# Patient Record
Sex: Female | Born: 1985
Health system: Southern US, Community
[De-identification: ages and names within clinical notes are randomized; demographics above are authoritative.]

## PROBLEM LIST (undated history)

## (undated) DIAGNOSIS — E039 Hypothyroidism, unspecified: Secondary | ICD-10-CM

## (undated) DIAGNOSIS — I10 Essential (primary) hypertension: Secondary | ICD-10-CM

## (undated) DIAGNOSIS — T7840XA Allergy, unspecified, initial encounter: Secondary | ICD-10-CM

## (undated) DIAGNOSIS — E785 Hyperlipidemia, unspecified: Secondary | ICD-10-CM

## (undated) DIAGNOSIS — E119 Type 2 diabetes mellitus without complications: Secondary | ICD-10-CM

## (undated) DIAGNOSIS — Z8619 Personal history of other infectious and parasitic diseases: Secondary | ICD-10-CM

## (undated) DIAGNOSIS — D134 Benign neoplasm of liver: Secondary | ICD-10-CM

## (undated) HISTORY — DX: Hyperlipidemia, unspecified: E78.5

## (undated) HISTORY — DX: Allergy, unspecified, initial encounter: T78.40XA

## (undated) HISTORY — DX: Personal history of other infectious and parasitic diseases: Z86.19

## (undated) HISTORY — DX: Benign neoplasm of liver: D13.4

## (undated) HISTORY — DX: Hypothyroidism, unspecified: E03.9

---
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
## 2012-08-03 ENCOUNTER — Ambulatory Visit (INDEPENDENT_AMBULATORY_CARE_PROVIDER_SITE_OTHER): Payer: 59 | Admitting: Family

## 2012-08-03 ENCOUNTER — Other Ambulatory Visit: Payer: Self-pay | Admitting: Family

## 2012-08-03 ENCOUNTER — Encounter: Payer: Self-pay | Admitting: Family

## 2012-08-03 VITALS — BP 110/90 | HR 106 | Temp 98.9°F | Resp 18 | Ht 67.5 in | Wt 273.1 lb

## 2012-08-03 DIAGNOSIS — R030 Elevated blood-pressure reading, without diagnosis of hypertension: Secondary | ICD-10-CM

## 2012-08-03 DIAGNOSIS — E282 Polycystic ovarian syndrome: Secondary | ICD-10-CM | POA: Insufficient documentation

## 2012-08-03 DIAGNOSIS — G43909 Migraine, unspecified, not intractable, without status migrainosus: Secondary | ICD-10-CM | POA: Insufficient documentation

## 2012-08-03 DIAGNOSIS — I10 Essential (primary) hypertension: Secondary | ICD-10-CM | POA: Insufficient documentation

## 2012-08-03 DIAGNOSIS — IMO0001 Reserved for inherently not codable concepts without codable children: Secondary | ICD-10-CM

## 2012-08-03 DIAGNOSIS — E785 Hyperlipidemia, unspecified: Secondary | ICD-10-CM

## 2012-08-03 DIAGNOSIS — Z0000 Encounter for general adult medical examination without abnormal findings: Secondary | ICD-10-CM

## 2012-08-03 DIAGNOSIS — R635 Abnormal weight gain: Secondary | ICD-10-CM

## 2012-08-03 LAB — BASIC METABOLIC PANEL
BUN: 11 mg/dL (ref 6–23)
Calcium: 10 mg/dL (ref 8.4–10.5)
Glucose, Bld: 107 mg/dL — ABNORMAL HIGH (ref 70–99)
Sodium: 139 mEq/L (ref 135–145)

## 2012-08-03 LAB — HEPATIC FUNCTION PANEL
AST: 23 U/L (ref 0–37)
Alkaline Phosphatase: 87 U/L (ref 39–117)
Bilirubin, Direct: 0.1 mg/dL (ref 0.0–0.3)
Total Bilirubin: 0.5 mg/dL (ref 0.3–1.2)

## 2012-08-03 LAB — TSH: TSH: 5.466 u[IU]/mL — ABNORMAL HIGH (ref 0.350–4.500)

## 2012-08-03 MED ORDER — FLUTICASONE PROPIONATE 50 MCG/ACT NA SUSP: 2.0000 | Freq: Every day | NASAL | Status: DC

## 2012-08-03 NOTE — Progress Notes (Signed)
Subjective:    Patient ID: Kristy Chung, female    DOB: [birth year], 27 y.o.   MRN: [id number]  HPI  Kristy Chung is a 27 yr old female who presents today to establish care.  Migraine- occasional migraines. Generally stress induced and relieved by excedrin and sleep helps.   Hyperlipidemia- She is fasting.  Hx of hyperlipidemia.  Obesity- BMI is 42.  She has gained 22 pounds in the last year.  She reports that in HS she has pcos. She see [facility],  She weighed 170 when she graduated HS.  She had thryoid checked last year and it was normal.  OB also did A1C last year.   Elevated BP- 120-130/upper 80's.    PCOS- told that she had PCOS.     Review of Systems  Constitutional: Positive for unexpected weight change.  HENT: Negative for hearing loss.   Eyes: Negative for visual disturbance.  Respiratory:       Reports rhinorrhea.  No improvement with otc zyrtec  Cardiovascular: Negative for leg swelling.  Gastrointestinal: Negative for vomiting, diarrhea and constipation.  Genitourinary: Negative for dysuria and frequency.  Musculoskeletal:       Some right wrist pain occasionally  Skin: Negative for rash.  Neurological: Positive for headaches.  Hematological: Negative for adenopathy.  Psychiatric/Behavioral:       Denies depression/anxiety       Past Medical History  Diagnosis Date  . History of chicken pox   . Allergy   . Hyperlipidemia   . History of migraine   . Polycystic ovarian syndrome     History   Social History  . Marital Status: Single    Spouse Name: N/A    Number of Children: N/A  . Years of Education: N/A   Occupational History  . Not on file.   Social History Main Topics  . Smoking status: Never Smoker   . Smokeless tobacco: Never Used  . Alcohol Use: Yes     Comment: occasional  . Drug Use: Not on file  . Sexually Active: Not on file   Other Topics Concern  . 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 Wisdom tooth extraction  2005    Family History  Problem Relation Age of Onset  . Hyperlipidemia Mother   . Depression Mother   . Hyperlipidemia Father   . Hypertension Father   . Hypothyroidism Maternal Grandmother   . Heart disease Maternal Grandfather     MI, stent, CAD  . Hypertension Maternal Grandfather   . Hyperlipidemia Maternal Grandfather   . COPD Paternal Grandmother   . Hypertension Paternal Grandmother   . Hyperlipidemia Paternal Grandmother   . AAA (abdominal aortic aneurysm) Paternal Grandfather   . Dementia Paternal Grandfather   . Heart disease Paternal Grandfather     Allergies  Allergen Reactions  . Other     CINNAMON GUM--blisters.    No current outpatient prescriptions on file prior to visit.   No current facility-administered medications on file prior to visit.    BP 110/90  Pulse 106  Temp(Src) 98.9 F (37.2 C) (Oral)  Resp 18  Ht 5' 7.5" (1.715 m)  Wt 273 lb 1.9 oz (  123.886 kg)  BMI 42.12 kg/m2  SpO2 99%  LMP 07/29/2012    Objective:   Physical Exam  Constitutional: She is oriented to person, place, and time. She appears well-developed and well-nourished.  Morbid obese white female, pleasant and in NAD  HENT:  Head: Normocephalic and atraumatic.  Cardiovascular: Normal rate and regular rhythm.   No murmur heard. Pulmonary/Chest: Effort normal and breath sounds normal. No respiratory distress. She has no wheezes. She has no rales. She exhibits no tenderness.  Musculoskeletal: She exhibits no edema.  Lymphadenopathy:    She has no cervical adenopathy.  Neurological: She is alert and oriented to person, place, and time.  Skin: Skin is warm and dry.  Small dark lesion noted on left breast Raised tag like skin lesion in gluteal fold with  dark center.   Psychiatric: She has a normal mood and affect. Her behavior is normal. Judgment and thought content normal.          Assessment & Plan:

## 2012-08-03 NOTE — Patient Instructions (Addendum)
Please complete your lab work prior to leaving.  Schedule mole removal at front desk. Welcome to [facility]!

## 2012-08-03 NOTE — Assessment & Plan Note (Signed)
BP is upper limit normal per report. SBP 90 today.  I recommended that the pt work hard on low sodium diet, weight loss. Will continue to monitor.

## 2012-08-03 NOTE — Assessment & Plan Note (Signed)
Obtain flp  

## 2012-08-03 NOTE — Assessment & Plan Note (Signed)
Will obtain A1C.   

## 2012-08-03 NOTE — Assessment & Plan Note (Signed)
Hx of migraines, now well controlled.  Monitor.

## 2012-08-03 NOTE — Assessment & Plan Note (Addendum)
We discussed healthy diet, exercise, weight loss.   Goal calories <1500 a day with regular exercise. She has had significant weight gain in the last 1 year.  Will also obtain TSH.

## 2012-08-09 ENCOUNTER — Telehealth: Payer: Self-pay | Admitting: Family

## 2012-08-09 DIAGNOSIS — E039 Hypothyroidism, unspecified: Secondary | ICD-10-CM

## 2012-08-09 MED ORDER — LEVOTHYROXINE SODIUM 25 MCG PO TABS: 25.0000 ug | ORAL_TABLET | Freq: Every day | ORAL | Status: DC

## 2012-08-09 MED ORDER — METFORMIN HCL 500 MG PO TABS: 500.0000 mg | ORAL_TABLET | Freq: Two times a day (BID) | ORAL | Status: DC

## 2012-08-09 NOTE — Telephone Encounter (Signed)
Pls call pt and let her know that her A1C is showing borderline diabetes at 6.3.  I would like her to resume metformin bid.  Also, TSH elevated.  Shows subclinical hypothyroid. Given recent weight gain, will rx synthroid.  She should repeat TSH in 6 weeks. (dx hypothyroid).  Cholesterol and trigs elevated. She should avoid concentrated sweets and work on exercise/weight loss.

## 2012-08-10 MED ORDER — TRUERESULT BLOOD GLUCOSE W/DEVICE KIT
PACK | Status: DC
Start: 2012-08-10 — End: 2017-09-21

## 2012-08-10 MED ORDER — LANCETS MISC: Status: DC

## 2012-08-10 MED ORDER — GLUCOSE BLOOD VI STRP: ORAL_STRIP | Status: DC

## 2012-08-10 NOTE — Telephone Encounter (Signed)
Pt left message wanting to know if and how often she should check her blood sugar?  Please advise.

## 2012-08-10 NOTE — Telephone Encounter (Signed)
Notified pt and she voices understanding. Requests rx for glucometer, test strips and lancets. Rxs sent.

## 2012-08-10 NOTE — Telephone Encounter (Signed)
She should check sugars once daily at different times of the day.  Goal AM fasting sugar is 80-110.  Goal after meals is <140.

## 2012-08-18 ENCOUNTER — Encounter: Payer: Self-pay | Admitting: Family

## 2012-08-18 ENCOUNTER — Ambulatory Visit (INDEPENDENT_AMBULATORY_CARE_PROVIDER_SITE_OTHER): Payer: 59 | Admitting: Family

## 2012-08-18 VITALS — BP 126/78 | HR 98 | Temp 99.1°F | Resp 16 | Wt 273.1 lb

## 2012-08-18 DIAGNOSIS — L988 Other specified disorders of the skin and subcutaneous tissue: Secondary | ICD-10-CM

## 2012-08-18 DIAGNOSIS — D239 Other benign neoplasm of skin, unspecified: Secondary | ICD-10-CM

## 2012-08-18 DIAGNOSIS — N649 Disorder of breast, unspecified: Secondary | ICD-10-CM

## 2012-08-18 DIAGNOSIS — L989 Disorder of the skin and subcutaneous tissue, unspecified: Secondary | ICD-10-CM

## 2012-08-18 DIAGNOSIS — D229 Melanocytic nevi, unspecified: Secondary | ICD-10-CM

## 2012-08-18 NOTE — Progress Notes (Signed)
Subjective:    Patient ID: Kristy Chung, female    DOB: 1985-07-12, 27 y.o.   MRN: [id number]  HPI   Kristy Chung is a 27 yr old female who presents today for removal of a mole on her left breast and a mole in the gluteal fold.   Review of Systems See HPI  Past Medical History  Diagnosis Date  . History of chicken pox   . Allergy   . Hyperlipidemia   . History of migraine   . Polycystic ovarian syndrome     History   Social History  . Marital Status: Single    Spouse Name: N/A    Number of Children: N/A  . Years of Education: N/A   Occupational History  . Not on file.   Social History Main Topics  . Smoking status: Never Smoker   . Smokeless tobacco: Never Used  . Alcohol Use: Yes     Comment: occasional  . Drug Use: Not on file  . Sexually Active: Not on file   Other Topics Concern  . 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 Wisdom tooth extraction  2005    Family History  Problem Relation Age of Onset  . Hyperlipidemia Mother   . Depression Mother   . Hyperlipidemia Father   . Hypertension Father   . Hypothyroidism Maternal Grandmother   . Heart disease Maternal Grandfather     MI, stent, CAD  . Hypertension Maternal Grandfather   . Hyperlipidemia Maternal Grandfather   . COPD Paternal Grandmother   . Hypertension Paternal Grandmother   . Hyperlipidemia Paternal Grandmother   . AAA (abdominal aortic aneurysm) Paternal Grandfather   . Dementia Paternal Grandfather   . Heart disease Paternal Grandfather     Allergies  Allergen Reactions  . Other     CINNAMON GUM--blisters.    Current Outpatient Prescriptions on File Prior to Visit  Medication Sig Dispense Refill  . Blood Glucose Monitoring Suppl (TRUERESULT BLOOD GLUCOSE)  W/DEVICE KIT Use as directed to check blood sugar  DX 790.29  1 each  0  . fluticasone (FLONASE) 50 MCG/ACT nasal spray Place 2 sprays into the nose daily.  16 g  6  . glucose blood test strip TrueResult test strips  Use as instructed once a day to check blood sugar DX 790.29  50 each  2  . Lancets MISC Use to check blood sugar once a day with TrueResult meter.  DX 790.29  100 each  2  . levonorgestrel-ethinyl estradiol (NORDETTE) 0.15-30 MG-MCG tablet Take 1 tablet by mouth daily.      Marland Kitchen levothyroxine (SYNTHROID, LEVOTHROID) 25 MCG tablet Take 1 tablet (25 mcg total) by mouth daily.  30 tablet  1  . Melatonin 5 MG TABS Take 1 tablet by mouth at bedtime as needed.      . metFORMIN (GLUCOPHAGE) 500 MG tablet Take 1 tablet (500 mg total) by mouth 2 (two) times daily with a meal.  60 tablet  3  . Multiple Vitamin (MULTIVITAMIN) tablet Take 1 tablet by mouth daily.      . Omega-3 Fatty Acids (FISH OIL) 300 MG CAPS Take 300 mg by mouth daily.      Marland Kitchen  vitamin B-12 (CYANOCOBALAMIN) 1000 MCG tablet Take 1,000 mcg by mouth daily.       No current facility-administered medications on file prior to visit.    BP 126/78  Pulse 98  Temp(Src) 99.1 F (37.3 C) (Oral)  Resp 16  Wt 273 lb 1.9 oz (123.886 kg)  BMI 42.12 kg/m2  SpO2 99%  LMP 07/29/2012       Objective:   Physical Exam  Constitutional: She appears well-developed. No distress.  Skin:  Flat 2 mm wide dark mole noted left breast above areola at 11 oclock.    Raise skin colored mole in gluteal fold with dark hyperpigmented center.   Psychiatric: She has a normal mood and affect. Her behavior is normal. Judgment and thought content normal.          Assessment & Plan:

## 2012-08-18 NOTE — Assessment & Plan Note (Signed)
Procedure including risks/benefits explained to patient.  Questions were answered. After informed consent was obtained, left breast lesion was cleansed with betadine and then alcohol. 1% Lidocaine with epinephrine was injected under lesion and then shave biopsy was performed. Area was cauterized to obtain hemostasis.  Same repeated for successful removal of nevus in gluteal fold. Pt tolerated procedure well.  Specimen sent for pathology review.  Pt instructed to keep the area dry for 24 hours and to contact us if he develops redness, drainage or swelling at the site.

## 2012-08-18 NOTE — Patient Instructions (Addendum)
Please keep area dry today. Call if redness or drainage occurs. We will contact you with your results.

## 2012-08-22 ENCOUNTER — Telehealth: Payer: Self-pay | Admitting: Family

## 2012-08-22 DIAGNOSIS — D239 Other benign neoplasm of skin, unspecified: Secondary | ICD-10-CM

## 2012-08-22 NOTE — Telephone Encounter (Signed)
Results reviewed with patient.  Will refer to dermatology for further evaluation.

## 2012-08-22 NOTE — Telephone Encounter (Signed)
Reviewed Path report from Mole removal.  Left message for pt to return my call.

## 2012-09-01 ENCOUNTER — Encounter: Payer: Self-pay | Admitting: Family

## 2012-09-04 ENCOUNTER — Ambulatory Visit (INDEPENDENT_AMBULATORY_CARE_PROVIDER_SITE_OTHER): Payer: 59 | Admitting: Family

## 2012-09-04 ENCOUNTER — Encounter: Payer: Self-pay | Admitting: Family

## 2012-09-04 VITALS — BP 124/84 | HR 95 | Temp 98.1°F | Resp 16 | Wt 275.1 lb

## 2012-09-04 DIAGNOSIS — N61 Mastitis without abscess: Secondary | ICD-10-CM

## 2012-09-04 MED ORDER — DOXYCYCLINE HYCLATE 100 MG PO TABS: 100.0000 mg | ORAL_TABLET | Freq: Two times a day (BID) | ORAL | Status: DC

## 2012-09-04 NOTE — Assessment & Plan Note (Signed)
Will rx with doxy for empiric MRSA coverage.  Pt instructed to monitor area and contact if increased pain, swelling redness.  Follow up in week.

## 2012-09-04 NOTE — Progress Notes (Signed)
Subjective:    Patient ID: Kristy Chung, female    DOB: [birth year], 27 y.o.   MRN: [id number]  HPI  Kristy Chung is a 27 yr old female who presents today to discuss ?cellulitis of the left breast. She initially underwent biopsy on 4/4.  Path showed dysplastic nevus with atypia. She was subsequently referred to dermatology for a wider excision.  She saw Dr. Vernell Barrier who performed wide excision last Monday.  This past Saturday she developed purulent drainage and has now developed redness/swelling and fullness of the left breast. Denies associated fever.    Review of Systems See HPI  Past Medical History  Diagnosis Date  . History of chicken pox   . Allergy   . Hyperlipidemia   . History of migraine   . Polycystic ovarian syndrome     History   Social History  . Marital Status: Single    Spouse Name: N/A    Number of Children: N/A  . Years of Education: N/A   Occupational History  . Not on file.   Social History Main Topics  . Smoking status: Never Smoker   . Smokeless tobacco: Never Used  . Alcohol Use: Yes     Comment: occasional  . Drug Use: Not on file  . Sexually Active: Not on file   Other Topics Concern  . 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 Wisdom tooth extraction  2005    Family History  Problem Relation Age of Onset  . Hyperlipidemia Mother   . Depression Mother   . Hyperlipidemia Father   . Hypertension Father   . Hypothyroidism Maternal Grandmother   . Heart disease Maternal Grandfather     MI, stent, CAD  . Hypertension Maternal Grandfather   . Hyperlipidemia Maternal Grandfather   . COPD Paternal Grandmother   . Hypertension Paternal Grandmother   . Hyperlipidemia Paternal Grandmother   . AAA (abdominal aortic aneurysm)  Paternal Grandfather   . Dementia Paternal Grandfather   . Heart disease Paternal Grandfather     Allergies  Allergen Reactions  . Other     CINNAMON GUM--blisters.    Current Outpatient Prescriptions on File Prior to Visit  Medication Sig Dispense Refill  . Blood Glucose Monitoring Suppl (TRUERESULT BLOOD GLUCOSE) W/DEVICE KIT Use as directed to check blood sugar  DX 790.29  1 each  0  . fluticasone (FLONASE) 50 MCG/ACT nasal spray Place 2 sprays into the nose daily.  16 g  6  . glucose blood test strip TrueResult test strips  Use as instructed once a day to check blood sugar DX 790.29  50 each  2  . Lancets MISC Use to check blood sugar once a day with TrueResult meter.  DX 790.29  100 each  2  . levonorgestrel-ethinyl estradiol (NORDETTE) 0.15-30 MG-MCG tablet Take 1 tablet by mouth daily.      Marland Kitchen levothyroxine (SYNTHROID, LEVOTHROID) 25 MCG tablet Take 1 tablet (25 mcg total) by mouth daily.  30 tablet  1  . Melatonin 5 MG TABS Take 1 tablet by mouth at bedtime as needed.      . metFORMIN (GLUCOPHAGE) 500 MG tablet Take 1 tablet (500 mg total) by mouth 2 (two) times  daily with a meal.  60 tablet  3  . Multiple Vitamin (MULTIVITAMIN) tablet Take 1 tablet by mouth daily.      . Omega-3 Fatty Acids (FISH OIL) 300 MG CAPS Take 300 mg by mouth daily.      . vitamin B-12 (CYANOCOBALAMIN) 1000 MCG tablet Take 1,000 mcg by mouth daily.       No current facility-administered medications on file prior to visit.    BP 124/84  Pulse 95  Temp(Src) 98.1 F (36.7 C) (Oral)  Resp 16  Wt 275 lb 1.3 oz (124.775 kg)  BMI 42.42 kg/m2  SpO2 99%       Objective:   Physical Exam  Constitutional: She is oriented to person, place, and time. She appears well-developed and well-nourished. No distress.  HENT:  Head: Normocephalic and atraumatic.  Pulmonary/Chest: Effort normal.  Neurological: She is alert and oriented to person, place, and time.  Skin:  Left breast ulcerated lesion with yellow  base, no odor.  + associated erythema and tenderness surrounding lesion- approximately 2 inches in diameter.    Psychiatric: She has a normal mood and affect. Her behavior is normal. Judgment and thought content normal.          Assessment & Plan:

## 2012-09-04 NOTE — Patient Instructions (Addendum)
Please call if symptoms worsen, if you develop fever or if not improved in 2-3 days.

## 2012-09-04 NOTE — Telephone Encounter (Signed)
Pt was seen in the office today and stated swelling and discharged has cleared up. Requested that we discard previous message.

## 2012-09-11 ENCOUNTER — Encounter: Payer: Self-pay | Admitting: Family

## 2012-09-11 ENCOUNTER — Ambulatory Visit (INDEPENDENT_AMBULATORY_CARE_PROVIDER_SITE_OTHER): Payer: 59 | Admitting: Family

## 2012-09-11 VITALS — BP 134/80 | HR 83 | Temp 98.3°F | Resp 16 | Ht 67.5 in | Wt 274.0 lb

## 2012-09-11 DIAGNOSIS — N61 Mastitis without abscess: Secondary | ICD-10-CM

## 2012-09-11 NOTE — Patient Instructions (Addendum)
Please call if wound does not continue to improve or if you develop recurrent pain or redness.

## 2012-09-11 NOTE — Assessment & Plan Note (Signed)
Resolved.  Plan to complete doxy.  Pt to call if wound does not continue to heal or if recurrent erythema.

## 2012-09-11 NOTE — Progress Notes (Signed)
Subjective:    Patient ID: Kristy Chung, female    DOB: 1985-06-13, 27 y.o.   MRN: [id number]  HPI  Kristy Chung is a 27 yr old female who presents today for 1 week follow up of her left breast cellulitis.  Last visit she presented with pain/redness of the left breast following a wide excision of a dysplastic nevus by dermatology.  She was started on doxycycline. She tells me that dermatology called her to notify her that her biopsy was "clean."  Review of Systems See HPI  Past Medical History  Diagnosis Date  . History of chicken pox   . Allergy   . Hyperlipidemia   . History of migraine   . Polycystic ovarian syndrome     History   Social History  . Marital Status: Single    Spouse Name: N/A    Number of Children: N/A  . Years of Education: N/A   Occupational History  . Not on file.   Social History Main Topics  . Smoking status: Never Smoker   . Smokeless tobacco: Never Used  . Alcohol Use: Yes     Comment: occasional  . Drug Use: Not on file  . Sexually Active: Not on file   Other Topics Concern  . 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 Wisdom tooth extraction  2005    Family History  Problem Relation Age of Onset  . Hyperlipidemia Mother   . Depression Mother   . Hyperlipidemia Father   . Hypertension Father   . Hypothyroidism Maternal Grandmother   . Heart disease Maternal Grandfather     MI, stent, CAD  . Hypertension Maternal Grandfather   . Hyperlipidemia Maternal Grandfather   . COPD Paternal Grandmother   . Hypertension Paternal Grandmother   . Hyperlipidemia Paternal Grandmother   . AAA (abdominal aortic aneurysm) Paternal Grandfather   . Dementia Paternal Grandfather   . Heart disease Paternal Grandfather     Allergies   Allergen Reactions  . Other     CINNAMON GUM--blisters.    Current Outpatient Prescriptions on File Prior to Visit  Medication Sig Dispense Refill  . Blood Glucose Monitoring Suppl (TRUERESULT BLOOD GLUCOSE) W/DEVICE KIT Use as directed to check blood sugar  DX 790.29  1 each  0  . doxycycline (VIBRA-TABS) 100 MG tablet Take 1 tablet (100 mg total) by mouth 2 (two) times daily.  20 tablet  0  . fluticasone (FLONASE) 50 MCG/ACT nasal spray Place 2 sprays into the nose daily.  16 g  6  . glucose blood test strip TrueResult test strips  Use as instructed once a day to check blood sugar DX 790.29  50 each  2  . Lancets MISC Use to check blood sugar once a day with TrueResult meter.  DX 790.29  100 each  2  . levonorgestrel-ethinyl estradiol (NORDETTE) 0.15-30 MG-MCG tablet Take 1 tablet by mouth daily.      Marland Kitchen levothyroxine (SYNTHROID, LEVOTHROID) 25 MCG tablet Take 1 tablet (25 mcg total) by mouth daily.  30 tablet  1  . Melatonin 5 MG TABS Take 1 tablet by mouth at bedtime as needed.      . metFORMIN (GLUCOPHAGE) 500 MG tablet  Take 1 tablet (500 mg total) by mouth 2 (two) times daily with a meal.  60 tablet  3  . Multiple Vitamin (MULTIVITAMIN) tablet Take 1 tablet by mouth daily.      . Omega-3 Fatty Acids (FISH OIL) 300 MG CAPS Take 300 mg by mouth daily.      . vitamin B-12 (CYANOCOBALAMIN) 1000 MCG tablet Take 1,000 mcg by mouth daily.       No current facility-administered medications on file prior to visit.    Ht 5' 7.5" (1.715 m)  Wt 274 lb (124.286 kg)  BMI 42.26 kg/m2       Objective:   Physical Exam  Constitutional: She appears well-developed and well-nourished. No distress.  Pulmonary/Chest: Effort normal.  Skin: Skin is warm and dry.  Shallow ulcerated lesion left breast at biopsy site.  No surrounding erythema is noted.  No drainage, no odor.           Assessment & Plan:

## 2012-09-23 ENCOUNTER — Encounter: Payer: Self-pay | Admitting: Family

## 2012-10-13 LAB — TSH: TSH: 4.803 u[IU]/mL — ABNORMAL HIGH (ref 0.350–4.500)

## 2012-10-14 ENCOUNTER — Telehealth: Payer: Self-pay | Admitting: Family

## 2012-10-14 DIAGNOSIS — E039 Hypothyroidism, unspecified: Secondary | ICD-10-CM

## 2012-10-14 MED ORDER — LEVOTHYROXINE SODIUM 50 MCG PO TABS
50.0000 ug | ORAL_TABLET | Freq: Every day | ORAL | Status: DC
Start: 2012-10-14 — End: 2012-11-30

## 2012-10-14 NOTE — Telephone Encounter (Signed)
Please arrange TSH in 6 weeks, dx hypothyroid.

## 2012-10-17 NOTE — Telephone Encounter (Signed)
Called pt and left message to call to schedule lab appt. JLT

## 2012-10-19 NOTE — Telephone Encounter (Signed)
Spoke with pt. She is aware to return for labs in 6 weeks.

## 2012-11-30 ENCOUNTER — Other Ambulatory Visit: Payer: Self-pay | Admitting: Family

## 2012-11-30 ENCOUNTER — Encounter: Payer: Self-pay | Admitting: Family

## 2012-11-30 LAB — TSH: TSH: 1.966 u[IU]/mL (ref 0.350–4.500)

## 2012-11-30 MED ORDER — LEVOTHYROXINE SODIUM 50 MCG PO TABS: 50.0000 ug | ORAL_TABLET | Freq: Every day | ORAL | Status: DC

## 2013-02-12 ENCOUNTER — Telehealth: Payer: Self-pay | Admitting: *Deleted

## 2013-02-12 MED ORDER — METFORMIN HCL 500 MG PO TABS: 500.0000 mg | ORAL_TABLET | Freq: Two times a day (BID) | ORAL | Status: DC

## 2013-02-12 NOTE — Telephone Encounter (Signed)
Received message from pt requesting refills on levothyroxine and metformin. Refill sent for metformin. Should still have refills on file for levothyroxine through January. Confirmed with pharmacy. Metformin refills sent. Notified pt.

## 2013-03-22 ENCOUNTER — Other Ambulatory Visit: Payer: Self-pay

## 2013-08-27 ENCOUNTER — Telehealth: Payer: Self-pay | Admitting: *Deleted

## 2013-08-27 MED ORDER — LEVOTHYROXINE SODIUM 50 MCG PO TABS: 50.0000 ug | ORAL_TABLET | Freq: Every day | ORAL | Status: DC

## 2013-08-27 NOTE — Telephone Encounter (Signed)
Received message from pt requesting refill of levothyroxine. Pt also requests BP and lipid check.  Left detailed message on cell# that 30 day supply of levothyroxine has been sent to her pharmacy and she is due for a follow up in the office and can be fasting to discuss order for lipid panel.  Please call pt to arrange appt if she has not called back by tomorrow.

## 2013-08-30 NOTE — Telephone Encounter (Signed)
Appointment scheduled for 09/03/13

## 2013-09-03 ENCOUNTER — Ambulatory Visit (INDEPENDENT_AMBULATORY_CARE_PROVIDER_SITE_OTHER): Payer: 59 | Admitting: Family

## 2013-09-03 ENCOUNTER — Encounter: Payer: Self-pay | Admitting: Family

## 2013-09-03 ENCOUNTER — Other Ambulatory Visit: Payer: Self-pay | Admitting: Family

## 2013-09-03 VITALS — BP 140/94 | HR 78 | Temp 98.5°F | Resp 16 | Ht 67.5 in | Wt 279.1 lb

## 2013-09-03 DIAGNOSIS — E785 Hyperlipidemia, unspecified: Secondary | ICD-10-CM

## 2013-09-03 DIAGNOSIS — R5383 Other fatigue: Principal | ICD-10-CM

## 2013-09-03 DIAGNOSIS — IMO0001 Reserved for inherently not codable concepts without codable children: Secondary | ICD-10-CM

## 2013-09-03 DIAGNOSIS — R7309 Other abnormal glucose: Secondary | ICD-10-CM

## 2013-09-03 DIAGNOSIS — G43909 Migraine, unspecified, not intractable, without status migrainosus: Secondary | ICD-10-CM

## 2013-09-03 DIAGNOSIS — R5381 Other malaise: Secondary | ICD-10-CM

## 2013-09-03 DIAGNOSIS — R030 Elevated blood-pressure reading, without diagnosis of hypertension: Secondary | ICD-10-CM

## 2013-09-03 DIAGNOSIS — E282 Polycystic ovarian syndrome: Secondary | ICD-10-CM

## 2013-09-03 DIAGNOSIS — R739 Hyperglycemia, unspecified: Secondary | ICD-10-CM

## 2013-09-03 LAB — CBC WITH DIFFERENTIAL/PLATELET
Basophils Absolute: 0 10*3/uL (ref 0.0–0.1)
Basophils Relative: 0 % (ref 0–1)
EOS PCT: 2 % (ref 0–5)
Eosinophils Absolute: 0.2 10*3/uL (ref 0.0–0.7)
HCT: 39.9 % (ref 36.0–46.0)
HEMOGLOBIN: 13.7 g/dL (ref 12.0–15.0)
LYMPHS PCT: 36 % (ref 12–46)
Lymphs Abs: 3 10*3/uL (ref 0.7–4.0)
MCH: 28.1 pg (ref 26.0–34.0)
MCHC: 34.3 g/dL (ref 30.0–36.0)
MCV: 81.9 fL (ref 78.0–100.0)
MONO ABS: 0.3 10*3/uL (ref 0.1–1.0)
MONOS PCT: 4 % (ref 3–12)
NEUTROS ABS: 4.8 10*3/uL (ref 1.7–7.7)
Neutrophils Relative %: 58 % (ref 43–77)
Platelets: 280 10*3/uL (ref 150–400)
RBC: 4.87 MIL/uL (ref 3.87–5.11)
RDW: 14.1 % (ref 11.5–15.5)
WBC: 8.2 10*3/uL (ref 4.0–10.5)

## 2013-09-03 LAB — HEPATIC FUNCTION PANEL
ALBUMIN: 4.2 g/dL (ref 3.5–5.2)
ALT: 20 U/L (ref 0–35)
AST: 19 U/L (ref 0–37)
Alkaline Phosphatase: 68 U/L (ref 39–117)
Bilirubin, Direct: 0.1 mg/dL (ref 0.0–0.3)
Indirect Bilirubin: 0.2 mg/dL (ref 0.2–1.2)
TOTAL PROTEIN: 6.9 g/dL (ref 6.0–8.3)
Total Bilirubin: 0.3 mg/dL (ref 0.2–1.2)

## 2013-09-03 LAB — LIPID PANEL
Cholesterol: 215 mg/dL — ABNORMAL HIGH (ref 0–200)
HDL: 50 mg/dL (ref >39)
LDL Cholesterol: 116 mg/dL — ABNORMAL HIGH (ref 0–99)
TRIGLYCERIDES: 244 mg/dL — AB (ref <150)
Total CHOL/HDL Ratio: 4.3 Ratio
VLDL: 49 mg/dL — AB (ref 0–40)

## 2013-09-03 LAB — BASIC METABOLIC PANEL
BUN: 10 mg/dL (ref 6–23)
CO2: 22 mEq/L (ref 19–32)
CREATININE: 0.47 mg/dL — AB (ref 0.50–1.10)
Calcium: 9.5 mg/dL (ref 8.4–10.5)
Chloride: 104 mEq/L (ref 96–112)
GLUCOSE: 110 mg/dL — AB (ref 70–99)
POTASSIUM: 4.1 meq/L (ref 3.5–5.3)
Sodium: 136 mEq/L (ref 135–145)

## 2013-09-03 LAB — TSH: TSH: 2.781 u[IU]/mL (ref 0.350–4.500)

## 2013-09-03 LAB — HEMOGLOBIN A1C
HEMOGLOBIN A1C: 6.8 % — AB (ref <5.7)
Mean Plasma Glucose: 148 mg/dL — ABNORMAL HIGH (ref <117)

## 2013-09-03 MED ORDER — HYDROCHLOROTHIAZIDE 25 MG PO TABS: 25.0000 mg | ORAL_TABLET | Freq: Every day | ORAL | Status: DC

## 2013-09-03 MED ORDER — FLUTICASONE PROPIONATE 50 MCG/ACT NA SUSP: 2.0000 | Freq: Every day | NASAL | Status: DC

## 2013-09-03 NOTE — Assessment & Plan Note (Signed)
She is maintained on metformin and OCP. Will check A1C.

## 2013-09-03 NOTE — Progress Notes (Signed)
Subjective:    Patient ID: Kristy Chung, female    DOB: 07-22-1985, 28 y.o.   MRN: [id number]  HPI  Kristy Chung is a 28 yr old female who presents today for follow up. Reports that she has been more tired lately.    Hyperlipidemia-  Lab Results  Component Value Date   CHOL 231* 08/03/2012   HDL 45 08/03/2012   LDLCALC 112* 08/03/2012   TRIG 368* 08/03/2012   CHOLHDL 5.1 08/03/2012   Migraines- reports 1-2 migraines in last year- good relief with excedrin.    PCOS- she continues metformin and OCP.  Reports that she sometimes gets "shakey." Has checked her sugar during these episodes and sugar is normal.  Elevated BP-  Reports that she has been checking her blood pressure at work- high 130's or high 80's. Denies CP/SOB or LE Edema.  BP Readings from Last 3 Encounters:  09/03/13 140/94  09/11/12 134/80  09/04/12 124/84   Review of Systems See HPI  Past Medical History  Diagnosis Date  . History of chicken pox   . Allergy   . Hyperlipidemia   . History of migraine   . Polycystic ovarian syndrome     History   Social History  . Marital Status: Single    Spouse Name: N/A    Number of Children: N/A  . Years of Education: N/A   Occupational History  . Not on file.   Social History Main Topics  . Smoking status: Never Smoker   . Smokeless tobacco: Never Used  . Alcohol Use: Yes     Comment: occasional  . Drug Use: Not on file  . Sexual Activity: Not on file   Other Topics Concern  . 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 Wisdom tooth extraction  2005    Family History  Problem Relation Age of Onset  . Hyperlipidemia Mother   . Depression Mother   . Hyperlipidemia Father   . Hypertension Father   . Hypothyroidism Maternal Grandmother   .  Heart disease Maternal Grandfather     MI, stent, CAD  . Hypertension Maternal Grandfather   . Hyperlipidemia Maternal Grandfather   . COPD Paternal Grandmother   . Hypertension Paternal Grandmother   . Hyperlipidemia Paternal Grandmother   . AAA (abdominal aortic aneurysm) Paternal Grandfather   . Dementia Paternal Grandfather   . Heart disease Paternal Grandfather     Allergies  Allergen Reactions  . Other     CINNAMON GUM--blisters.    Current Outpatient Prescriptions on File Prior to Visit  Medication Sig Dispense Refill  . Blood Glucose Monitoring Suppl (TRUERESULT BLOOD GLUCOSE) W/DEVICE KIT Use as directed to check blood sugar  DX 790.29  1 each  0  . glucose blood test strip TrueResult test strips  Use as instructed once a day to check blood sugar DX 790.29  50 each  2  . Lancets MISC Use to check blood sugar once a day with TrueResult meter.  DX 790.29  100 each  2  . levonorgestrel-ethinyl estradiol (NORDETTE) 0.15-30 MG-MCG tablet Take 1 tablet by mouth daily.      Marland Kitchen levothyroxine (SYNTHROID, LEVOTHROID) 50 MCG tablet Take 1 tablet (50 mcg total)  by mouth daily.  30 tablet  0  . Melatonin 5 MG TABS Take 1 tablet by mouth at bedtime as needed.      . metFORMIN (GLUCOPHAGE) 500 MG tablet Take 1 tablet (500 mg total) by mouth 2 (two) times daily with a meal.  60 tablet  3  . Multiple Vitamin (MULTIVITAMIN) tablet Take 1 tablet by mouth daily.      . Omega-3 Fatty Acids (FISH OIL) 300 MG CAPS Take 300 mg by mouth daily.      . vitamin B-12 (CYANOCOBALAMIN) 1000 MCG tablet Take 1,000 mcg by mouth daily.       No current facility-administered medications on file prior to visit.    BP 140/94  Pulse 78  Temp(Src) 98.5 F (36.9 C) (Oral)  Resp 16  Ht 5' 7.5" (1.715 m)  Wt 279 lb 1.9 oz (126.608 kg)  BMI 43.05 kg/m2  SpO2 99%  LMP 08/27/2013       Objective:   Physical Exam  Constitutional: She is oriented to person, place, and time. She appears well-developed and  well-nourished. No distress.  HENT:  Head: Normocephalic and atraumatic.  Cardiovascular: Normal rate and regular rhythm.   No murmur heard. Pulmonary/Chest: Effort normal and breath sounds normal. No respiratory distress. She has no wheezes. She has no rales. She exhibits no tenderness.  Neurological: She is alert and oriented to person, place, and time.  Psychiatric: She has a normal mood and affect. Her behavior is normal. Judgment and thought content normal.          Assessment & Plan:

## 2013-09-03 NOTE — Patient Instructions (Signed)
Start hctz, follow up in 2 weeks. Complete lab work prior to leaving.

## 2013-09-03 NOTE — Assessment & Plan Note (Signed)
Stable.  Monitor.  

## 2013-09-03 NOTE — Assessment & Plan Note (Signed)
Will add hctz 25mg  once daily.  Follow up in 2 weeks for BP check and bmet.

## 2013-09-03 NOTE — Assessment & Plan Note (Signed)
Check follow up lipid panel.  

## 2013-09-03 NOTE — Progress Notes (Signed)
Pre visit review using our clinic review tool, if applicable. No additional management support is needed unless otherwise documented below in the visit note. 

## 2013-09-04 ENCOUNTER — Telehealth: Payer: Self-pay | Admitting: Family

## 2013-09-04 DIAGNOSIS — E119 Type 2 diabetes mellitus without complications: Secondary | ICD-10-CM | POA: Insufficient documentation

## 2013-09-04 NOTE — Telephone Encounter (Signed)
Left detailed message on voicemail and to call if any questions. 

## 2013-09-04 NOTE — Telephone Encounter (Signed)
Please contact pt and let her know that her a1C is up to 6.8.  This places her in the "diabetes" category.  I recommend that she continue metformin and work hard on diet/exercise and weight loss.  Thyroid function and blood count look good.  Continue current dose of synthroid.

## 2013-09-04 NOTE — Telephone Encounter (Signed)
Also, cholesterol and triglycerides are elevated.

## 2013-09-20 ENCOUNTER — Other Ambulatory Visit (HOSPITAL_COMMUNITY)
Admission: RE | Admit: 2013-09-20 | Discharge: 2013-09-20 | Disposition: A | Discharge status: Home / Self Care | Payer: 59 | Source: Ambulatory Visit | Attending: Gynecology | Admitting: Gynecology

## 2013-09-20 ENCOUNTER — Encounter: Payer: Self-pay | Admitting: Women's Health

## 2013-09-20 ENCOUNTER — Ambulatory Visit (INDEPENDENT_AMBULATORY_CARE_PROVIDER_SITE_OTHER): Payer: 59 | Admitting: Women's Health

## 2013-09-20 VITALS — BP 116/76 | Ht 67.5 in | Wt 276.0 lb

## 2013-09-20 DIAGNOSIS — Z01419 Encounter for gynecological examination (general) (routine) without abnormal findings: Secondary | ICD-10-CM

## 2013-09-20 DIAGNOSIS — Z309 Encounter for contraceptive management, unspecified: Secondary | ICD-10-CM

## 2013-09-20 DIAGNOSIS — IMO0001 Reserved for inherently not codable concepts without codable children: Secondary | ICD-10-CM

## 2013-09-20 MED ORDER — LEVONORGESTREL-ETHINYL ESTRAD 0.15-30 MG-MCG PO TABS: 1.0000 | ORAL_TABLET | Freq: Every day | ORAL | Status: DC

## 2013-09-20 NOTE — Patient Instructions (Signed)

## 2013-09-20 NOTE — Progress Notes (Signed)
Kristy Chung 07-23-1985 [id number]    History:    Presents for annual exam.  Regular monthly cycle on Levora. Not sexually active for 2 years. Normal Pap history. Gardasil series completed. Hypothyroid on 50 mcg Synthroid. PCOS on metformin. Recently started on HCTZ for hypertension per primary care.  Past medical history, past surgical history, family history and social history were all reviewed and documented in the EPIC chart. 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 Parents hypercholesteremia, father hypertension. Twin siblings healthy.  ROS:  A  12 point ROS was performed and pertinent positives and negatives are included.  Exam:  Filed Vitals:   09/20/13 1544  BP: 116/76    General appearance:  Obese  Thyroid:  Symmetrical, normal in size, without palpable masses or nodularity. Respiratory  Auscultation:  Clear without wheezing or rhonchi Cardiovascular  Auscultation:  Regular rate, without rubs, murmurs or gallops  Edema/varicosities:  Not grossly evident Abdominal  Soft,nontender, without masses, guarding or rebound.  Liver/spleen:  No organomegaly noted  Hernia:  None appreciated  Skin  Inspection:  Grossly normal   Breasts: Examined lying and sitting.     Right: Without masses, retractions, discharge or axillary adenopathy.     Left: Without masses, retractions, discharge or axillary adenopathy. Gentitourinary   Inguinal/mons:  Normal without inguinal adenopathy  External genitalia:  Normal  BUS/Urethra/Skene's glands:  Normal  Vagina:  Normal  Cervix:  Normal  Uterus:   normal in size, shape and contour.  Midline and mobile  Adnexa/parametria:     Rt: Without masses or tenderness.   Lt: Without masses or tenderness.  Anus and perineum: Normal  Digital rectal exam: Normal sphincter tone without palpated masses or tenderness  Assessment/Plan:  28 y.o. SWF G0 for annual exam.     Monthly cycle on Levora PCO S./DM on metformin/Hypothyroid on  Synthroid/Hypertension on HCTZ  - primary care manages labs and meds Obesity  Plan: Levora prescription, proper use, slight risk for blood clots and strokes reviewed. Condoms if become sexually active encouraged. SBE's, increase regular exercise and decrease calories for weight loss, Weight Watchers encouraged. Pap , Pap normal 2014 lacked endocervical cells.    Huel Cote [facility], 4:40 PM 09/20/2013

## 2013-09-21 ENCOUNTER — Ambulatory Visit (INDEPENDENT_AMBULATORY_CARE_PROVIDER_SITE_OTHER): Payer: 59 | Admitting: Family

## 2013-09-21 ENCOUNTER — Encounter: Payer: Self-pay | Admitting: Family

## 2013-09-21 ENCOUNTER — Telehealth: Payer: Self-pay | Admitting: *Deleted

## 2013-09-21 ENCOUNTER — Other Ambulatory Visit: Payer: Self-pay | Admitting: Family

## 2013-09-21 VITALS — BP 120/74 | HR 74 | Temp 98.2°F | Resp 16 | Ht 67.5 in | Wt 280.0 lb

## 2013-09-21 DIAGNOSIS — Z23 Encounter for immunization: Secondary | ICD-10-CM

## 2013-09-21 DIAGNOSIS — R5383 Other fatigue: Principal | ICD-10-CM

## 2013-09-21 DIAGNOSIS — R5381 Other malaise: Secondary | ICD-10-CM

## 2013-09-21 DIAGNOSIS — IMO0001 Reserved for inherently not codable concepts without codable children: Secondary | ICD-10-CM

## 2013-09-21 DIAGNOSIS — E119 Type 2 diabetes mellitus without complications: Secondary | ICD-10-CM

## 2013-09-21 DIAGNOSIS — R030 Elevated blood-pressure reading, without diagnosis of hypertension: Secondary | ICD-10-CM

## 2013-09-21 LAB — BASIC METABOLIC PANEL
BUN: 9 mg/dL (ref 6–23)
CHLORIDE: 105 meq/L (ref 96–112)
CO2: 20 mEq/L (ref 19–32)
CREATININE: 0.53 mg/dL (ref 0.50–1.10)
Calcium: 9.3 mg/dL (ref 8.4–10.5)
Glucose, Bld: 108 mg/dL — ABNORMAL HIGH (ref 70–99)
POTASSIUM: 4.2 meq/L (ref 3.5–5.3)
SODIUM: 138 meq/L (ref 135–145)

## 2013-09-21 NOTE — Progress Notes (Signed)
Subjective:    Patient ID: Kristy Chung, female    DOB: 31-Mar-1986, 28 y.o.   MRN: [id number]  HPI  [name] is a 28 yr old female who presents today for follow up of her blood pressure.  She was last seen on 4/20 and HCTZ was added for blood pressure.  BP Readings from Last 3 Encounters:  09/21/13 120/74  09/20/13 116/76  09/03/13 140/94   DM2- last visit her A1C was noted to be in the diabetic range. She is maintained on metformin due to PCOS. 130-140 post prandial. 115-130's fasting.    Review of Systems See HPI   Past Medical History  Diagnosis Date  . History of chicken pox   . Allergy   . Hyperlipidemia   . History of migraine   . Polycystic ovarian syndrome     History   Social History  . Marital Status: Single    Spouse Name: N/A    Number of Children: N/A  . Years of Education: N/A   Occupational History  . Not on file.   Social History Main Topics  . Smoking status: Never Smoker   . Smokeless tobacco: Never Used  . Alcohol Use: Yes     Comment: occasional  . Drug Use: No  . Sexual Activity: Not Currently   Other Topics Concern  . 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 Wisdom tooth extraction  2005    Family History  Problem Relation Age of Onset  . Hyperlipidemia Mother   . Depression Mother   . Hyperlipidemia Father   . Hypertension Father   . Hypothyroidism Maternal Grandmother   . Heart disease Maternal Grandfather     MI, stent, CAD  . Hypertension Maternal Grandfather   . Hyperlipidemia Maternal Grandfather   . COPD Paternal Grandmother   . Hypertension Paternal Grandmother   . Hyperlipidemia Paternal Grandmother   . Heart disease Paternal Grandmother   . AAA (abdominal aortic aneurysm) Paternal Grandfather     . Dementia Paternal Grandfather   . Heart disease Paternal Grandfather     Allergies  Allergen Reactions  . Other     CINNAMON GUM--blisters.    Current Outpatient Prescriptions on File Prior to Visit  Medication Sig Dispense Refill  . Blood Glucose Monitoring Suppl (TRUERESULT BLOOD GLUCOSE) W/DEVICE KIT Use as directed to check blood sugar  DX 790.29  1 each  0  . fluticasone (FLONASE) 50 MCG/ACT nasal spray Place 2 sprays into both nostrils daily.  16 g  6  . glucose blood test strip TrueResult test strips  Use as instructed once a day to check blood sugar DX 790.29  50 each  2  . hydrochlorothiazide (HYDRODIURIL) 25 MG tablet Take 1 tablet (25 mg total) by mouth daily.  30 tablet  2  . Lancets MISC Use to check blood sugar once a day with TrueResult meter.  DX 790.29  100 each  2  . levonorgestrel-ethinyl estradiol (NORDETTE) 0.15-30 MG-MCG tablet Take 1 tablet by mouth daily.  3 Package  4  . levothyroxine (SYNTHROID, LEVOTHROID) 50 MCG tablet Take 1 tablet (50 mcg total) by mouth daily.  30 tablet  0  . metFORMIN (GLUCOPHAGE) 500 MG  tablet Take 1 tablet (500 mg total) by mouth 2 (two) times daily with a meal.  60 tablet  3  . Multiple Vitamin (MULTIVITAMIN) tablet Take 1 tablet by mouth daily.      . Omega-3 Fatty Acids (FISH OIL) 1000 MG CAPS Take 2,000 mg by mouth 2 (two) times daily.      . vitamin B-12 (CYANOCOBALAMIN) 1000 MCG tablet Take 1,000 mcg by mouth daily.       No current facility-administered medications on file prior to visit.    BP 120/74  Pulse 74  Temp(Src) 98.2 F (36.8 C) (Oral)  Resp 16  Ht 5' 7.5" (1.715 m)  Wt 280 lb 0.6 oz (127.025 kg)  BMI 43.19 kg/m2  SpO2 99%  LMP 08/27/2013       Objective:   Physical Exam  Constitutional: She is oriented to person, place, and time. She appears well-developed and well-nourished. No distress.  HENT:  Head: Normocephalic and atraumatic.  Cardiovascular: Normal rate and regular rhythm.   No murmur  heard. Pulmonary/Chest: Effort normal and breath sounds normal. No respiratory distress. She has no wheezes. She has no rales. She exhibits no tenderness.  Neurological: She is alert and oriented to person, place, and time.  Psychiatric: She has a normal mood and affect. Her behavior is normal. Judgment and thought content normal.          Assessment & Plan:

## 2013-09-21 NOTE — Patient Instructions (Signed)
Please complete lab work prior to leaving. Work on diabetic diet, exercise, weight loss. Follow up in 3 months, sooner if problems/concerns.

## 2013-09-21 NOTE — Progress Notes (Signed)
Pre visit review using our clinic review tool, if applicable. No additional management support is needed unless otherwise documented below in the visit note. 

## 2013-09-21 NOTE — Telephone Encounter (Signed)
Pt came in to the office for follow up of BP and Bmet but was not on the schedule. Advised pt we could see her in our 8:45am opening and she is agreeable. Lab order entered.

## 2013-09-22 ENCOUNTER — Encounter: Payer: Self-pay | Admitting: Family

## 2013-09-22 LAB — VITAMIN D 25 HYDROXY (VIT D DEFICIENCY, FRACTURES): Vit D, 25-Hydroxy: 52 ng/mL (ref 30–89)

## 2013-09-22 LAB — MICROALBUMIN / CREATININE URINE RATIO
Creatinine, Urine: 123.4 mg/dL
Microalb Creat Ratio: 5.1 mg/g (ref 0.0–30.0)
Microalb, Ur: 0.63 mg/dL (ref 0.00–1.89)

## 2013-09-22 NOTE — Assessment & Plan Note (Signed)
Clinically stable on metformin. Discussed importance of foot care, eye care, diabetic diet, exercise and weight loss.  Pneumovax today, check urine microalbumin.

## 2013-09-22 NOTE — Assessment & Plan Note (Signed)
BP improved on hctz, obtain bmet.

## 2013-09-22 NOTE — Assessment & Plan Note (Signed)
Check vit d level 

## 2013-11-09 ENCOUNTER — Other Ambulatory Visit: Payer: Self-pay | Admitting: Family

## 2013-11-19 ENCOUNTER — Other Ambulatory Visit: Payer: Self-pay | Admitting: Family

## 2013-11-28 ENCOUNTER — Telehealth: Payer: Self-pay

## 2013-11-28 DIAGNOSIS — E785 Hyperlipidemia, unspecified: Secondary | ICD-10-CM

## 2013-11-28 NOTE — Telephone Encounter (Signed)
Diabetic bundle:  mychart message sent to pt asking her to come in for her LDL to be checked on 12-04-13 or shortly after.  Labs ordered

## 2013-12-19 ENCOUNTER — Other Ambulatory Visit: Payer: Self-pay | Admitting: Family

## 2014-01-22 ENCOUNTER — Ambulatory Visit: Payer: 59 | Admitting: Family

## 2014-02-08 ENCOUNTER — Ambulatory Visit (INDEPENDENT_AMBULATORY_CARE_PROVIDER_SITE_OTHER): Payer: 59 | Admitting: Family

## 2014-02-08 ENCOUNTER — Encounter: Payer: Self-pay | Admitting: Family

## 2014-02-08 VITALS — BP 141/87 | HR 89 | Temp 98.2°F | Resp 16 | Ht 67.5 in | Wt 271.4 lb

## 2014-02-08 DIAGNOSIS — R748 Abnormal levels of other serum enzymes: Secondary | ICD-10-CM

## 2014-02-08 DIAGNOSIS — E119 Type 2 diabetes mellitus without complications: Secondary | ICD-10-CM

## 2014-02-08 DIAGNOSIS — E785 Hyperlipidemia, unspecified: Secondary | ICD-10-CM

## 2014-02-08 DIAGNOSIS — R5383 Other fatigue: Secondary | ICD-10-CM

## 2014-02-08 DIAGNOSIS — R5381 Other malaise: Secondary | ICD-10-CM

## 2014-02-08 DIAGNOSIS — E039 Hypothyroidism, unspecified: Secondary | ICD-10-CM | POA: Insufficient documentation

## 2014-02-08 DIAGNOSIS — I10 Essential (primary) hypertension: Secondary | ICD-10-CM

## 2014-02-08 LAB — LIPID PANEL
CHOL/HDL RATIO: 6
Cholesterol: 244 mg/dL — ABNORMAL HIGH (ref 0–200)
HDL: 42.6 mg/dL (ref >39.00)
NONHDL: 201.4
TRIGLYCERIDES: 277 mg/dL — AB (ref 0.0–149.0)
VLDL: 55.4 mg/dL — AB (ref 0.0–40.0)

## 2014-02-08 LAB — BASIC METABOLIC PANEL
BUN: 13 mg/dL (ref 6–23)
CALCIUM: 9.4 mg/dL (ref 8.4–10.5)
CO2: 22 meq/L (ref 19–32)
Chloride: 106 mEq/L (ref 96–112)
Creatinine, Ser: 0.7 mg/dL (ref 0.4–1.2)
GFR: 115.23 mL/min (ref >60.00)
Glucose, Bld: 90 mg/dL (ref 70–99)
Potassium: 3.8 mEq/L (ref 3.5–5.1)
SODIUM: 136 meq/L (ref 135–145)

## 2014-02-08 LAB — HEMOGLOBIN A1C: Hgb A1c MFr Bld: 6.5 % (ref 4.6–6.5)

## 2014-02-08 LAB — TSH: TSH: 0.5 u[IU]/mL (ref 0.35–4.50)

## 2014-02-08 LAB — LDL CHOLESTEROL, DIRECT: LDL DIRECT: 154.5 mg/dL

## 2014-02-08 MED ORDER — AMLODIPINE BESYLATE 5 MG PO TABS: ORAL_TABLET | ORAL | Status: DC

## 2014-02-08 MED ORDER — LEVOTHYROXINE SODIUM 50 MCG PO TABS: ORAL_TABLET | ORAL | Status: DC

## 2014-02-08 NOTE — Assessment & Plan Note (Signed)
Clinically stable, obtain TSH, continue synthroid.

## 2014-02-08 NOTE — Progress Notes (Signed)
Subjective:    Patient ID: Kristy Chung, female    DOB: [birth year], 28 y.o.   MRN: [id number]  HPI  Kristy Chung is a 28 yr old female who presents today for follow up.    1) HTN-  Reports that her BP 120's/80's at work. She continues hctz. Denies CP/SOB/swelling. BP Readings from Last 3 Encounters:  02/08/14 141/87  09/21/13 120/74  09/20/13 116/76   2) DM2- maintained on metformin.  Reports that he last eye exam was 1.5 years ago. Fasting 83-108, post prandial 140-150, no hypoglycemia.    Lab Results  Component Value Date   HGBA1C 6.8* 09/03/2013   HGBA1C 6.3* 08/03/2012   Lab Results  Component Value Date   MICROALBUR 0.63 09/21/2013   LDLCALC 116* 09/03/2013   CREATININE 0.53 09/21/2013   3) Malaise/fatigue- pt reports that her energy level is improved.  Had flu shot at work.    Review of Systems See HPI  Past Medical History  Diagnosis Date  . History of chicken pox   . Allergy   . Hyperlipidemia   . History of migraine   . Polycystic ovarian syndrome     History   Social History  . Marital Status: Single    Spouse Name: N/A    Number of Children: N/A  . Years of Education: N/A   Occupational History  . Not on file.   Social History Main Topics  . Smoking status: Never Smoker   . Smokeless tobacco: Never Used  . Alcohol Use: Yes     Comment: occasional  . Drug Use: No  . Sexual Activity: Not Currently   Other Topics Concern  . 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 Wisdom tooth extraction  2005    Family History  Problem Relation Age of Onset  . Hyperlipidemia Mother   . Depression Mother   . Hyperlipidemia Father   . Hypertension Father   . Hypothyroidism Maternal Grandmother   . Heart disease Maternal Grandfather     MI,  stent, CAD  . Hypertension Maternal Grandfather   . Hyperlipidemia Maternal Grandfather   . COPD Paternal Grandmother   . Hypertension Paternal Grandmother   . Hyperlipidemia Paternal Grandmother   . Heart disease Paternal Grandmother   . AAA (abdominal aortic aneurysm) Paternal Grandfather   . Dementia Paternal Grandfather   . Heart disease Paternal Grandfather     Allergies  Allergen Reactions  . Other     CINNAMON GUM--blisters.    Current Outpatient Prescriptions on File Prior to Visit  Medication Sig Dispense Refill  . Blood Glucose Monitoring Suppl (TRUERESULT BLOOD GLUCOSE) W/DEVICE KIT Use as directed to check blood sugar  DX 790.29  1 each  0  . fluticasone (FLONASE) 50 MCG/ACT nasal spray Place 2 sprays into both nostrils daily.  16 g  6  . hydrochlorothiazide (HYDRODIURIL) 25 MG tablet TAKE 1 TABLET (25 MG TOTAL) BY MOUTH DAILY.  30 tablet  2  . Lancets MISC Use to check blood sugar once a day with TrueResult meter.  DX 790.29  100 each  2  . levonorgestrel-ethinyl estradiol (NORDETTE) 0.15-30 MG-MCG tablet Take 1 tablet by mouth daily.  3 Package  4  .  levothyroxine (SYNTHROID, LEVOTHROID) 50 MCG tablet TAKE 1 TABLET BY MOUTH DAILY  30 tablet  0  . metFORMIN (GLUCOPHAGE) 500 MG tablet TAKE 1 TABLET (500 MG TOTAL) BY MOUTH 2 (TWO) TIMES DAILY WITH A MEAL.  60 tablet  3  . Multiple Vitamin (MULTIVITAMIN) tablet Take 1 tablet by mouth daily.      . Omega-3 Fatty Acids (FISH OIL) 1000 MG CAPS Take 2,000 mg by mouth 2 (two) times daily.      . TRUETEST TEST test strip TRUERESULT TEST STRIPS USE AS INSTRUCTED ONCE A DAY TO CHECK BLOOD SUGAR  50 each  2  . vitamin B-12 (CYANOCOBALAMIN) 1000 MCG tablet Take 1,000 mcg by mouth daily.       No current facility-administered medications on file prior to visit.    BP 141/87  Pulse 89  Temp(Src) 98.2 F (36.8 C) (Oral)  Resp 16  Ht 5' 7.5" (1.715 m)  Wt 271 lb 6 oz (123.095 kg)  BMI 41.85 kg/m2  SpO2 99%  LMP  02/07/2014       Objective:   Physical Exam  Constitutional: She is oriented to person, place, and time. She appears well-developed and well-nourished. No distress.  HENT:  Head: Normocephalic and atraumatic.  Cardiovascular: Normal rate and regular rhythm.   No murmur heard. Pulmonary/Chest: Effort normal and breath sounds normal. No respiratory distress. She has no wheezes. She has no rales. She exhibits no tenderness.  Musculoskeletal: She exhibits no edema.  Neurological: She is alert and oriented to person, place, and time.  Psychiatric: She has a normal mood and affect. Her behavior is normal. Judgment and thought content normal.          Assessment & Plan:

## 2014-02-08 NOTE — Patient Instructions (Signed)
Please complete lab work prior to leaving. Schedule a diabetic eye exam- some local opthalmolgists include:  [facility]- [phone]  [facility]- [phone]  Add amlodipine 5mg  1/2 tab by mouth once daily.  Check bp once daily and contact me with your readings in 1 week.  Follow up in 1 month.

## 2014-02-08 NOTE — Assessment & Plan Note (Signed)
Improved. She attributes improvement to recent weight loss.

## 2014-02-08 NOTE — Assessment & Plan Note (Signed)
Stable.  Obtain A1C.  Advised pt to schedule eye exam.

## 2014-02-08 NOTE — Assessment & Plan Note (Signed)
Repeat lipid panel ?

## 2014-02-08 NOTE — Assessment & Plan Note (Addendum)
BP: 141/87 mmHg  Manual repeat bp was 122/98. Add amlodipine 2.5mg  once daily.She will check her blood pressure and contact me next week with her readings.

## 2014-02-08 NOTE — Progress Notes (Signed)
Pre visit review using our clinic review tool, if applicable. No additional management support is needed unless otherwise documented below in the visit note/SLS  

## 2014-02-10 ENCOUNTER — Telehealth: Payer: Self-pay | Admitting: Family

## 2014-02-14 ENCOUNTER — Telehealth: Payer: Self-pay | Admitting: Family

## 2014-02-14 DIAGNOSIS — E785 Hyperlipidemia, unspecified: Secondary | ICD-10-CM

## 2014-02-14 MED ORDER — COLESEVELAM HCL 625 MG PO TABS: ORAL_TABLET | ORAL | Status: DC

## 2014-02-14 NOTE — Telephone Encounter (Signed)
Cholesterol is elevated.  I would like her to continue to work on low fat/low cholesterol diet, exercise, weight loss.  I would also like her to add welchol.  Repeat flp in 3 months. Sugar is stable.

## 2014-02-15 NOTE — Telephone Encounter (Signed)
Notified pt and she voices understanding. Future lab order entered. Pt also states that her BP readings have been between 129-148/ 90-92. She has been taking HCTZ 25mg  once a day and amlopidine 2.5mg  once a day.  Pt would like you to respond to her via mychart re: BP recommendations.

## 2014-02-19 ENCOUNTER — Telehealth: Payer: Self-pay | Admitting: *Deleted

## 2014-02-19 MED ORDER — EZETIMIBE 10 MG PO TABS: 10.0000 mg | ORAL_TABLET | Freq: Every day | ORAL | Status: DC

## 2014-02-19 NOTE — Telephone Encounter (Signed)
Opened in error

## 2014-02-19 NOTE — Telephone Encounter (Signed)
Pt presented to the office. States she is "not happy" with 6 tablets of welchol daily and does not want to continue that long term. Advised pt per verbal from Provider that statins are category X for pregnancy and she does not want to put pt at that risk while she is still in child bearing years. Pt voices understanding but states she wants to try another option. Notes that the number 1 side effect of welchol is upset stomach and she states she is certain she will not be able to tolerate it. Pt has follow up on 03/14/14 and states she will try welchol until then but still wants to try another option.  Please advise.

## 2014-02-19 NOTE — Telephone Encounter (Signed)
Spoke with pharmacy, they are unable to take Welchol back since pt already picked it up. States that they were able to find Zetia coupon that allows pt to get Rx $0 copay for 1 month, will be $25 on subsequent refills. Spoke with pt and explained that when she stopped in the clinic this morning welchol was the safest option that Provider recommended at this time and Provider was made aware that pt was resistant to the quantity of welchol per day. Also reminded pt that I told her I would make Provider aware that she agreed to try welchol for this month and would readdress at her follow up but still did not want to stay on med longterm so Provider found another option. Pt states she understands but would like better communication between staff and pt in the future. States she will keep follow up at the end of this month and will complete the Welchol that she picked up.

## 2014-02-19 NOTE — Telephone Encounter (Signed)
If she does not wish to take welchol, she can try zetia instead.  I have sent rx to her pharmacy.

## 2014-02-19 NOTE — Telephone Encounter (Signed)
Please call patient, she states that she had picked up welchol rx and now says that [facility] changed her medication to zetia. Patient is very upset over this because she cannot return the medication and is out of $25

## 2014-03-14 ENCOUNTER — Ambulatory Visit (INDEPENDENT_AMBULATORY_CARE_PROVIDER_SITE_OTHER): Payer: 59 | Admitting: Family

## 2014-03-14 ENCOUNTER — Encounter: Payer: Self-pay | Admitting: Family

## 2014-03-14 VITALS — BP 124/83 | HR 93 | Temp 98.2°F | Wt 269.0 lb

## 2014-03-14 DIAGNOSIS — E785 Hyperlipidemia, unspecified: Secondary | ICD-10-CM

## 2014-03-14 DIAGNOSIS — I10 Essential (primary) hypertension: Secondary | ICD-10-CM

## 2014-03-14 MED ORDER — METOPROLOL SUCCINATE ER 25 MG PO TB24: 12.5000 mg | ORAL_TABLET | Freq: Every day | ORAL | Status: DC

## 2014-03-14 NOTE — Assessment & Plan Note (Signed)
Intolerant to amlodpiine. Will d/c and add toprol xl 12.5 mg once daily. Follow up in 1 month.

## 2014-03-14 NOTE — Patient Instructions (Signed)
Stop amlodipine, start toprol xl. Follow up in 1 month.

## 2014-03-14 NOTE — Progress Notes (Signed)
Subjective:    Patient ID: Kristy Chung, female    DOB: 09-05-1985, 28 y.o.   MRN: [id number]  HPI  Kristy Chung is a 28 yr old female who presents today for follow up.  DM2- reports stable sugars- Fasting 80-120 post prandial 149-160.    HTN-  Last visit BP was noted to be elevated.  Amlodipine was added at 2.55m once daily. Pt reports + flushing with amlodipine.  Has been taking.   Hyperlipidemia-  Not taking welchol, did start zetia and is tolerating without side effects. Has been working on improving her diet.  Lab Results  Component Value Date   CHOL 244* 02/08/2014   HDL 42.60 02/08/2014   LDLCALC 116* 09/03/2013   LDLDIRECT 154.5 02/08/2014   TRIG 277.0* 02/08/2014   CHOLHDL 6 02/08/2014     Review of Systems See HPI  Past Medical History  Diagnosis Date  . History of chicken pox   . Allergy   . Hyperlipidemia   . History of migraine   . Polycystic ovarian syndrome     History   Social History  . Marital Status: Single    Spouse Name: N/A    Number of Children: N/A  . Years of Education: N/A   Occupational History  . Not on file.   Social History Main Topics  . Smoking status: Never Smoker   . Smokeless tobacco: Never Used  . Alcohol Use: Yes     Comment: occasional  . Drug Use: No  . Sexual Activity: Not Currently   Other Topics Concern  . 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 Wisdom tooth extraction  2005    Family History  Problem Relation Age of Onset  . Hyperlipidemia Mother   . Depression Mother   . Hyperlipidemia Father   . Hypertension Father   . Hypothyroidism Maternal Grandmother   . Heart disease Maternal Grandfather     MI, stent, CAD  . Hypertension Maternal Grandfather   . Hyperlipidemia Maternal Grandfather     . COPD Paternal Grandmother   . Hypertension Paternal Grandmother   . Hyperlipidemia Paternal Grandmother   . Heart disease Paternal Grandmother   . AAA (abdominal aortic aneurysm) Paternal Grandfather   . Dementia Paternal Grandfather   . Heart disease Paternal Grandfather     Allergies  Allergen Reactions  . Amlodipine     flushing  . Other     CINNAMON GUM--blisters.    Current Outpatient Prescriptions on File Prior to Visit  Medication Sig Dispense Refill  . Blood Glucose Monitoring Suppl (TRUERESULT BLOOD GLUCOSE) W/DEVICE KIT Use as directed to check blood sugar  DX 790.29  1 each  0  . ezetimibe (ZETIA) 10 MG tablet Take 1 tablet (10 mg total) by mouth daily.  30 tablet  2  . fluticasone (FLONASE) 50 MCG/ACT nasal spray Place 2 sprays into both nostrils daily.  16 g  6  . hydrochlorothiazide (HYDRODIURIL) 25 MG tablet TAKE 1 TABLET (25 MG TOTAL) BY MOUTH DAILY.  30 tablet  2  . Lancets MISC Use to check blood sugar once a day with TrueResult meter.  DX 790.29  100 each  2  . levonorgestrel-ethinyl estradiol (NORDETTE) 0.15-30  MG-MCG tablet Take 1 tablet by mouth daily.  3 Package  4  . levothyroxine (SYNTHROID, LEVOTHROID) 50 MCG tablet TAKE 1 TABLET BY MOUTH DAILY  30 tablet  3  . metFORMIN (GLUCOPHAGE) 500 MG tablet TAKE 1 TABLET (500 MG TOTAL) BY MOUTH 2 (TWO) TIMES DAILY WITH A MEAL.  60 tablet  3  . Multiple Vitamin (MULTIVITAMIN) tablet Take 1 tablet by mouth daily.      . Omega-3 Fatty Acids (FISH OIL) 1000 MG CAPS Take 2,000 mg by mouth 2 (two) times daily.      . TRUETEST TEST test strip TRUERESULT TEST STRIPS USE AS INSTRUCTED ONCE A DAY TO CHECK BLOOD SUGAR  50 each  2  . vitamin B-12 (CYANOCOBALAMIN) 1000 MCG tablet Take 1,000 mcg by mouth daily.       No current facility-administered medications on file prior to visit.    BP 124/83  Pulse 93  Temp(Src) 98.2 F (36.8 C)  Wt 269 lb (122.018 kg)  SpO2 99%       Objective:   Physical Exam   Constitutional: She is oriented to person, place, and time. She appears well-developed and well-nourished. No distress.  HENT:  Head: Normocephalic.  Cardiovascular: Normal rate and regular rhythm.   No murmur heard. Pulmonary/Chest: Effort normal and breath sounds normal. No respiratory distress. She has no wheezes. She has no rales. She exhibits no tenderness.  Musculoskeletal: She exhibits no edema.  Neurological: She is alert and oriented to person, place, and time.  Psychiatric: She has a normal mood and affect. Her behavior is normal. Judgment and thought content normal.          Assessment & Plan:

## 2014-03-14 NOTE — Progress Notes (Signed)
Pre visit review using our clinic review tool, if applicable. No additional management support is needed unless otherwise documented below in the visit note. 

## 2014-03-14 NOTE — Assessment & Plan Note (Signed)
Tolerating zetia.continue same.

## 2014-04-15 ENCOUNTER — Encounter: Payer: Self-pay | Admitting: Family

## 2014-04-15 ENCOUNTER — Ambulatory Visit (INDEPENDENT_AMBULATORY_CARE_PROVIDER_SITE_OTHER): Payer: 59 | Admitting: Family

## 2014-04-15 VITALS — BP 120/82 | HR 80 | Temp 98.7°F | Resp 16 | Ht 67.5 in | Wt 267.4 lb

## 2014-04-15 DIAGNOSIS — I10 Essential (primary) hypertension: Secondary | ICD-10-CM

## 2014-04-15 MED ORDER — METOPROLOL SUCCINATE ER 25 MG PO TB24: 12.5000 mg | ORAL_TABLET | Freq: Every day | ORAL | Status: DC

## 2014-04-15 MED ORDER — HYDROCHLOROTHIAZIDE 25 MG PO TABS: 25.0000 mg | ORAL_TABLET | Freq: Every day | ORAL | Status: DC

## 2014-04-15 NOTE — Progress Notes (Signed)
Pre visit review using our clinic review tool, if applicable. No additional management support is needed unless otherwise documented below in the visit note. 

## 2014-04-15 NOTE — Patient Instructions (Signed)
Please schedule a follow up appointment in 3 months.

## 2014-04-15 NOTE — Assessment & Plan Note (Signed)
Stable on toprol xl.  Continue same. Follow up in 3 months.

## 2014-04-15 NOTE — Progress Notes (Signed)
Subjective:    Patient ID: Kristy Chung, female    DOB: 1986-04-04, 28 y.o.   MRN: [id number]  HPI   Kristy Chung is a 28 yr old female who presents today for follow up.  1) HTN- last visit she was  Noted to be intolerant to amlodipine.  Amlodipine was d/c'd and toprol xl was added to her regimen.  Reports rare dizziness with sudden movement. Has been checking bp at home and reports 130's over 80's.  Denies CP/SOB.   BP Readings from Last 3 Encounters:  04/15/14 120/82  03/14/14 124/83  02/08/14 141/87    Review of Systems See HPI  Past Medical History  Diagnosis Date  . History of chicken pox   . Allergy   . Hyperlipidemia   . History of migraine   . Polycystic ovarian syndrome     History   Social History  . Marital Status: Single    Spouse Name: N/A    Number of Children: N/A  . Years of Education: N/A   Occupational History  . Not on file.   Social History Main Topics  . Smoking status: Never Smoker   . Smokeless tobacco: Never Used  . Alcohol Use: Yes     Comment: occasional  . Drug Use: No  . Sexual Activity: Not Currently   Other Topics Concern  . 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 Wisdom tooth extraction  2005    Family History  Problem Relation Age of Onset  . Hyperlipidemia Mother   . Depression Mother   . Hyperlipidemia Father   . Hypertension Father   . Hypothyroidism Maternal Grandmother   . Heart disease Maternal Grandfather     MI, stent, CAD  . Hypertension Maternal Grandfather   . Hyperlipidemia Maternal Grandfather   . COPD Paternal Grandmother   . Hypertension Paternal Grandmother   . Hyperlipidemia Paternal Grandmother   . Heart disease Paternal Grandmother   . AAA (abdominal aortic aneurysm) Paternal  Grandfather   . Dementia Paternal Grandfather   . Heart disease Paternal Grandfather     Allergies  Allergen Reactions  . Amlodipine     flushing  . Other     CINNAMON GUM--blisters.    Current Outpatient Prescriptions on File Prior to Visit  Medication Sig Dispense Refill  . Blood Glucose Monitoring Suppl (TRUERESULT BLOOD GLUCOSE) W/DEVICE KIT Use as directed to check blood sugar  DX 790.29 1 each 0  . ezetimibe (ZETIA) 10 MG tablet Take 1 tablet (10 mg total) by mouth daily. 30 tablet 2  . fluticasone (FLONASE) 50 MCG/ACT nasal spray Place 2 sprays into both nostrils daily. 16 g 6  . hydrochlorothiazide (HYDRODIURIL) 25 MG tablet TAKE 1 TABLET (25 MG TOTAL) BY MOUTH DAILY. 30 tablet 2  . Lancets MISC Use to check blood sugar once a day with TrueResult meter.  DX 790.29 100 each 2  . levonorgestrel-ethinyl estradiol (NORDETTE) 0.15-30 MG-MCG tablet Take 1 tablet by mouth daily. 3 Package 4  . levothyroxine (SYNTHROID, LEVOTHROID) 50 MCG tablet TAKE 1 TABLET BY MOUTH DAILY 30 tablet 3  . metFORMIN (GLUCOPHAGE) 500 MG tablet TAKE 1 TABLET (500 MG TOTAL) BY MOUTH 2 (TWO) TIMES DAILY WITH  A MEAL. 60 tablet 3  . metoprolol succinate (TOPROL XL) 25 MG 24 hr tablet Take 0.5 tablets (12.5 mg total) by mouth daily. 30 tablet 0  . Multiple Vitamin (MULTIVITAMIN) tablet Take 1 tablet by mouth daily.    . Omega-3 Fatty Acids (FISH OIL) 1000 MG CAPS Take 2,000 mg by mouth 2 (two) times daily.    . TRUETEST TEST test strip TRUERESULT TEST STRIPS USE AS INSTRUCTED ONCE A DAY TO CHECK BLOOD SUGAR 50 each 2  . vitamin B-12 (CYANOCOBALAMIN) 1000 MCG tablet Take 1,000 mcg by mouth daily.     No current facility-administered medications on file prior to visit.    BP 120/82 mmHg  Pulse 80  Temp(Src) 98.7 F (37.1 C) (Oral)  Resp 16  Ht 5' 7.5" (1.715 m)  Wt 267 lb 6.4 oz (121.292 kg)  BMI 41.24 kg/m2  SpO2 98%  LMP 04/08/2014       Objective:   Physical Exam  Constitutional: She is  oriented to person, place, and time. She appears well-developed and well-nourished. No distress.  Cardiovascular: Normal rate and regular rhythm.   No murmur heard. Pulmonary/Chest: Effort normal and breath sounds normal. No respiratory distress. She has no wheezes. She has no rales. She exhibits no tenderness.  Neurological: She is alert and oriented to person, place, and time.  Psychiatric: She has a normal mood and affect. Her behavior is normal. Judgment and thought content normal.          Assessment & Plan:

## 2014-06-05 ENCOUNTER — Other Ambulatory Visit: Payer: Self-pay | Admitting: Family

## 2014-06-06 NOTE — Telephone Encounter (Signed)
Left detailed message informing patient of medication refill and to call our office to schedule

## 2014-06-06 NOTE — Telephone Encounter (Signed)
Sent refills of metformin and zetia to pharmacy.  Pt is due for follow up 07/16/14.  Please call pt to arrange appt.

## 2014-06-11 ENCOUNTER — Telehealth: Payer: Self-pay | Admitting: Family

## 2014-06-11 DIAGNOSIS — E785 Hyperlipidemia, unspecified: Secondary | ICD-10-CM

## 2014-06-11 MED ORDER — ATORVASTATIN CALCIUM 20 MG PO TABS: 20.0000 mg | ORAL_TABLET | Freq: Every day | ORAL | Status: DC

## 2014-06-11 NOTE — Telephone Encounter (Signed)
Notified pt and she wishes to start a statin. Lab appt has been scheduled for 09/10/14 at 8:30am. Future order entered.

## 2014-06-11 NOTE — Telephone Encounter (Signed)
Caller name: Sheniece Relation to pt: self Call back number: [phone] Pharmacy: [facility]  Reason for call:   Patient states that her copay for Zetia has gone up to $75 for a 30 day supplyand she cannot afford this and would like something else called in.

## 2014-06-11 NOTE — Telephone Encounter (Signed)
Only other option at this point is a statin.  If she wishes to use statin, I would recommend that she continue her OCP and use condoms as back up birth control to avoid risk of pregnancy as statins are not safe during pregnancy.  If she wishes to start statin, I would recommend that she repeat flp in 3 months.  Let me know and I will send rx.

## 2014-08-06 ENCOUNTER — Other Ambulatory Visit: Payer: Self-pay | Admitting: Family

## 2014-08-21 ENCOUNTER — Encounter: Payer: Self-pay | Admitting: Family

## 2014-08-21 ENCOUNTER — Ambulatory Visit (INDEPENDENT_AMBULATORY_CARE_PROVIDER_SITE_OTHER): Payer: 59 | Admitting: Family

## 2014-08-21 VITALS — BP 120/84 | HR 82 | Temp 98.1°F | Resp 18 | Ht 67.5 in | Wt 267.0 lb

## 2014-08-21 DIAGNOSIS — E785 Hyperlipidemia, unspecified: Secondary | ICD-10-CM

## 2014-08-21 DIAGNOSIS — E039 Hypothyroidism, unspecified: Secondary | ICD-10-CM

## 2014-08-21 DIAGNOSIS — E119 Type 2 diabetes mellitus without complications: Secondary | ICD-10-CM | POA: Diagnosis not present

## 2014-08-21 DIAGNOSIS — Z0000 Encounter for general adult medical examination without abnormal findings: Secondary | ICD-10-CM | POA: Diagnosis not present

## 2014-08-21 DIAGNOSIS — I10 Essential (primary) hypertension: Secondary | ICD-10-CM

## 2014-08-21 LAB — LIPID PANEL
CHOLESTEROL: 161 mg/dL (ref 0–200)
HDL: 41.9 mg/dL (ref >39.00)
LDL Cholesterol: 80 mg/dL (ref 0–99)
NonHDL: 119.1
TRIGLYCERIDES: 198 mg/dL — AB (ref 0.0–149.0)
Total CHOL/HDL Ratio: 4
VLDL: 39.6 mg/dL (ref 0.0–40.0)

## 2014-08-21 LAB — HEMOGLOBIN A1C: HEMOGLOBIN A1C: 6.1 % (ref 4.6–6.5)

## 2014-08-21 LAB — BASIC METABOLIC PANEL
BUN: 13 mg/dL (ref 6–23)
CALCIUM: 9.5 mg/dL (ref 8.4–10.5)
CO2: 21 mEq/L (ref 19–32)
Chloride: 104 mEq/L (ref 96–112)
Creatinine, Ser: 0.6 mg/dL (ref 0.40–1.20)
GFR: 125.9 mL/min (ref >60.00)
GLUCOSE: 94 mg/dL (ref 70–99)
Potassium: 3.9 mEq/L (ref 3.5–5.1)
Sodium: 134 mEq/L — ABNORMAL LOW (ref 135–145)

## 2014-08-21 LAB — TSH: TSH: 2.29 u[IU]/mL (ref 0.35–4.50)

## 2014-08-21 MED ORDER — METFORMIN HCL 500 MG PO TABS: ORAL_TABLET | ORAL | Status: DC

## 2014-08-21 MED ORDER — LEVOTHYROXINE SODIUM 50 MCG PO TABS: ORAL_TABLET | ORAL | Status: DC

## 2014-08-21 MED ORDER — ASPIRIN EC 81 MG PO TBEC: 81.0000 mg | DELAYED_RELEASE_TABLET | Freq: Every day | ORAL | Status: DC

## 2014-08-21 MED ORDER — TRUETEST TEST VI STRP: ORAL_STRIP | Status: DC

## 2014-08-21 MED ORDER — ATORVASTATIN CALCIUM 20 MG PO TABS: 20.0000 mg | ORAL_TABLET | Freq: Every day | ORAL | Status: DC

## 2014-08-21 MED ORDER — METOPROLOL SUCCINATE ER 25 MG PO TB24: ORAL_TABLET | ORAL | Status: DC

## 2014-08-21 MED ORDER — HYDROCHLOROTHIAZIDE 25 MG PO TABS: 25.0000 mg | ORAL_TABLET | Freq: Every day | ORAL | Status: DC

## 2014-08-21 NOTE — Progress Notes (Signed)
Pre visit review using our clinic review tool, if applicable. No additional management support is needed unless otherwise documented below in the visit note. 

## 2014-08-21 NOTE — Assessment & Plan Note (Signed)
Tolerating atorvastatin, obtain flp

## 2014-08-21 NOTE — Assessment & Plan Note (Signed)
Good blood sugars on metformin, continue same.

## 2014-08-21 NOTE — Assessment & Plan Note (Addendum)
Stable on synthroid. Continue same. Obtain tsh

## 2014-08-21 NOTE — Progress Notes (Signed)
Subjective:    Patient ID: Kristy Chung, female    DOB: [birth year], 29 y.o.   MRN: [id number]  HPI  Patient presents today for follow up of multiple medical problems.  Diabetes Type 2  Pt is currently maintained on the following medications for diabetes:metformin  Lab Results  Component Value Date   HGBA1C 6.5 02/08/2014   HGBA1C 6.8* 09/03/2013   HGBA1C 6.3* 08/03/2012    Lab Results  Component Value Date   MICROALBUR 0.63 09/21/2013   LDLCALC 116* 09/03/2013   CREATININE 0.7 02/08/2014    Reports improved diet and regular exercise. Last diabetic eye exam - scheduled for 4/16 Denies polyuria/polydipsia. Denies hypoglycemia Home glucose readings range fasting 80-105, 2 hr post prand 145-180  Hyperlipidemia  Patient is currently maintained on the following medication for hyperlipidemia: atorvastatin Last lipid panel as follows:  Lab Results  Component Value Date   CHOL 244* 02/08/2014   HDL 42.60 02/08/2014   LDLCALC 116* 09/03/2013   LDLDIRECT 154.5 02/08/2014   TRIG 277.0* 02/08/2014   CHOLHDL 6 02/08/2014  reports + compliance with OCP and abstinence.  Patient denies myalgia. Patient reports good compliance with low fat/low cholesterol diet.   Hypertension  Patient is currently maintained on the following medications for blood pressure: hctz, metoprolol Patient reports good compliance with blood pressure medications. Patient denies chest pain, shortness of breath or swelling. Last 3 blood pressure readings in our office are as follows: BP Readings from Last 3 Encounters:  08/21/14 120/84  04/15/14 120/82  03/14/14 124/83    Hypothyroid- reports stable on current dose of synthroid.     Review of Systems    see HPI  Past Medical History  Diagnosis Date  . History of chicken pox   . Allergy   . Hyperlipidemia   . History of migraine   . Polycystic ovarian syndrome     History   Social History  . Marital Status: Single    Spouse  Name: N/A  . Number of Children: N/A  . Years of Education: N/A   Occupational History  . Not on file.   Social History Main Topics  . Smoking status: Never Smoker   . Smokeless tobacco: Never Used  . Alcohol Use: Yes     Comment: occasional  . Drug Use: No  . Sexual Activity: Not Currently   Other Topics Concern  . 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 Wisdom tooth extraction  2005    Family History  Problem Relation Age of Onset  . Hyperlipidemia Mother   . Depression Mother   . Hyperlipidemia Father   . Hypertension Father   . Hypothyroidism Maternal Grandmother   . Heart disease Maternal Grandfather     MI, stent, CAD  . Hypertension Maternal Grandfather   . Hyperlipidemia Maternal Grandfather   . COPD Paternal Grandmother   . Hypertension Paternal Grandmother   . Hyperlipidemia Paternal Grandmother   . Heart disease Paternal Grandmother   . AAA (abdominal aortic aneurysm) Paternal Grandfather   . Dementia Paternal Grandfather   . Heart disease Paternal Grandfather     Allergies  Allergen Reactions  . Amlodipine     flushing  . Other  CINNAMON GUM--blisters.    Current Outpatient Prescriptions on File Prior to Visit  Medication Sig Dispense Refill  . Blood Glucose Monitoring Suppl (TRUERESULT BLOOD GLUCOSE) W/DEVICE KIT Use as directed to check blood sugar  DX 790.29 1 each 0  . fluticasone (FLONASE) 50 MCG/ACT nasal spray Place 2 sprays into both nostrils daily. 16 g 6  . Lancets MISC Use to check blood sugar once a day with TrueResult meter.  DX 790.29 100 each 2  . levonorgestrel-ethinyl estradiol (NORDETTE) 0.15-30 MG-MCG tablet Take 1 tablet by mouth daily. 3 Package 4  . Multiple Vitamin (MULTIVITAMIN) tablet Take 1 tablet by mouth  daily.    . Omega-3 Fatty Acids (FISH OIL) 1000 MG CAPS Take 2,000 mg by mouth 2 (two) times daily.    . vitamin B-12 (CYANOCOBALAMIN) 1000 MCG tablet Take 1,000 mcg by mouth daily.     No current facility-administered medications on file prior to visit.    BP 120/84 mmHg  Pulse 82  Temp(Src) 98.1 F (36.7 C) (Oral)  Resp 18  Ht 5' 7.5" (1.715 m)  Wt 267 lb (121.11 kg)  BMI 41.18 kg/m2  SpO2 99%  LMP 07/21/2014    Objective:   Physical Exam  Constitutional: She is oriented to person, place, and time. She appears well-developed and well-nourished.  HENT:  Head: Normocephalic and atraumatic.  Cardiovascular: Normal rate, regular rhythm and normal heart sounds.   No murmur heard. Pulmonary/Chest: Effort normal and breath sounds normal. No respiratory distress. She has no wheezes.  Musculoskeletal: She exhibits no edema.  Neurological: She is alert and oriented to person, place, and time.  Psychiatric: She has a normal mood and affect. Her behavior is normal. Judgment and thought content normal.          Assessment & Plan:

## 2014-08-21 NOTE — Patient Instructions (Signed)
Please complete lab work prior to leaving. Continue the good work with healthy diet and exercise. Please schedule a follow up appointment in 3 months.

## 2014-08-21 NOTE — Assessment & Plan Note (Signed)
BP stable on current meds.   

## 2014-08-22 ENCOUNTER — Encounter: Payer: Self-pay | Admitting: Family

## 2014-08-22 ENCOUNTER — Encounter (HOSPITAL_COMMUNITY): Payer: Self-pay | Admitting: Emergency Medicine

## 2014-08-22 ENCOUNTER — Emergency Department (HOSPITAL_COMMUNITY)
Admission: EM | Admit: 2014-08-22 | Discharge: 2014-08-22 | Disposition: A | Discharge status: Home / Self Care | Payer: 59 | Source: Home / Self Care | Attending: Family Medicine | Admitting: Family Medicine

## 2014-08-22 DIAGNOSIS — R0789 Other chest pain: Secondary | ICD-10-CM

## 2014-08-22 DIAGNOSIS — M940 Chondrocostal junction syndrome [Tietze]: Secondary | ICD-10-CM | POA: Diagnosis not present

## 2014-08-22 DIAGNOSIS — M949 Disorder of cartilage, unspecified: Secondary | ICD-10-CM

## 2014-08-22 HISTORY — DX: Type 2 diabetes mellitus without complications: E11.9

## 2014-08-22 HISTORY — DX: Essential (primary) hypertension: I10

## 2014-08-22 LAB — HIV ANTIBODY (ROUTINE TESTING W REFLEX): HIV 1&2 Ab, 4th Generation: NONREACTIVE

## 2014-08-22 NOTE — ED Provider Notes (Signed)
CSN: [id number]     Arrival date & time 08/22/14  0759 History   First MD Initiated Contact with Patient 08/22/14 0818     Chief Complaint  Patient presents with  . Abdominal Pain   (Consider location/radiation/quality/duration/timing/severity/associated sxs/prior Treatment) HPI Comments: 29 year old mildly obese female states that she was at work last night and experienced an acute popping feeling over the xiphoid process. This pain persisted and radiated bilaterally across the anterior costal margins. It hurts with taking a deep breath and other movements. She denies reflux symptoms, indigestion, heartburn. It is not affected by eating or drinking. She denies abdominal pain, nausea, vomiting, diarrhea or constipation. She denies anterior or lateral chest pain. Denies heaviness, fullness, pressure, squeezing or tightness across the chest. Denies palpitations.   Past Medical History  Diagnosis Date  . History of chicken pox   . Allergy   . Hyperlipidemia   . History of migraine   . Polycystic ovarian syndrome   . Diabetes mellitus without complication   . Hypertension   . Thyroid disease    Past Surgical History  Procedure Laterality Date  . Wisdom tooth extraction  2005   Family History  Problem Relation Age of Onset  . Hyperlipidemia Mother   . Depression Mother   . Hyperlipidemia Father   . Hypertension Father   . Hypothyroidism Maternal Grandmother   . Heart disease Maternal Grandfather     MI, stent, CAD  . Hypertension Maternal Grandfather   . Hyperlipidemia Maternal Grandfather   . COPD Paternal Grandmother   . Hypertension Paternal Grandmother   . Hyperlipidemia Paternal Grandmother   . Heart disease Paternal Grandmother   . AAA (abdominal aortic aneurysm) Paternal Grandfather   . Dementia Paternal Grandfather   . Heart disease Paternal Grandfather    History  Substance Use Topics  . Smoking status: Never Smoker   . Smokeless tobacco: Never Used  . Alcohol  Use: Yes     Comment: occasional   OB History    Gravida Para Term Preterm AB TAB SAB Ectopic Multiple Living   0              Review of Systems  Constitutional: Positive for activity change. Negative for fever, appetite change and fatigue.  HENT: Negative.   Respiratory: Negative for cough, chest tightness, shortness of breath and wheezing.   Cardiovascular: Positive for chest pain. Negative for palpitations and leg swelling.  Gastrointestinal: Negative for nausea, vomiting, abdominal pain, diarrhea and constipation.  Genitourinary: Negative.   Skin: Negative.   Neurological: Negative.   Psychiatric/Behavioral: Negative.     Allergies  Amlodipine and Other  Home Medications   Prior to Admission medications   Medication Sig Start Date End Date Taking? Authorizing Provider  aspirin EC 81 MG tablet Take 1 tablet (81 mg total) by mouth daily. 08/21/14   Debbrah Alar, NP  atorvastatin (LIPITOR) 20 MG tablet Take 1 tablet (20 mg total) by mouth daily. 08/21/14   Debbrah Alar, NP  Blood Glucose Monitoring Suppl (TRUERESULT BLOOD GLUCOSE) W/DEVICE KIT Use as directed to check blood sugar  DX 790.29 08/10/12   Debbrah Alar, NP  fluticasone (FLONASE) 50 MCG/ACT nasal spray Place 2 sprays into both nostrils daily. 09/03/13   Debbrah Alar, NP  hydrochlorothiazide (HYDRODIURIL) 25 MG tablet Take 1 tablet (25 mg total) by mouth daily. 08/21/14   Debbrah Alar, NP  Lancets MISC Use to check blood sugar once a day with TrueResult meter.  DX 790.29 08/10/12  Debbrah Alar, NP  levonorgestrel-ethinyl estradiol (NORDETTE) 0.15-30 MG-MCG tablet Take 1 tablet by mouth daily. 09/20/13   Huel Cote, NP  levothyroxine (SYNTHROID, LEVOTHROID) 50 MCG tablet TAKE 1 TABLET BY MOUTH DAILY 08/21/14   Debbrah Alar, NP  metFORMIN (GLUCOPHAGE) 500 MG tablet TAKE 1 TABLET BY MOUTH TWICE DAILY WITH A MEAL 08/21/14   Debbrah Alar, NP  metoprolol succinate (TOPROL-XL) 25 MG 24 hr  tablet TAKE 1/2 TABLET (12.5 MG TOTAL) BY MOUTH DAILY. 08/21/14   Debbrah Alar, NP  Multiple Vitamin (MULTIVITAMIN) tablet Take 1 tablet by mouth daily.    Historical Provider, MD  Omega-3 Fatty Acids (FISH OIL) 1000 MG CAPS Take 2,000 mg by mouth 2 (two) times daily.    Historical Provider, MD  TRUETEST TEST test strip TRUERESULT TEST STRIPS USE AS INSTRUCTED ONCE A DAY TO CHECK BLOOD SUGAR 08/21/14   Debbrah Alar, NP  vitamin B-12 (CYANOCOBALAMIN) 1000 MCG tablet Take 1,000 mcg by mouth daily.    Historical Provider, MD   BP 150/92 mmHg  Pulse 112  Temp(Src) 98.9 F (37.2 C) (Oral)  Resp 18  SpO2 100%  LMP 08/22/2014 Physical Exam  Constitutional: She is oriented to person, place, and time. She appears well-developed and well-nourished. No distress.  HENT:  Head: Normocephalic and atraumatic.  Mouth/Throat: Oropharynx is clear and moist. No oropharyngeal exudate.  Eyes: EOM are normal. Pupils are equal, round, and reactive to light.  Neck: Normal range of motion. Neck supple.  Cardiovascular: Normal rate and normal heart sounds.   Pulmonary/Chest: Effort normal and breath sounds normal. No respiratory distress. She has no wheezes. She has no rales. She exhibits tenderness.  Severe chest wall tenderness over the xiphoid and across the bilateral anterior costal margins. There is no abdominal tenderness. No upper chest wall tenderness. Having the patient perform movements such as sit ups or lying down elicits the pain. The pain is well localized over the xiphoid and lower costal margins bilaterally.  Abdominal: Soft. There is no tenderness. There is no rebound and no guarding.  Musculoskeletal: Normal range of motion. She exhibits no edema.  Neurological: She is alert and oriented to person, place, and time. No cranial nerve deficit.  Skin: Skin is warm and dry.  Psychiatric: She has a normal mood and affect.  Vitals reviewed.   ED Course  Procedures (including critical care  time) Labs Review Labs Reviewed - No data to display  Imaging Review No results found. ED ECG REPORT   Date: 08/22/2014  Rate: NSR  Rhythm: QRS Axis:   Intervals: PR 172  ST/T Wave abnormalities: mildly enlarged P waves. No St-T_ abnormalities  Conduction Disutrbances:No ectopy  Narrative Interpretation:   Old EKG Reviewed: None seen  I have personally reviewed the EKG tracing and agree with the computerized printout as noted. Query atrial enlargement diagnosis.   MDM   1. Costochondritis, acute   2. Xyphoidalgia    NSAIDS and ice Pt is stable Satisfied with dx Return if needed    Janne Napoleon, NP 08/22/14 [phone]

## 2014-08-22 NOTE — Discharge Instructions (Signed)
Costochondritis NSAIDS and ice Costochondritis, sometimes called Tietze syndrome, is a swelling and irritation (inflammation) of the tissue (cartilage) that connects your ribs with your breastbone (sternum). It causes pain in the chest and rib area. Costochondritis usually goes away on its own over time. It can take up to 6 weeks or longer to get better, especially if you are unable to limit your activities. CAUSES  Some cases of costochondritis have no known cause. Possible causes include:  Injury (trauma).  Exercise or activity such as lifting.  Severe coughing. SIGNS AND SYMPTOMS  Pain and tenderness in the chest and rib area.  Pain that gets worse when coughing or taking deep breaths.  Pain that gets worse with specific movements. DIAGNOSIS  Your health care provider will do a physical exam and ask about your symptoms. Chest X-rays or other tests may be done to rule out other problems. TREATMENT  Costochondritis usually goes away on its own over time. Your health care provider may prescribe medicine to help relieve pain. HOME CARE INSTRUCTIONS   Avoid exhausting physical activity. Try not to strain your ribs during normal activity. This would include any activities using chest, abdominal, and side muscles, especially if heavy weights are used.  Apply ice to the affected area for the first 2 days after the pain begins.  Put ice in a plastic bag.  Place a towel between your skin and the bag.  Leave the ice on for 20 minutes, 2-3 times a day.  Only take over-the-counter or prescription medicines as directed by your health care provider. SEEK MEDICAL CARE IF:  You have redness or swelling at the rib joints. These are signs of infection.  Your pain does not go away despite rest or medicine. SEEK IMMEDIATE MEDICAL CARE IF:   Your pain increases or you are very uncomfortable.  You have shortness of breath or difficulty breathing.  You cough up blood.  You have worse chest  pains, sweating, or vomiting.  You have a fever or persistent symptoms for more than 2-3 days.  You have a fever and your symptoms suddenly get worse. MAKE SURE YOU:   Understand these instructions.  Will watch your condition.  Will get help right away if you are not doing well or get worse. Document Released: 02/10/2005 Document Revised: 02/21/2013 Document Reviewed: 12/05/2012 [facility] Patient Information 2015 [place], [place]. This information is not intended to replace advice given to you by your health care provider. Make sure you discuss any questions you have with your health care provider.

## 2014-08-22 NOTE — ED Notes (Signed)
Notified david mabe, np

## 2014-08-22 NOTE — ED Notes (Signed)
Reports having a pulling sensation in center , epigastric area.  Then followed by intermittent stabbing pain.  Now stabbing pain is constant.  Denies nausea, vomiting, no diarrhea.  Last bm was normal, today.  Onset a few hours ago.

## 2014-09-06 ENCOUNTER — Telehealth: Payer: Self-pay | Admitting: *Deleted

## 2014-09-06 NOTE — Telephone Encounter (Signed)
Received call from pt requesting appt for Monday for abdominal pain.  Pt states she went to urgent care on 08/22/14 and diagnosed with costochondritis. Since that time she has intermittent sharp burning pain from the xyphoid area down to her belly button. States that she has a "weird pulling sensation in her abdomen if she lies on her stomach or her left side". Notes some tenderness in her LUQ.  Pt denies n/v, constipation or diarrhea; no fever. Scheduled pt appt for Monday at 10:30am. Advised pt to be evaluated in the ER if symptoms become severe and she voices understanding. Please advise if further instructions.

## 2014-09-06 NOTE — Telephone Encounter (Signed)
No further instructions. Thanks

## 2014-09-09 ENCOUNTER — Ambulatory Visit (HOSPITAL_BASED_OUTPATIENT_CLINIC_OR_DEPARTMENT_OTHER)
Admission: RE | Admit: 2014-09-09 | Discharge: 2014-09-09 | Disposition: A | Discharge status: Home / Self Care | Payer: 59 | Source: Ambulatory Visit | Attending: Family | Admitting: Family

## 2014-09-09 ENCOUNTER — Encounter: Payer: Self-pay | Admitting: Family

## 2014-09-09 ENCOUNTER — Ambulatory Visit (INDEPENDENT_AMBULATORY_CARE_PROVIDER_SITE_OTHER): Payer: 59 | Admitting: Family

## 2014-09-09 VITALS — BP 110/70 | HR 86 | Temp 98.6°F | Resp 16 | Ht 67.5 in | Wt 266.6 lb

## 2014-09-09 DIAGNOSIS — K769 Liver disease, unspecified: Secondary | ICD-10-CM | POA: Diagnosis not present

## 2014-09-09 DIAGNOSIS — R1013 Epigastric pain: Secondary | ICD-10-CM | POA: Insufficient documentation

## 2014-09-09 LAB — CBC WITH DIFFERENTIAL/PLATELET
Basophils Absolute: 0.1 10*3/uL (ref 0.0–0.1)
Basophils Relative: 0.5 % (ref 0.0–3.0)
EOS PCT: 1.6 % (ref 0.0–5.0)
Eosinophils Absolute: 0.2 10*3/uL (ref 0.0–0.7)
HEMATOCRIT: 41.4 % (ref 36.0–46.0)
Hemoglobin: 13.7 g/dL (ref 12.0–15.0)
LYMPHS ABS: 3.1 10*3/uL (ref 0.7–4.0)
Lymphocytes Relative: 29 % (ref 12.0–46.0)
MCHC: 33 g/dL (ref 30.0–36.0)
MCV: 85.6 fl (ref 78.0–100.0)
MONOS PCT: 5.4 % (ref 3.0–12.0)
Monocytes Absolute: 0.6 10*3/uL (ref 0.1–1.0)
NEUTROS PCT: 63.5 % (ref 43.0–77.0)
Neutro Abs: 6.9 10*3/uL (ref 1.4–7.7)
Platelets: 342 10*3/uL (ref 150.0–400.0)
RBC: 4.84 Mil/uL (ref 3.87–5.11)
RDW: 14.1 % (ref 11.5–15.5)
WBC: 10.8 10*3/uL — ABNORMAL HIGH (ref 4.0–10.5)

## 2014-09-09 LAB — HEPATIC FUNCTION PANEL
ALT: 15 U/L (ref 0–35)
AST: 15 U/L (ref 0–37)
Albumin: 4 g/dL (ref 3.5–5.2)
Alkaline Phosphatase: 100 U/L (ref 39–117)
BILIRUBIN DIRECT: 0.1 mg/dL (ref 0.0–0.3)
Total Bilirubin: 0.3 mg/dL (ref 0.2–1.2)
Total Protein: 6.9 g/dL (ref 6.0–8.3)

## 2014-09-09 MED ORDER — OMEPRAZOLE 20 MG PO CPDR: 20.0000 mg | DELAYED_RELEASE_CAPSULE | Freq: Every day | ORAL | Status: DC

## 2014-09-09 NOTE — Progress Notes (Signed)
Subjective:    Patient ID: Kristy Chung, female    DOB: Oct 02, 1985, 29 y.o.   MRN: [id number]  HPI  Kristy Chung is a 29 yr old female who presents today with chief complaint of abdominal pain- had "popping" noise originally that she noted overlying the sternum as well as intermittent epigastric pain. Was diagnosed with costochondritis.  Did nsaids, initial pain resolved, but then she has developed epigastric pain, early satiety. If she lays on the left side she has pulling sensation, lays on her right - no pain.  Lays on her stomach has internal "burning."  She denies vomiting.  Continues to have daily bm's which are "a little harder and a little darker."     Review of Systems   See HPI  Past Medical History  Diagnosis Date  . History of chicken pox   . Allergy   . Hyperlipidemia   . History of migraine   . Polycystic ovarian syndrome   . Diabetes mellitus without complication   . Hypertension   . Thyroid disease     History   Social History  . Marital Status: Single    Spouse Name: N/A  . Number of Children: N/A  . Years of Education: N/A   Occupational History  . Not on file.   Social History Main Topics  . Smoking status: Never Smoker   . Smokeless tobacco: Never Used  . Alcohol Use: Yes     Comment: occasional  . Drug Use: No  . Sexual Activity: Not Currently   Other Topics Concern  . 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 Wisdom tooth extraction  2005    Family History  Problem Relation Age of Onset  . Hyperlipidemia Mother   . Depression Mother   . Hyperlipidemia Father   . Hypertension Father   . Hypothyroidism Maternal Grandmother   . Heart disease Maternal Grandfather     MI, stent, CAD  . Hypertension Maternal Grandfather   .  Hyperlipidemia Maternal Grandfather   . COPD Paternal Grandmother   . Hypertension Paternal Grandmother   . Hyperlipidemia Paternal Grandmother   . Heart disease Paternal Grandmother   . AAA (abdominal aortic aneurysm) Paternal Grandfather   . Dementia Paternal Grandfather   . Heart disease Paternal Grandfather     Allergies  Allergen Reactions  . Amlodipine     flushing  . Other     CINNAMON GUM--blisters.    Current Outpatient Prescriptions on File Prior to Visit  Medication Sig Dispense Refill  . aspirin EC 81 MG tablet Take 1 tablet (81 mg total) by mouth daily.    Marland Kitchen atorvastatin (LIPITOR) 20 MG tablet Take 1 tablet (20 mg total) by mouth daily. 90 tablet 1  . Blood Glucose Monitoring Suppl (TRUERESULT BLOOD GLUCOSE) W/DEVICE KIT Use as directed to check blood sugar  DX 790.29 1 each 0  . fluticasone (FLONASE) 50 MCG/ACT nasal spray Place 2 sprays into both nostrils daily. 16 g 6  . hydrochlorothiazide (HYDRODIURIL) 25 MG tablet Take 1 tablet (25 mg total) by mouth daily. 90 tablet 1  . Lancets MISC Use to check blood sugar once a day with TrueResult meter.  DX 790.29 100 each 2  .  levonorgestrel-ethinyl estradiol (NORDETTE) 0.15-30 MG-MCG tablet Take 1 tablet by mouth daily. 3 Package 4  . levothyroxine (SYNTHROID, LEVOTHROID) 50 MCG tablet TAKE 1 TABLET BY MOUTH DAILY 90 tablet 1  . metFORMIN (GLUCOPHAGE) 500 MG tablet TAKE 1 TABLET BY MOUTH TWICE DAILY WITH A MEAL 180 tablet 1  . metoprolol succinate (TOPROL-XL) 25 MG 24 hr tablet TAKE 1/2 TABLET (12.5 MG TOTAL) BY MOUTH DAILY. 45 tablet 1  . Multiple Vitamin (MULTIVITAMIN) tablet Take 1 tablet by mouth daily.    . Omega-3 Fatty Acids (FISH OIL) 1000 MG CAPS Take 2,000 mg by mouth 2 (two) times daily.    . TRUETEST TEST test strip TRUERESULT TEST STRIPS USE AS INSTRUCTED ONCE A DAY TO CHECK BLOOD SUGAR 100 each 1  . vitamin B-12 (CYANOCOBALAMIN) 1000 MCG tablet Take 1,000 mcg by mouth daily.     No current  facility-administered medications on file prior to visit.    BP 110/70 mmHg  Pulse 86  Temp(Src) 98.6 F (37 C) (Oral)  Resp 16  Ht 5' 7.5" (1.715 m)  Wt 266 lb 9.6 oz (120.929 kg)  BMI 41.12 kg/m2  SpO2 99%  LMP 08/22/2014       Objective:   Physical Exam  Constitutional: She is oriented to person, place, and time. She appears well-developed and well-nourished.  HENT:  Head: Normocephalic and atraumatic.  Cardiovascular: Normal rate, regular rhythm and normal heart sounds.   No murmur heard. Pulmonary/Chest: Effort normal and breath sounds normal. No respiratory distress. She has no wheezes.  Abdominal: Soft. Bowel sounds are normal. She exhibits no distension. There is no tenderness. There is no rebound and no guarding.  Musculoskeletal: She exhibits no edema.  Neurological: She is alert and oriented to person, place, and time.  Psychiatric: She has a normal mood and affect. Her behavior is normal. Judgment and thought content normal.          Assessment & Plan:  s

## 2014-09-09 NOTE — Patient Instructions (Signed)
Please complete lab work prior to leaving. Complete ultrasound on the first floor. Start omeprazole 22m once daily for acid.  Complete stool kit and mail back at your earliest convenience. Follow up in 1 month, call sooner if symptoms worsen or do not improve.

## 2014-09-09 NOTE — Progress Notes (Signed)
Pre visit review using our clinic review tool, if applicable. No additional management support is needed unless otherwise documented below in the visit note. 

## 2014-09-09 NOTE — Assessment & Plan Note (Addendum)
New. Possible etiologies include: hiatal hernia, gerd, cholecystitis PUD.  Trial of PPI, obtain lft, H pylori testing, abdominal ultrasound. If work up unremarkable, consider GI referral.

## 2014-09-10 ENCOUNTER — Telehealth: Payer: Self-pay | Admitting: Family

## 2014-09-10 ENCOUNTER — Other Ambulatory Visit: Payer: Self-pay | Admitting: Family

## 2014-09-10 ENCOUNTER — Encounter: Payer: Self-pay | Admitting: Family

## 2014-09-10 ENCOUNTER — Other Ambulatory Visit: Payer: 59

## 2014-09-10 DIAGNOSIS — R160 Hepatomegaly, not elsewhere classified: Secondary | ICD-10-CM

## 2014-09-10 LAB — H. PYLORI BREATH TEST: H. PYLORI BREATH TEST: NOT DETECTED

## 2014-09-10 LAB — HM DIABETES EYE EXAM

## 2014-09-10 NOTE — Telephone Encounter (Signed)
Reviewed abdominal US.  Multiple solid appearing liver lesions noted- most likely benign hemangiomas, but radiologist is recommending MRI for further evaluation. Gall bladder looks normal. WBC very mildly elevated, lft's normal.  H pylori test is pending.  She should have urine hcg prior to mri.

## 2014-09-10 NOTE — Telephone Encounter (Signed)
Notified pt and she voices understanding. Pt agreeable to proceed with MRI and has been advised to contact me once MRI is scheduled to set up time to complete urine HCG.

## 2014-09-11 ENCOUNTER — Encounter: Payer: Self-pay | Admitting: Family

## 2014-09-11 ENCOUNTER — Telehealth: Payer: Self-pay | Admitting: Family

## 2014-09-11 DIAGNOSIS — R160 Hepatomegaly, not elsewhere classified: Secondary | ICD-10-CM

## 2014-09-11 MED ORDER — ALPRAZOLAM 1 MG PO TABS: ORAL_TABLET | ORAL | Status: DC

## 2014-09-11 NOTE — Telephone Encounter (Signed)
Notified pt. She will complete urine HCG on Friday (4/29). Order entered. Rx called to [facility] at [employer].

## 2014-09-11 NOTE — Telephone Encounter (Signed)
Caller name: Coreas,Mckinzey Relation to pt: self Call back number: [phone]   Reason for call:  Pt has a scheduled MRI test for Monday and requesting a RX for nervousness and requesting a order for a urine pregnancy. Please advise

## 2014-09-11 NOTE — Telephone Encounter (Signed)
Please call in xanax as below.  Advise pt to arrange transportation home from MRI. Can you please arrange urine pregnancy test? thanks

## 2014-09-12 ENCOUNTER — Other Ambulatory Visit (INDEPENDENT_AMBULATORY_CARE_PROVIDER_SITE_OTHER): Payer: 59

## 2014-09-12 DIAGNOSIS — R1013 Epigastric pain: Secondary | ICD-10-CM | POA: Diagnosis not present

## 2014-09-12 LAB — FECAL OCCULT BLOOD, IMMUNOCHEMICAL: Fecal Occult Bld: NEGATIVE

## 2014-09-13 ENCOUNTER — Other Ambulatory Visit (INDEPENDENT_AMBULATORY_CARE_PROVIDER_SITE_OTHER): Payer: 59

## 2014-09-13 DIAGNOSIS — R160 Hepatomegaly, not elsewhere classified: Secondary | ICD-10-CM

## 2014-09-13 LAB — POCT URINE PREGNANCY: Preg Test, Ur: NEGATIVE

## 2014-09-16 ENCOUNTER — Ambulatory Visit (INDEPENDENT_AMBULATORY_CARE_PROVIDER_SITE_OTHER): Payer: 59

## 2014-09-16 ENCOUNTER — Telehealth: Payer: Self-pay | Admitting: *Deleted

## 2014-09-16 ENCOUNTER — Encounter: Payer: Self-pay | Admitting: Family

## 2014-09-16 DIAGNOSIS — R160 Hepatomegaly, not elsewhere classified: Secondary | ICD-10-CM | POA: Diagnosis not present

## 2014-09-16 DIAGNOSIS — D134 Benign neoplasm of liver: Secondary | ICD-10-CM

## 2014-09-16 MED ORDER — GADOBENATE DIMEGLUMINE 529 MG/ML IV SOLN
20.0000 mL | Freq: Once | INTRAVENOUS | Status: AC | PRN
  Administered 2014-09-16: 20 mL via INTRAVENOUS

## 2014-09-16 NOTE — Telephone Encounter (Signed)
Health maintenance has been updated.

## 2014-09-16 NOTE — Telephone Encounter (Signed)
-----   Message from Debbrah Alar, NP sent at 09/16/2014  7:10 AM EDT ----- Normal DM eye exam 09/10/14

## 2014-09-18 ENCOUNTER — Telehealth: Payer: Self-pay | Admitting: *Deleted

## 2014-09-18 ENCOUNTER — Encounter: Payer: Self-pay | Admitting: Family

## 2014-09-18 NOTE — Telephone Encounter (Signed)
Pt had MRI of liver done yesterday order by PCP states it showed live hepatic adenoma. PCP noted patient may want to stop taking birth control pills. Pt has PCOS as well, her annual is scheduled on 10/08/14. Pt asked if she should stop pills now? Please advise

## 2014-09-18 NOTE — Telephone Encounter (Signed)
Left message to finish current pack of pills and will discuss at annual exam.

## 2014-09-20 ENCOUNTER — Encounter: Payer: Self-pay | Admitting: Physician Assistant

## 2014-09-20 ENCOUNTER — Encounter: Payer: Self-pay | Admitting: Family

## 2014-09-20 ENCOUNTER — Ambulatory Visit (INDEPENDENT_AMBULATORY_CARE_PROVIDER_SITE_OTHER): Payer: 59 | Admitting: Family

## 2014-09-20 VITALS — BP 116/82 | HR 62 | Temp 98.2°F | Resp 16 | Ht 67.5 in | Wt 268.2 lb

## 2014-09-20 DIAGNOSIS — R160 Hepatomegaly, not elsewhere classified: Secondary | ICD-10-CM

## 2014-09-20 DIAGNOSIS — D134 Benign neoplasm of liver: Secondary | ICD-10-CM | POA: Diagnosis not present

## 2014-09-20 NOTE — Patient Instructions (Signed)
Please complete lab work prior to leaving. You will be contacted about your referral to GI.  Follow up in 3 months.

## 2014-09-20 NOTE — Assessment & Plan Note (Addendum)
New.  Most likely benign.  Reviewed results with pt. She is concerned re: risk of malignancy and is requesting AFP which I think is reasonable. She is also wondering if perhaps any of these lesions warrant intervention such as ablation.  She is concerned re: possibility of hemorrhage.  Advised pt to d/c aspirin. Will obtain AFP and refer to GI for further evaluation and recommendations. She will need a follow up MRI after 12/17/14. A copy of her MRI was faxed to her GYN who has advised her to stop OCP. The pt has follow up with her next month.

## 2014-09-20 NOTE — Progress Notes (Signed)
Subjective:    Patient ID: Kristy Chung, female    DOB: 11/19/1985, 29 y.o.   MRN: [id number]  HPI  Kristy Chung is a 29 yr old female who presents today to discuss MRI results.  Had MRI to evaluate masses noted on abd [place].  Note was made of various liver masses which were thought to likely represent hepatic adenomas.  She continues prilosec for possible gerd symptoms. She continues to have early satiety.     Review of Systems See HPI  Past Medical History  Diagnosis Date  . History of chicken pox   . Allergy   . Hyperlipidemia   . History of migraine   . Polycystic ovarian syndrome   . Diabetes mellitus without complication   . Hypertension   . Thyroid disease     History   Social History  . Marital Status: Single    Spouse Name: N/A  . Number of Children: N/A  . Years of Education: N/A   Occupational History  . Not on file.   Social History Main Topics  . Smoking status: Never Smoker   . Smokeless tobacco: Never Used  . Alcohol Use: Yes     Comment: occasional  . Drug Use: No  . Sexual Activity: Not Currently   Other Topics Concern  . 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 Wisdom tooth extraction  2005    Family History  Problem Relation Age of Onset  . Hyperlipidemia Mother   . Depression Mother   . Hyperlipidemia Father   . Hypertension Father   . Hypothyroidism Maternal Grandmother   . Heart disease Maternal Grandfather     MI, stent, CAD  . Hypertension Maternal Grandfather   . Hyperlipidemia Maternal Grandfather   . COPD Paternal Grandmother   . Hypertension Paternal Grandmother   . Hyperlipidemia Paternal Grandmother   . Heart disease Paternal Grandmother   . AAA (abdominal aortic aneurysm) Paternal Grandfather   . Dementia  Paternal Grandfather   . Heart disease Paternal Grandfather     Allergies  Allergen Reactions  . Amlodipine     flushing  . Other     CINNAMON GUM--blisters.    Current Outpatient Prescriptions on File Prior to Visit  Medication Sig Dispense Refill  . ALPRAZolam (XANAX) 1 MG tablet One tab prior to mri. 1 tablet 0  . atorvastatin (LIPITOR) 20 MG tablet Take 1 tablet (20 mg total) by mouth daily. 90 tablet 1  . Blood Glucose Monitoring Suppl (TRUERESULT BLOOD GLUCOSE) W/DEVICE KIT Use as directed to check blood sugar  DX 790.29 1 each 0  . fluticasone (FLONASE) 50 MCG/ACT nasal spray Place 2 sprays into both nostrils daily. 16 g 6  . hydrochlorothiazide (HYDRODIURIL) 25 MG tablet Take 1 tablet (25 mg total) by mouth daily. 90 tablet 1  . Lancets MISC Use to check blood sugar once a day with TrueResult meter.  DX 790.29 100 each 2  . levothyroxine (SYNTHROID, LEVOTHROID) 50 MCG tablet TAKE 1 TABLET BY MOUTH DAILY 90 tablet 1  . metFORMIN (GLUCOPHAGE) 500 MG tablet TAKE 1 TABLET BY MOUTH TWICE DAILY WITH A MEAL 180 tablet 1  . metoprolol succinate (TOPROL-XL) 25 MG 24 hr tablet  TAKE 1/2 TABLET (12.5 MG TOTAL) BY MOUTH DAILY. 45 tablet 1  . Multiple Vitamin (MULTIVITAMIN) tablet Take 1 tablet by mouth daily.    . Omega-3 Fatty Acids (FISH OIL) 1000 MG CAPS Take 2,000 mg by mouth 2 (two) times daily.    Marland Kitchen omeprazole (PRILOSEC) 20 MG capsule Take 1 capsule (20 mg total) by mouth daily. 30 capsule 2  . TRUETEST TEST test strip TRUERESULT TEST STRIPS USE AS INSTRUCTED ONCE A DAY TO CHECK BLOOD SUGAR 100 each 1  . vitamin B-12 (CYANOCOBALAMIN) 1000 MCG tablet Take 1,000 mcg by mouth daily.     No current facility-administered medications on file prior to visit.    BP 116/82 mmHg  Pulse 62  Temp(Src) 98.2 F (36.8 C) (Oral)  Resp 16  Ht 5' 7.5" (1.715 m)  Wt 268 lb 3.2 oz (121.655 kg)  BMI 41.36 kg/m2  SpO2 98%  LMP 09/20/2014       Objective:   Physical Exam  Constitutional:  She is oriented to person, place, and time. She appears well-developed and well-nourished.  HENT:  Head: Normocephalic and atraumatic.  Cardiovascular: Normal rate, regular rhythm and normal heart sounds.   No murmur heard. Pulmonary/Chest: Effort normal and breath sounds normal. No respiratory distress. She has no wheezes.  Abdominal: Soft. Bowel sounds are normal. She exhibits no distension. There is no tenderness. There is no rebound.  Neurological: She is alert and oriented to person, place, and time.  Skin: Skin is warm and dry.  Psychiatric: She has a normal mood and affect. Her behavior is normal. Judgment and thought content normal.          Assessment & Plan:

## 2014-09-20 NOTE — Progress Notes (Signed)
Pre visit review using our clinic review tool, if applicable. No additional management support is needed unless otherwise documented below in the visit note. 

## 2014-09-21 LAB — AFP TUMOR MARKER: AFP TUMOR MARKER: 2.4 ng/mL (ref <6.1)

## 2014-09-22 ENCOUNTER — Encounter: Payer: Self-pay | Admitting: Family

## 2014-09-24 ENCOUNTER — Encounter: Payer: Self-pay | Admitting: Family

## 2014-09-28 ENCOUNTER — Other Ambulatory Visit (HOSPITAL_BASED_OUTPATIENT_CLINIC_OR_DEPARTMENT_OTHER): Payer: 59

## 2014-10-08 ENCOUNTER — Ambulatory Visit (INDEPENDENT_AMBULATORY_CARE_PROVIDER_SITE_OTHER): Payer: 59 | Admitting: Women's Health

## 2014-10-08 ENCOUNTER — Encounter: Payer: Self-pay | Admitting: Women's Health

## 2014-10-08 VITALS — BP 130/80 | Ht 67.0 in | Wt 269.0 lb

## 2014-10-08 DIAGNOSIS — Z01419 Encounter for gynecological examination (general) (routine) without abnormal findings: Secondary | ICD-10-CM

## 2014-10-08 DIAGNOSIS — N912 Amenorrhea, unspecified: Secondary | ICD-10-CM

## 2014-10-08 MED ORDER — MEDROXYPROGESTERONE ACETATE 10 MG PO TABS: ORAL_TABLET | ORAL | Status: DC

## 2014-10-08 NOTE — Patient Instructions (Signed)
Health Maintenance Adopting a healthy lifestyle and getting preventive care can go a long way to promote health and wellness. Talk with your health care provider about what schedule of regular examinations is right for you. This is a good chance for you to check in with your provider about disease prevention and staying healthy. In between checkups, there are plenty of things you can do on your own. Experts have done a lot of research about which lifestyle changes and preventive measures are most likely to keep you healthy. Ask your health care provider for more information. WEIGHT AND DIET  Eat a healthy diet  Be sure to include plenty of vegetables, fruits, low-fat dairy products, and lean protein.  Do not eat a lot of foods high in solid fats, added sugars, or salt.  Get regular exercise. This is one of the most important things you can do for your health.  Most adults should exercise for at least 150 minutes each week. The exercise should increase your heart rate and make you sweat (moderate-intensity exercise).  Most adults should also do strengthening exercises at least twice a week. This is in addition to the moderate-intensity exercise.  Maintain a healthy weight  Body mass index (BMI) is a measurement that can be used to identify possible weight problems. It estimates body fat based on height and weight. Your health care provider can help determine your BMI and help you achieve or maintain a healthy weight.  For females 25 years of age and older:   A BMI below 18.5 is considered underweight.  A BMI of 18.5 to 24.9 is normal.  A BMI of 25 to 29.9 is considered overweight.  A BMI of 30 and above is considered obese.  Watch levels of cholesterol and blood lipids  You should start having your blood tested for lipids and cholesterol at 29 years of age, then have this test every 5 years.  You may need to have your cholesterol levels checked more often if:  Your lipid or  cholesterol levels are high.  You are older than 29 years of age.  You are at high risk for heart disease.  CANCER SCREENING   Lung Cancer  Lung cancer screening is recommended for adults 97-92 years old who are at high risk for lung cancer because of a history of smoking.  A yearly low-dose CT scan of the lungs is recommended for people who:  Currently smoke.  Have quit within the past 15 years.  Have at least a 30-pack-year history of smoking. A pack year is smoking an average of one pack of cigarettes a day for 1 year.  Yearly screening should continue until it has been 15 years since you quit.  Yearly screening should stop if you develop a health problem that would prevent you from having lung cancer treatment.  Breast Cancer  Practice breast self-awareness. This means understanding how your breasts normally appear and feel.  It also means doing regular breast self-exams. Let your health care provider know about any changes, no matter how small.  If you are in your 20s or 30s, you should have a clinical breast exam (CBE) by a health care provider every 1-3 years as part of a regular health exam.  If you are 76 or older, have a CBE every year. Also consider having a breast X-ray (mammogram) every year.  If you have a family history of breast cancer, talk to your health care provider about genetic screening.  If you are  at high risk for breast cancer, talk to your health care provider about having an MRI and a mammogram every year.  Breast cancer gene (BRCA) assessment is recommended for women who have family members with BRCA-related cancers. BRCA-related cancers include:  Breast.  Ovarian.  Tubal.  Peritoneal cancers.  Results of the assessment will determine the need for genetic counseling and BRCA1 and BRCA2 testing. Cervical Cancer Routine pelvic examinations to screen for cervical cancer are no longer recommended for nonpregnant women who are considered low  risk for cancer of the pelvic organs (ovaries, uterus, and vagina) and who do not have symptoms. A pelvic examination may be necessary if you have symptoms including those associated with pelvic infections. Ask your health care provider if a screening pelvic exam is right for you.   The Pap test is the screening test for cervical cancer for women who are considered at risk.  If you had a hysterectomy for a problem that was not cancer or a condition that could lead to cancer, then you no longer need Pap tests.  If you are older than 65 years, and you have had normal Pap tests for the past 10 years, you no longer need to have Pap tests.  If you have had past treatment for cervical cancer or a condition that could lead to cancer, you need Pap tests and screening for cancer for at least 20 years after your treatment.  If you no longer get a Pap test, assess your risk factors if they change (such as having a new sexual partner). This can affect whether you should start being screened again.  Some women have medical problems that increase their chance of getting cervical cancer. If this is the case for you, your health care provider may recommend more frequent screening and Pap tests.  The human papillomavirus (HPV) test is another test that may be used for cervical cancer screening. The HPV test looks for the virus that can cause cell changes in the cervix. The cells collected during the Pap test can be tested for HPV.  The HPV test can be used to screen women 30 years of age and older. Getting tested for HPV can extend the interval between normal Pap tests from three to five years.  An HPV test also should be used to screen women of any age who have unclear Pap test results.  After 30 years of age, women should have HPV testing as often as Pap tests.  Colorectal Cancer  This type of cancer can be detected and often prevented.  Routine colorectal cancer screening usually begins at 29 years of  age and continues through 29 years of age.  Your health care provider may recommend screening at an earlier age if you have risk factors for colon cancer.  Your health care provider may also recommend using home test kits to check for hidden blood in the stool.  A small camera at the end of a tube can be used to examine your colon directly (sigmoidoscopy or colonoscopy). This is done to check for the earliest forms of colorectal cancer.  Routine screening usually begins at age 50.  Direct examination of the colon should be repeated every 5-10 years through 29 years of age. However, you may need to be screened more often if early forms of precancerous polyps or small growths are found. Skin Cancer  Check your skin from head to toe regularly.  Tell your health care provider about any new moles or changes in   moles, especially if there is a change in a mole's shape or color.  Also tell your health care provider if you have a mole that is larger than the size of a pencil eraser.  Always use sunscreen. Apply sunscreen liberally and repeatedly throughout the day.  Protect yourself by wearing long sleeves, pants, a wide-brimmed hat, and sunglasses whenever you are outside. HEART DISEASE, DIABETES, AND HIGH BLOOD PRESSURE   Have your blood pressure checked at least every 1-2 years. High blood pressure causes heart disease and increases the risk of stroke.  If you are between 75 years and 42 years old, ask your health care provider if you should take aspirin to prevent strokes.  Have regular diabetes screenings. This involves taking a blood sample to check your fasting blood sugar level.  If you are at a normal weight and have a low risk for diabetes, have this test once every three years after 29 years of age.  If you are overweight and have a high risk for diabetes, consider being tested at a younger age or more often. PREVENTING INFECTION  Hepatitis B  If you have a higher risk for  hepatitis B, you should be screened for this virus. You are considered at high risk for hepatitis B if:  You were born in a country where hepatitis B is common. Ask your health care provider which countries are considered high risk.  Your parents were born in a high-risk country, and you have not been immunized against hepatitis B (hepatitis B vaccine).  You have HIV or AIDS.  You use needles to inject street drugs.  You live with someone who has hepatitis B.  You have had sex with someone who has hepatitis B.  You get hemodialysis treatment.  You take certain medicines for conditions, including cancer, organ transplantation, and autoimmune conditions. Hepatitis C  Blood testing is recommended for:  Everyone born from [birth year] through 1965.  Anyone with known risk factors for hepatitis C. Sexually transmitted infections (STIs)  You should be screened for sexually transmitted infections (STIs) including gonorrhea and chlamydia if:  You are sexually active and are younger than 29 years of age.  You are older than 29 years of age and your health care provider tells you that you are at risk for this type of infection.  Your sexual activity has changed since you were last screened and you are at an increased risk for chlamydia or gonorrhea. Ask your health care provider if you are at risk.  If you do not have HIV, but are at risk, it may be recommended that you take a prescription medicine daily to prevent HIV infection. This is called pre-exposure prophylaxis (PrEP). You are considered at risk if:  You are sexually active and do not regularly use condoms or know the HIV status of your partner(s).  You take drugs by injection.  You are sexually active with a partner who has HIV. Talk with your health care provider about whether you are at high risk of being infected with HIV. If you choose to begin PrEP, you should first be tested for HIV. You should then be tested every 3 months for  as long as you are taking PrEP.  PREGNANCY   If you are premenopausal and you may become pregnant, ask your health care provider about preconception counseling.  If you may become pregnant, take 400 to 800 micrograms (mcg) of folic acid every day.  If you want to prevent pregnancy, talk to your  health care provider about birth control (contraception). OSTEOPOROSIS AND MENOPAUSE   Osteoporosis is a disease in which the bones lose minerals and strength with aging. This can result in serious bone fractures. Your risk for osteoporosis can be identified using a bone density scan.  If you are 65 years of age or older, or if you are at risk for osteoporosis and fractures, ask your health care provider if you should be screened.  Ask your health care provider whether you should take a calcium or vitamin D supplement to lower your risk for osteoporosis.  Menopause may have certain physical symptoms and risks.  Hormone replacement therapy may reduce some of these symptoms and risks. Talk to your health care provider about whether hormone replacement therapy is right for you.  HOME CARE INSTRUCTIONS   Schedule regular health, dental, and eye exams.  Stay current with your immunizations.   Do not use any tobacco products including cigarettes, chewing tobacco, or electronic cigarettes.  If you are pregnant, do not drink alcohol.  If you are breastfeeding, limit how much and how often you drink alcohol.  Limit alcohol intake to no more than 1 drink per day for nonpregnant women. One drink equals 12 ounces of beer, 5 ounces of wine, or 1 ounces of hard liquor.  Do not use street drugs.  Do not share needles.  Ask your health care provider for help if you need support or information about quitting drugs.  Tell your health care provider if you often feel depressed.  Tell your health care provider if you have ever been abused or do not feel safe at home. Document Released: 11/16/2010  Document Revised: 09/17/2013 Document Reviewed: 04/04/2013 Exit[employer] Patient Information 2015 [employer]. This information is not intended to replace advice given to you by your health care provider. Make sure you discuss any questions you have with your health care provider. Exercise to Stay Healthy Exercise helps you become and stay healthy. EXERCISE IDEAS AND TIPS Choose exercises that:  You enjoy.  Fit into your day. You do not need to exercise really hard to be healthy. You can do exercises at a slow or medium level and stay healthy. You can:  Stretch before and after working out.  Try yoga, Pilates, or tai chi.  Lift weights.  Walk fast, swim, jog, run, climb stairs, bicycle, dance, or rollerskate.  Take aerobic classes. Exercises that burn about 150 calories:  Running 1  miles in 15 minutes.  Playing volleyball for 45 to 60 minutes.  Washing and waxing a car for 45 to 60 minutes.  Playing touch football for 45 minutes.  Walking 1  miles in 35 minutes.  Pushing a stroller 1  miles in 30 minutes.  Playing basketball for 30 minutes.  Raking leaves for 30 minutes.  Bicycling 5 miles in 30 minutes.  Walking 2 miles in 30 minutes.  Dancing for 30 minutes.  Shoveling snow for 15 minutes.  Swimming laps for 20 minutes.  Walking up stairs for 15 minutes.  Bicycling 4 miles in 15 minutes.  Gardening for 30 to 45 minutes.  Jumping rope for 15 minutes.  Washing windows or floors for 45 to 60 minutes. Document Released: 06/05/2010 Document Revised: 07/26/2011 Document Reviewed: 06/05/2010 Exit[employer] Patient Information 2015 [employer]. This information is not intended to replace advice given to you by your health care provider. Make sure you discuss any questions you have with your health care provider.  

## 2014-10-08 NOTE — Progress Notes (Signed)
Kristy Chung 06-06-1985 [id number]    History:    Presents for annual exam.  Regular monthly cycle on Levora. Has recently stopped due to MRI 09-30-14 showing 6 liver adenomas, has scheduled follow-up with GI and a repeat MRI in 3-6 months. Liver lesions were found incidentally with testing for epigastric pain that has since resolved. Has stopped OCs due to increased incidence of liver adenomas with OC use. (30-40 per 1 mil) Has not been sexually active for about 3 years, history of PCOS and irregular cycles. Hypertension, hypercholesterolemia and hypothyroidism managed by primary care. Normal Pap history, gardasil series completed.  Past medical history, past surgical history, family history and social history were all reviewed and documented in the EPIC chart. 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 Father hypertension, parents hypercholesterolemia.  ROS:  A ROS was performed and pertinent positives and negatives are included.  Exam:  Filed Vitals:   10/08/14 1132  BP: 130/80    General appearance:  Normal Thyroid:  Symmetrical, normal in size, without palpable masses or nodularity. Respiratory  Auscultation:  Clear without wheezing or rhonchi Cardiovascular  Auscultation:  Regular rate, without rubs, murmurs or gallops  Edema/varicosities:  Not grossly evident Abdominal  Soft,nontender, without masses, guarding or rebound.  Liver/spleen:  No organomegaly noted  Hernia:  None appreciated  Skin  Inspection:  Grossly normal   Breasts: Examined lying and sitting.     Right: Without masses, retractions, discharge or axillary adenopathy.     Left: Without masses, retractions, discharge or axillary adenopathy. Gentitourinary   Inguinal/mons:  Normal without inguinal adenopathy  External genitalia:  Normal  BUS/Urethra/Skene's glands:  Normal  Vagina:  Normal  Cervix:  Normal  Uterus:   normal in size, shape and contour.  Midline and mobile  Adnexa/parametria:     Rt: Without masses or  tenderness.   Lt: Without masses or tenderness.  Anus and perineum: Normal  Digital rectal exam: Normal sphincter tone without palpated masses or tenderness  Assessment/Plan:  29 y.o. SWF G0 for annual exam.    PCO S/history of irregular cycles and amenorrhea Not sexually active Liver adenomas-follow-up scheduled with [facility] Obesity Hypertension/hyperthyroidism/hypercholesterolemia/type 2 diabetes-primary care manages labs and meds  Plan: Options reviewed, Provera 10 mg by mouth daily 5 days each month if no cycle. Discussed importance of returning to office for contraception management if becomes sexually active, IUDs reviewed. SBE's, increase exercise and decrease calories for weight loss encouraged. MVI daily encouraged. UA, Pap normal 2015, new screening guidelines reviewed.    Huel Cote [facility], 12:13 PM 10/08/2014

## 2014-10-09 ENCOUNTER — Encounter: Payer: Self-pay | Admitting: Physician Assistant

## 2014-10-09 ENCOUNTER — Telehealth: Payer: Self-pay

## 2014-10-09 ENCOUNTER — Ambulatory Visit (INDEPENDENT_AMBULATORY_CARE_PROVIDER_SITE_OTHER): Payer: 59 | Admitting: Physician Assistant

## 2014-10-09 ENCOUNTER — Ambulatory Visit: Payer: 59 | Admitting: Family

## 2014-10-09 VITALS — BP 130/78 | HR 90 | Ht 67.0 in | Wt 266.8 lb

## 2014-10-09 DIAGNOSIS — R1011 Right upper quadrant pain: Secondary | ICD-10-CM | POA: Diagnosis not present

## 2014-10-09 DIAGNOSIS — D134 Benign neoplasm of liver: Secondary | ICD-10-CM | POA: Diagnosis not present

## 2014-10-09 LAB — URINALYSIS W MICROSCOPIC + REFLEX CULTURE
BILIRUBIN URINE: NEGATIVE
CRYSTALS: NONE SEEN
Casts: NONE SEEN
Glucose, UA: NEGATIVE mg/dL
Hgb urine dipstick: NEGATIVE
Ketones, ur: NEGATIVE mg/dL
LEUKOCYTES UA: NEGATIVE
Nitrite: NEGATIVE
Protein, ur: NEGATIVE mg/dL
Specific Gravity, Urine: 1.018 (ref 1.005–1.030)
Urobilinogen, UA: 0.2 mg/dL (ref 0.0–1.0)
pH: 5.5 (ref 5.0–8.0)

## 2014-10-09 NOTE — Telephone Encounter (Signed)
Referral to [facility]. New patient referral form with labs, office note and insurance information faxed to [phone] with a request for an appointment in [facility]. Office number is [phone].

## 2014-10-09 NOTE — Progress Notes (Signed)
Case discussed with physician assistant. Complex liver lesions which have not been definitively characterized by imaging. Recommend referral to expert hepatologist for assistance in diagnosis and management

## 2014-10-09 NOTE — Patient Instructions (Addendum)
Referral will be made to [facility] Hepatology Dr. Janett Billow.   Normal BMI (Body Mass Index- based on height and weight) is between 19 and 25. Your BMI today is Body mass index is 41.78 kg/(m^2). Marland Kitchen Please consider follow up  regarding your BMI with your Primary Care Provider.

## 2014-10-09 NOTE — Progress Notes (Signed)
Patient ID: Kristy Chung, female   DOB: 29-Nov-1985, 29 y.o.   MRN: [id number]   Subjective:    Patient ID: Kristy Chung, female    DOB: 01-22-1986, 29 y.o.   MRN: [id number]  HPI Kristy Chung is a very nice 29 year old white female new to GI today referred by Debbrah Alar NP Velora Heckler  primary care for evaluation of abnormal MRI of the liver and recent complaints of abdominal pain. Patient has history of morbid obesity, polycystic ovarian syndrome, hypertension, adult onset diabetes mellitus, hypothyroidism, and hyperlipidemia. She says she had an episode of fairly intense burning epigastric pain about a month ago while she was at work. She says this was bad enough that she felt that she needed to be evaluated quickly. She had labs done which were unremarkable including liver tests. She then had an upper abdominal ultrasound which showed multiple hepatic lesions. And no gallstones. Subsequent MRI was done on 09/16/2014 and showed multiple liver lesions the largest measuring 4.2 x 2.9 cm in the right lobe of the liver and medial segments. These were felt to be most consistent with hepatic adenomas though could not rule out F Ann H felt less likely. Follow-up MRI in 3-6 months was recommended with Eovist contrast. One of these lesions with a pseudocapsule/rim enhancement which could represent some associated internal bleeding. Alpha-fetoprotein level was drawn and is normal at 2.4. Exline Patient had been started on Prilosec for the burning abdominal pain and this seems to have subsided. She's not really having any pain currently but says she has a pulling sensation in her right upper quadrant especially when trying to lie on her side. Because of her polycystic ovarian disease she had been on oral contraceptives over the past 14 years. He was just seen by her gynecologist yesterday and oral contraceptive birth control pill was stopped and she is to start Provera 5 days per month. She does not intend to  try to become pregnant over the next couple of years.  Review of Systems Pertinent positive and negative review of systems were noted in the above HPI section.  All other review of systems was otherwise negative.  Outpatient Encounter Prescriptions as of 10/09/2014  Medication Sig  . atorvastatin (LIPITOR) 20 MG tablet Take 1 tablet (20 mg total) by mouth daily.  . Blood Glucose Monitoring Suppl (TRUERESULT BLOOD GLUCOSE) W/DEVICE KIT Use as directed to check blood sugar  DX 790.29  . fluticasone (FLONASE) 50 MCG/ACT nasal spray Place 2 sprays into both nostrils daily.  . hydrochlorothiazide (HYDRODIURIL) 25 MG tablet Take 1 tablet (25 mg total) by mouth daily.  . Lancets MISC Use to check blood sugar once a day with TrueResult meter.  DX 790.29  . levothyroxine (SYNTHROID, LEVOTHROID) 50 MCG tablet TAKE 1 TABLET BY MOUTH DAILY  . medroxyPROGESTERone (PROVERA) 10 MG tablet Take 1 daily for 5 days monthly  . metFORMIN (GLUCOPHAGE) 500 MG tablet TAKE 1 TABLET BY MOUTH TWICE DAILY WITH A MEAL  . metoprolol succinate (TOPROL-XL) 25 MG 24 hr tablet TAKE 1/2 TABLET (12.5 MG TOTAL) BY MOUTH DAILY.  . Multiple Vitamin (MULTIVITAMIN) tablet Take 1 tablet by mouth daily.  Marland Kitchen omeprazole (PRILOSEC) 20 MG capsule Take 1 capsule (20 mg total) by mouth daily.  . TRUETEST TEST test strip TRUERESULT TEST STRIPS USE AS INSTRUCTED ONCE A DAY TO CHECK BLOOD SUGAR  . vitamin B-12 (CYANOCOBALAMIN) 1000 MCG tablet Take 1,000 mcg by mouth daily.  . Omega-3 Fatty Acids (FISH OIL) 1000 MG  CAPS Take 2,000 mg by mouth 2 (two) times daily.   No facility-administered encounter medications on file as of 10/09/2014.   Allergies  Allergen Reactions  . Amlodipine     flushing  . Other     CINNAMON GUM--blisters.   Patient Active Problem List   Diagnosis Date Noted  . Hepatic adenoma 09/16/2014  . Abdominal pain, epigastric 09/09/2014  . Hypothyroidism 02/08/2014  . Diabetes type 2, controlled 09/04/2013  . Skin  lesion 08/18/2012  . Migraine, unspecified, without mention of intractable migraine without mention of status migrainosus 08/03/2012  . Morbid obesity 08/03/2012  . Hyperlipemia 08/03/2012  . PCOS (polycystic ovarian syndrome) 08/03/2012  . HTN (hypertension) 08/03/2012   History   Social History  . Marital Status: Single    Spouse Name: N/A  . Number of Children: N/A  . Years of Education: N/A   Occupational History  . Not on file.   Social History Main Topics  . Smoking status: Never Smoker   . Smokeless tobacco: Never Used  . Alcohol Use: 0.0 oz/week    0 Standard drinks or equivalent per week     Comment: occasional  . Drug Use: No  . Sexual Activity: No     Comment: INTERCOURSE AGE 6, SEXUAL PARTNERS 5   Other Topics Concern  . 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 Ullom's family history includes AAA (abdominal aortic aneurysm) in her paternal grandfather; COPD in her paternal grandmother; Dementia in her paternal grandfather; Depression in her mother; Heart disease in her maternal grandfather, paternal grandfather, and paternal grandmother; Hyperlipidemia in her father, maternal grandfather, mother, and paternal grandmother; Hypertension in her father, maternal grandfather, and paternal grandmother; Hypothyroidism in her maternal grandmother.      Objective:    Filed Vitals:   10/09/14 1014  BP: 130/78  Pulse: 90    Physical Exam  well-developed obese young white female in no acute distress, quite pleasant blood pressure 130/78 pulse 90 height 5 foot 7 weight 266, BMI 41.7. HEENT; nontraumatic normocephalic EOMI PERRLA sclera anicteric, Supple; no JVD, Cardiovascular; regular rate and rhythm with S1-S2 no murmur or gallop, Pulmonary; clear bilaterally, Abdomen; large soft basically nontender at  present no palpable mass or hepatosplenomegaly no guarding or rebound bowel sounds present, Rectal; exam not done, Ext; no clubbing cyanosis or edema skin warm and dry, Psych; mood and affect appropriate       Assessment & Plan:   #1 29 yo female with multiple hepatic lesions most consistent with hepatic adenomas -largest currently less than 5 cm, one lesion on MRI showing some rim enhancement consistent with recent bleeding I suspect this may have caused recent episode of epigastric pain #2 PCOS - with 14 year hx  Of oral contraceptive use #3 morbid obesity #4 HTN #5 AODM #6 hypothyroid #7 hyperlipidemia  Plan; Long discussion with pt .Given multiple lesions , probable recent bleeding into on lesion, and potential  need for future resection etc feel she will be best served by referral to [facility] hepatology /Dr. Jola Babinski  For expert opinion and management- we will arrange.   She has already seen her GYN and stopped BCP, now being switched to provera Continue Prilosec 20 mg daily.  She is advised to seek ER eval  for any recurrent episodes of severe upper/RUQ pain.      Rosser Collington Genia Harold PA-C 10/09/2014   Cc: Debbrah Alar, NP

## 2014-10-11 ENCOUNTER — Other Ambulatory Visit: Payer: Self-pay | Admitting: Women's Health

## 2014-10-11 LAB — URINE CULTURE

## 2014-10-11 MED ORDER — SULFAMETHOXAZOLE-TRIMETHOPRIM 800-160 MG PO TABS: 1.0000 | ORAL_TABLET | Freq: Two times a day (BID) | ORAL | Status: DC

## 2014-10-17 NOTE — Telephone Encounter (Signed)
Confirmed with Kristy Chung the referral is in progress

## 2014-10-22 ENCOUNTER — Telehealth: Payer: Self-pay | Admitting: Physician Assistant

## 2014-10-22 NOTE — Telephone Encounter (Signed)
Spoke with the patient. She has not been scheduled yet. I called to the scheduler and spoke with [facility]. They are ready to schedule the patient. Rise Paganini asked me to give the direct phone number to the patient which is [phone].  I left this information on the patient's voicemail.

## 2014-11-07 ENCOUNTER — Telehealth: Payer: Self-pay | Admitting: Family

## 2014-11-07 ENCOUNTER — Telehealth: Payer: Self-pay | Admitting: *Deleted

## 2014-11-07 NOTE — Telephone Encounter (Signed)
Pt called and left message c/o UTI requesting medication, I called and left message to schedule OV

## 2014-11-07 NOTE — Telephone Encounter (Signed)
Pt called stating sx of uti. She was dx with uti a couple weeks ago by obgyn. She wanted to know if she could get antibiotics or come in for urine sample and if positive get antibiotics. Per Gilmore Laroche we didn't dx or treat so she would need OV. Advised pt to contact obgyn for antibiotics.

## 2014-11-08 ENCOUNTER — Telehealth: Payer: Self-pay | Admitting: Physician Assistant

## 2014-11-08 ENCOUNTER — Encounter: Payer: Self-pay | Admitting: Medical

## 2014-11-08 ENCOUNTER — Ambulatory Visit (INDEPENDENT_AMBULATORY_CARE_PROVIDER_SITE_OTHER): Payer: 59 | Admitting: Medical

## 2014-11-08 VITALS — BP 116/74 | HR 77 | Temp 98.3°F | Ht 67.0 in | Wt 268.2 lb

## 2014-11-08 DIAGNOSIS — R82998 Other abnormal findings in urine: Secondary | ICD-10-CM

## 2014-11-08 DIAGNOSIS — R300 Dysuria: Secondary | ICD-10-CM

## 2014-11-08 DIAGNOSIS — N390 Urinary tract infection, site not specified: Secondary | ICD-10-CM

## 2014-11-08 LAB — POCT URINALYSIS DIPSTICK
BILIRUBIN UA: 1
Blood, UA: NEGATIVE
Glucose, UA: NEGATIVE
Ketones, UA: NEGATIVE
NITRITE UA: NEGATIVE
PROTEIN UA: 15
Spec Grav, UA: >=1.03
UROBILINOGEN UA: 0.2
pH, UA: 5

## 2014-11-08 MED ORDER — PHENAZOPYRIDINE HCL 200 MG PO TABS: 200.0000 mg | ORAL_TABLET | Freq: Three times a day (TID) | ORAL | Status: DC | PRN

## 2014-11-08 MED ORDER — NITROFURANTOIN MONOHYD MACRO 100 MG PO CAPS: 100.0000 mg | ORAL_CAPSULE | Freq: Two times a day (BID) | ORAL | Status: DC

## 2014-11-08 NOTE — Assessment & Plan Note (Signed)
Your appear to have a urinary tract infection . I am prescribing macrobid  antibiotic for the probable infection. Hydrate well. I am sending out a urine culture. During the interim if your signs and symptoms worsen rather than improving please notify us. We will notify your when the culture results are back.  Follow up in 7 days or as needed. 

## 2014-11-08 NOTE — Patient Instructions (Addendum)
UTI (urinary tract infection) Your appear to have a urinary tract infection. I am prescribing macrobid  antibiotic for the probable infection. Hydrate well. I am sending out a urine culture. During the interim if your signs and symptoms worsen rather than improving please notify us. We will notify your when the culture results are back.  Follow up in 7 days or as needed.

## 2014-11-08 NOTE — Telephone Encounter (Signed)
The patient does not want to go back to the [facility] clinic. I have referred her to [facility].

## 2014-11-08 NOTE — Progress Notes (Signed)
Pre visit review using our clinic review tool, if applicable. No additional management support is needed unless otherwise documented below in the visit note. 

## 2014-11-08 NOTE — Progress Notes (Signed)
Subjective:    Patient ID: Kristy Chung, female    DOB: 1985-10-05, 29 y.o.   MRN: [id number]  HPI   Pt in today reporting urinary symptom x 1 day.  Dysuria- yes Frequent urination-yes(dark,cloudy and  Has odor) Hesitancy-no Suprapubic pressure-no Fever-no chills-no Nausea-no Vomiting-no CVA pain-no History of UTI-1 month ago had uti. 7 years since any infection except the one 1 month ago. Gross hematuria-no  lmp- last Tuesday.   Review of Systems  Constitutional: Negative for fever, chills and fatigue.  Respiratory: Negative for cough, chest tightness and wheezing.   Cardiovascular: Negative for chest pain.  Gastrointestinal: Negative for abdominal pain.  Genitourinary: Positive for dysuria and frequency. Negative for urgency, hematuria and difficulty urinating.  Neurological: Negative for dizziness and headaches.  Hematological: Negative for adenopathy. Does not bruise/bleed easily.  Psychiatric/Behavioral: Negative for behavioral problems and confusion.    Past Medical History  Diagnosis Date  . History of chicken pox   . Allergy     History   Social History  . Marital Status: Single    Spouse Name: N/A  . Number of Children: N/A  . Years of Education: N/A   Occupational History  . Not on file.   Social History Main Topics  . Smoking status: Never Smoker   . Smokeless tobacco: Never Used  . Alcohol Use: 0.0 oz/week    0 Standard drinks or equivalent per week     Comment: occasional  . Drug Use: No  . Sexual Activity: No     Comment: INTERCOURSE AGE 82, SEXUAL PARTNERS 5   Other Topics Concern  . 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 Wisdom tooth extraction  2005    Family History  Problem Relation Age of Onset  .  Hyperlipidemia Mother   . Depression Mother   . Hyperlipidemia Father   . Hypertension Father   . Hypothyroidism Maternal Grandmother   . Heart disease Maternal Grandfather     MI, stent, CAD  . Hypertension Maternal Grandfather   . Hyperlipidemia Maternal Grandfather   . COPD Paternal Grandmother   . Hypertension Paternal Grandmother   . Hyperlipidemia Paternal Grandmother   . Heart disease Paternal Grandmother   . AAA (abdominal aortic aneurysm) Paternal Grandfather   . Dementia Paternal Grandfather   . Heart disease Paternal Grandfather     Allergies  Allergen Reactions  . Amlodipine     flushing  . Other     CINNAMON GUM--blisters.    Current Outpatient Prescriptions on File Prior to Visit  Medication Sig Dispense Refill  . atorvastatin (LIPITOR) 20 MG tablet Take 1 tablet (20 mg total) by mouth daily. 90 tablet 1  . Blood Glucose Monitoring Suppl (TRUERESULT BLOOD GLUCOSE) W/DEVICE KIT Use as directed to check blood sugar  DX 790.29 1 each 0  . fluticasone (FLONASE) 50 MCG/ACT nasal spray Place 2 sprays into both nostrils daily. 16 g 6  . hydrochlorothiazide (HYDRODIURIL) 25 MG tablet Take 1 tablet (25 mg total) by mouth daily. 90 tablet 1  . Lancets MISC Use to check blood sugar once a day with TrueResult meter.  DX 790.29 100 each 2  . levothyroxine (SYNTHROID, LEVOTHROID) 50 MCG tablet TAKE 1  TABLET BY MOUTH DAILY 90 tablet 1  . medroxyPROGESTERone (PROVERA) 10 MG tablet Take 1 daily for 5 days monthly 15 tablet 4  . metFORMIN (GLUCOPHAGE) 500 MG tablet TAKE 1 TABLET BY MOUTH TWICE DAILY WITH A MEAL 180 tablet 1  . metoprolol succinate (TOPROL-XL) 25 MG 24 hr tablet TAKE 1/2 TABLET (12.5 MG TOTAL) BY MOUTH DAILY. 45 tablet 1  . Multiple Vitamin (MULTIVITAMIN) tablet Take 1 tablet by mouth daily.    . Omega-3 Fatty Acids (FISH OIL) 1000 MG CAPS Take 2,000 mg by mouth 2 (two) times daily.    Marland Kitchen omeprazole (PRILOSEC) 20 MG capsule Take 1 capsule (20 mg total) by mouth  daily. 30 capsule 2  . sulfamethoxazole-trimethoprim (BACTRIM DS,SEPTRA DS) 800-160 MG per tablet Take 1 tablet by mouth 2 (two) times daily. 6 tablet 0  . TRUETEST TEST test strip TRUERESULT TEST STRIPS USE AS INSTRUCTED ONCE A DAY TO CHECK BLOOD SUGAR 100 each 1  . vitamin B-12 (CYANOCOBALAMIN) 1000 MCG tablet Take 1,000 mcg by mouth daily.     No current facility-administered medications on file prior to visit.    BP 116/74 mmHg  Pulse 77  Temp(Src) 98.3 F (36.8 C) (Oral)  Ht 5' 7"  (1.702 m)  Wt 268 lb 3.2 oz (121.655 kg)  BMI 42.00 kg/m2  SpO2 100%  LMP 10/29/2014        Objective:   Physical Exam  General  Mental Status- Alert. Orientation- Orientation x 4.   Skin General:- Normal. Moisture- Dry. Temperature- Warm.   Heart Ausculation-RRR  Lungs Ausculation- Clear, even, unlabored bilaterlly.    Abdomen Palpation/Percussion: Palpation and Percussion of the abdomen reveal- faint suprapbuic pressure/ Tender, No Rebound tenderness, No Rigidity(guarding), No Palpable abdominal masses and No jar tenderness. No suprapubic tenderness. Liver:-Normal. Spleen:- Normal. Other Characteristics- No Costovertebral angle tenderness- Left or Costovertebral angle tenderness- Right.  Auscultation: Auscultation of the abdomen reveals- Bowel Sounds normal.  Back- no cva tenderness      Assessment & Plan:

## 2014-11-10 LAB — URINE CULTURE: Colony Count: 70000

## 2014-11-11 NOTE — Telephone Encounter (Signed)
ok 

## 2014-11-12 ENCOUNTER — Telehealth: Payer: Self-pay | Admitting: Family

## 2014-11-12 NOTE — Telephone Encounter (Signed)
Relation to pt: self  Call back number:[phone]   Reason for call:  Pt returning your call informing you symptoms have improved

## 2014-11-15 ENCOUNTER — Telehealth: Payer: Self-pay | Admitting: Physician Assistant

## 2014-11-15 NOTE — Telephone Encounter (Signed)
Records faxed again today.

## 2014-11-19 NOTE — Telephone Encounter (Signed)
Patient has an appointment.

## 2014-12-05 ENCOUNTER — Telehealth: Payer: Self-pay | Admitting: *Deleted

## 2014-12-05 NOTE — Telephone Encounter (Signed)
Okay, thanks

## 2014-12-05 NOTE — Telephone Encounter (Signed)
Pt said you call her to follow-up? She called back and said she is seeing a specialist on 12/16/14 and will call you back after that appointment to relay information to you

## 2014-12-17 ENCOUNTER — Other Ambulatory Visit: Payer: Self-pay | Admitting: Nurse Practitioner

## 2014-12-17 DIAGNOSIS — D134 Benign neoplasm of liver: Secondary | ICD-10-CM

## 2014-12-24 ENCOUNTER — Other Ambulatory Visit: Payer: Self-pay | Admitting: Family

## 2014-12-24 ENCOUNTER — Telehealth: Payer: Self-pay | Admitting: *Deleted

## 2014-12-24 NOTE — Telephone Encounter (Signed)
Stop medicine now, take Benadryl. Review headaches are a common side effect with Provera, skin itching is not, not to say is not related. If no cycle after 2 days of taking Provera, wait until next month and then if no cycle in September take provera in October, if headache occurs again only take the one dose and call.

## 2014-12-24 NOTE — Telephone Encounter (Signed)
Pt informed with the below note. 

## 2014-12-24 NOTE — Telephone Encounter (Signed)
Pt takes Provera 10 mg by mouth daily 5 days each month if no cycle, pt c/o terrible headaches and itching of hands and feet. Pt said she had a cycle in May and June, no cycle in July so she started medication on 12/22/14. Please advise

## 2015-03-19 ENCOUNTER — Ambulatory Visit
Admission: RE | Admit: 2015-03-19 | Discharge: 2015-03-19 | Disposition: A | Discharge status: Home / Self Care | Payer: 59 | Source: Ambulatory Visit | Attending: Nurse Practitioner | Admitting: Nurse Practitioner

## 2015-03-19 DIAGNOSIS — D134 Benign neoplasm of liver: Secondary | ICD-10-CM

## 2015-03-19 MED ORDER — GADOXETATE DISODIUM 0.25 MMOL/ML IV SOLN
10.0000 mL | Freq: Once | INTRAVENOUS | Status: AC | PRN
  Administered 2015-03-19: 10 mL via INTRAVENOUS

## 2015-04-22 ENCOUNTER — Encounter: Payer: Self-pay | Admitting: Family

## 2015-04-22 ENCOUNTER — Ambulatory Visit (INDEPENDENT_AMBULATORY_CARE_PROVIDER_SITE_OTHER): Payer: 59 | Admitting: Family

## 2015-04-22 VITALS — BP 117/71 | HR 76 | Temp 98.0°F | Resp 16 | Ht 67.5 in | Wt 278.4 lb

## 2015-04-22 DIAGNOSIS — E039 Hypothyroidism, unspecified: Secondary | ICD-10-CM | POA: Diagnosis not present

## 2015-04-22 DIAGNOSIS — E785 Hyperlipidemia, unspecified: Secondary | ICD-10-CM

## 2015-04-22 DIAGNOSIS — I10 Essential (primary) hypertension: Secondary | ICD-10-CM

## 2015-04-22 DIAGNOSIS — E119 Type 2 diabetes mellitus without complications: Secondary | ICD-10-CM

## 2015-04-22 DIAGNOSIS — D134 Benign neoplasm of liver: Secondary | ICD-10-CM

## 2015-04-22 LAB — LIPID PANEL
CHOL/HDL RATIO: 5
Cholesterol: 171 mg/dL (ref 0–200)
HDL: 36 mg/dL — ABNORMAL LOW (ref >39.00)
NonHDL: 135.34
Triglycerides: 345 mg/dL — ABNORMAL HIGH (ref 0.0–149.0)
VLDL: 69 mg/dL — AB (ref 0.0–40.0)

## 2015-04-22 LAB — BASIC METABOLIC PANEL
BUN: 9 mg/dL (ref 6–23)
CALCIUM: 9.3 mg/dL (ref 8.4–10.5)
CO2: 24 mEq/L (ref 19–32)
Chloride: 102 mEq/L (ref 96–112)
Creatinine, Ser: 0.51 mg/dL (ref 0.40–1.20)
GFR: 151.17 mL/min (ref >60.00)
GLUCOSE: 101 mg/dL — AB (ref 70–99)
Potassium: 4 mEq/L (ref 3.5–5.1)
Sodium: 137 mEq/L (ref 135–145)

## 2015-04-22 LAB — TSH: TSH: 1.94 u[IU]/mL (ref 0.35–4.50)

## 2015-04-22 LAB — MICROALBUMIN / CREATININE URINE RATIO
CREATININE, U: 140.2 mg/dL
Microalb Creat Ratio: 0.5 mg/g (ref 0.0–30.0)

## 2015-04-22 LAB — HEMOGLOBIN A1C: Hgb A1c MFr Bld: 6.3 % (ref 4.6–6.5)

## 2015-04-22 LAB — LDL CHOLESTEROL, DIRECT: Direct LDL: 96 mg/dL

## 2015-04-22 MED ORDER — METFORMIN HCL 500 MG PO TABS: ORAL_TABLET | ORAL | Status: DC

## 2015-04-22 MED ORDER — HYDROCHLOROTHIAZIDE 25 MG PO TABS: 25.0000 mg | ORAL_TABLET | Freq: Every day | ORAL | Status: DC

## 2015-04-22 MED ORDER — METOPROLOL SUCCINATE ER 25 MG PO TB24: ORAL_TABLET | ORAL | Status: DC

## 2015-04-22 MED ORDER — LEVOTHYROXINE SODIUM 50 MCG PO TABS: ORAL_TABLET | ORAL | Status: DC

## 2015-04-22 MED ORDER — ATORVASTATIN CALCIUM 20 MG PO TABS: 20.0000 mg | ORAL_TABLET | Freq: Every day | ORAL | Status: DC

## 2015-04-22 NOTE — Assessment & Plan Note (Signed)
Stable on metformin, continue same, obtain a1c.  

## 2015-04-22 NOTE — Progress Notes (Signed)
Subjective:    Patient ID: Kristy Chung, female    DOB: Mar 11, 1986, 29 y.o.   MRN: [id number]  HPI  Kristy Chung is a 29 yr old female who presents today for follow up.   1) Hyperlipidemia- maintained on lipitor- denies myalgia Lab Results  Component Value Date   CHOL 161 08/21/2014   HDL 41.90 08/21/2014   LDLCALC 80 08/21/2014   LDLDIRECT 154.5 02/08/2014   TRIG 198.0* 08/21/2014   CHOLHDL 4 08/21/2014   2) HTN- current blood pressure meds include hctz and toprol xl.   3) DM2- maintained on metformin. Rarely checks sugars.  Lab Results  Component Value Date   HGBA1C 6.1 08/21/2014   HGBA1C 6.5 02/08/2014   HGBA1C 6.8* 09/03/2013   Lab Results  Component Value Date   MICROALBUR 0.63 09/21/2013   LDLCALC 80 08/21/2014   CREATININE 0.60 08/21/2014   4) Hypothyroid- maintained on synthroid- reports feeling well on dose.  Lab Results  Component Value Date   TSH 2.29 08/21/2014   5) Liver adenomas- followed by hepatology clinic.  Off of OCP's.  All adenomas have decreased in size on imaging.     Review of Systems    see HPI  Past Medical History  Diagnosis Date  . History of chicken pox   . Allergy     Social History   Social History  . Marital Status: Single    Spouse Name: N/A  . Number of Children: N/A  . Years of Education: N/A   Occupational History  . Not on file.   Social History Main Topics  . Smoking status: Never Smoker   . Smokeless tobacco: Never Used  . Alcohol Use: 0.0 oz/week    0 Standard drinks or equivalent per week     Comment: occasional  . Drug Use: No  . Sexual Activity: No     Comment: INTERCOURSE AGE 67, SEXUAL PARTNERS 5   Other Topics Concern  . 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 Wisdom tooth  extraction  2005    Family History  Problem Relation Age of Onset  . Hyperlipidemia Mother   . Depression Mother   . Hyperlipidemia Father   . Hypertension Father   . Hypothyroidism Maternal Grandmother   . Heart disease Maternal Grandfather     MI, stent, CAD  . Hypertension Maternal Grandfather   . Hyperlipidemia Maternal Grandfather   . COPD Paternal Grandmother   . Hypertension Paternal Grandmother   . Hyperlipidemia Paternal Grandmother   . Heart disease Paternal Grandmother   . AAA (abdominal aortic aneurysm) Paternal Grandfather   . Dementia Paternal Grandfather   . Heart disease Paternal Grandfather     Allergies  Allergen Reactions  . Amlodipine     flushing  . Other     CINNAMON GUM--blisters.    Current Outpatient Prescriptions on File Prior to Visit  Medication Sig Dispense Refill  . atorvastatin (LIPITOR) 20 MG tablet Take 1 tablet (20 mg total) by mouth daily. 90 tablet 1  . Blood Glucose Monitoring Suppl (TRUERESULT BLOOD GLUCOSE) W/DEVICE KIT Use as directed to check blood sugar  DX 790.29 1 each 0  . fluticasone (FLONASE) 50 MCG/ACT nasal spray Place  2 sprays into both nostrils daily. 16 g 6  . hydrochlorothiazide (HYDRODIURIL) 25 MG tablet Take 1 tablet (25 mg total) by mouth daily. 90 tablet 1  . Lancets MISC Use to check blood sugar once a day with TrueResult meter.  DX 790.29 100 each 2  . levothyroxine (SYNTHROID, LEVOTHROID) 50 MCG tablet TAKE 1 TABLET BY MOUTH DAILY 90 tablet 1  . medroxyPROGESTERone (PROVERA) 10 MG tablet Take 1 daily for 5 days monthly 15 tablet 4  . metFORMIN (GLUCOPHAGE) 500 MG tablet TAKE 1 TABLET BY MOUTH TWICE DAILY WITH A MEAL 180 tablet 1  . metoprolol succinate (TOPROL-XL) 25 MG 24 hr tablet TAKE 1/2 TABLET (12.5 MG TOTAL) BY MOUTH DAILY. 45 tablet 1  . Multiple Vitamin (MULTIVITAMIN) tablet Take 1 tablet by mouth daily.    . Omega-3 Fatty Acids (FISH OIL) 1000 MG CAPS Take 2,000 mg by mouth 2 (two) times daily.    Marland Kitchen  omeprazole (PRILOSEC) 20 MG capsule TAKE 1 CAPSULE (20 MG TOTAL) BY MOUTH DAILY. 30 capsule 5  . TRUETEST TEST test strip TRUERESULT TEST STRIPS USE AS INSTRUCTED ONCE A DAY TO CHECK BLOOD SUGAR 100 each 1  . vitamin B-12 (CYANOCOBALAMIN) 1000 MCG tablet Take 1,000 mcg by mouth daily.     No current facility-administered medications on file prior to visit.    Ht 5' 7.5" (1.715 m)  Wt 278 lb 6.4 oz (126.281 kg)  BMI 42.93 kg/m2    Objective:   Physical Exam  Constitutional: She is oriented to person, place, and time. She appears well-developed and well-nourished.  HENT:  Head: Normocephalic and atraumatic.  Cardiovascular: Normal rate, regular rhythm and normal heart sounds.   No murmur heard. Pulmonary/Chest: Effort normal and breath sounds normal. No respiratory distress. She has no wheezes.  Musculoskeletal: She exhibits no edema.  Neurological: She is alert and oriented to person, place, and time.  Skin: Skin is warm and dry.  Psychiatric: She has a normal mood and affect. Her behavior is normal. Judgment and thought content normal.          Assessment & Plan:

## 2015-04-22 NOTE — Assessment & Plan Note (Signed)
Clinically stable, obtain tsh, continue synthroid.  

## 2015-04-22 NOTE — Patient Instructions (Signed)
Please complete lab work prior to leaving.   

## 2015-04-22 NOTE — Assessment & Plan Note (Signed)
Trigs elevated.  Repeat flp today, continue statin, pt not sexually active, reminded pt not to become pregnant on statin- could consider mirena in the future if she does become sexually.

## 2015-04-22 NOTE — Progress Notes (Signed)
Pre visit review using our clinic review tool, if applicable. No additional management support is needed unless otherwise documented below in the visit note. 

## 2015-04-22 NOTE — Assessment & Plan Note (Addendum)
BP stable.  Stable on current meds, continue same.

## 2015-04-22 NOTE — Assessment & Plan Note (Signed)
Improving off of OCP's, management per hepatology.

## 2015-04-24 MED ORDER — FENOFIBRATE 145 MG PO TABS: 145.0000 mg | ORAL_TABLET | Freq: Every day | ORAL | Status: DC

## 2015-04-25 NOTE — Addendum Note (Signed)
Addended by: Debbrah Alar on: 04/25/2015 08:30 AM   Modules accepted: Orders, Medications

## 2015-09-01 ENCOUNTER — Other Ambulatory Visit: Payer: Self-pay | Admitting: Family

## 2015-09-01 MED FILL — OMEPRAZOLE DR 20 MG CAPSULE: 20 | 30 days supply | Qty: 30 | Fill #0

## 2015-09-01 MED FILL — MEDROXYPROGESTERONE 10 MG T: 10 | 90 days supply | Qty: 15 | Fill #1

## 2015-09-01 MED FILL — LEVOTHYROXINE 50 MCG TABLET: 50 | 90 days supply | Qty: 90 | Fill #1

## 2015-09-01 MED FILL — METOPROLOL SUCC ER 25 MG TA: 25 | 90 days supply | Qty: 45 | Fill #1

## 2015-09-11 DIAGNOSIS — H52223 Regular astigmatism, bilateral: Secondary | ICD-10-CM | POA: Diagnosis not present

## 2015-09-11 DIAGNOSIS — E119 Type 2 diabetes mellitus without complications: Secondary | ICD-10-CM | POA: Diagnosis not present

## 2015-09-11 DIAGNOSIS — H5213 Myopia, bilateral: Secondary | ICD-10-CM | POA: Diagnosis not present

## 2015-09-11 LAB — HM DIABETES EYE EXAM

## 2015-09-29 MED FILL — OMEPRAZOLE DR 20 MG CAPSULE: 20 | 30 days supply | Qty: 30 | Fill #1

## 2015-09-29 MED FILL — HYDROCHLOROTHIAZIDE 25 MG T: 25 | 90 days supply | Qty: 90 | Fill #1

## 2015-09-29 MED FILL — metFORMIN HCL 500 MG TABS: 500 | 90 days supply | Qty: 180 | Fill #1

## 2015-10-07 ENCOUNTER — Telehealth: Payer: Self-pay | Admitting: *Deleted

## 2015-10-07 NOTE — Telephone Encounter (Signed)
Report received, health maintenance update and forwarded to PCP for review.

## 2015-10-07 NOTE — Telephone Encounter (Signed)
Received report from [facility] but diabetic retinopathy section was left blank. Spoke with Malachy Mood at [phone] and requested updated / addended report be faxed to the nurses station. Awaiting report.

## 2015-10-21 ENCOUNTER — Ambulatory Visit (INDEPENDENT_AMBULATORY_CARE_PROVIDER_SITE_OTHER): Payer: 59 | Admitting: Women's Health

## 2015-10-21 ENCOUNTER — Encounter: Payer: Self-pay | Admitting: Women's Health

## 2015-10-21 VITALS — BP 118/78 | Ht 67.0 in | Wt 278.0 lb

## 2015-10-21 DIAGNOSIS — L680 Hirsutism: Secondary | ICD-10-CM | POA: Diagnosis not present

## 2015-10-21 DIAGNOSIS — Z01419 Encounter for gynecological examination (general) (routine) without abnormal findings: Secondary | ICD-10-CM | POA: Diagnosis not present

## 2015-10-21 MED ORDER — SPIRONOLACTONE 50 MG PO TABS: 50.0000 mg | ORAL_TABLET | Freq: Every day | ORAL | Status: DC

## 2015-10-21 MED FILL — SPIRONOLACTONE 50 MG TABLET: 50 | 90 days supply | Qty: 90 | Fill #0

## 2015-10-21 NOTE — Progress Notes (Signed)
Kristy Chung 04/24/1986 [id number]    History:    Presents for annual exam.  Irregular cycles/PCO S. December and January spontaneous cycles. March cycle after Provera, spontaneous cycles in April and May. Has not been sexually active in years. Diabetes, hypertension, hypercholesterolemia, thyroid managed by primary care. Gardasil series completed. History of hepatic adenoma stopped birth control pills in 2016 and liver adenoma shrank.  Past medical history, past surgical history, family history and social history were all reviewed and documented in the EPIC chart. Nurse NICU. Father hypertension, appearance hypercholesterolemia.  ROS:  A ROS was performed and pertinent positives and negatives are included.  Exam:  Filed Vitals:   10/21/15 1131  BP: 118/78    General appearance:  Normal Thyroid:  Symmetrical, normal in size, without palpable masses or nodularity. Respiratory  Auscultation:  Clear without wheezing or rhonchi Cardiovascular  Auscultation:  Regular rate, without rubs, murmurs or gallops  Edema/varicosities:  Not grossly evident Abdominal  Soft,nontender, without masses, guarding or rebound.  Liver/spleen:  No organomegaly noted  Hernia:  None appreciated  Skin  Inspection:  Grossly normal   Breasts: Examined lying and sitting.     Right: Without masses, retractions, discharge or axillary adenopathy.     Left: Without masses, retractions, discharge or axillary adenopathy. Gentitourinary   Inguinal/mons:  Normal without inguinal adenopathy  External genitalia:  Normal  BUS/Urethra/Skene's glands:  Normal  Vagina:  Normal  Cervix:  Normal  Uterus:  normal in size, shape and contour.  Midline and mobile Limited exam due to obesity  Adnexa/parametria:     Rt: Without masses or tenderness.   Lt: Without masses or tenderness.  Anus and perineum: Normal  Digital rectal exam: Normal sphincter tone without palpated masses or tenderness  Assessment/Plan:  30 y.o.  S WF G0 for annual exam.     Irregular cycles/PCO S Morbid obesity Diabetes/hypertension/hypercholesterolemia/hyperthyroidism-primary care manages labs and meds  Plan: Reviewed metformin is often helpful with PCOS. Will continue Provera 10 mg by mouth for 5 days if no cycle greater than 2 months. Contraception options reviewed of IUD or condoms. SBE's, reviewed importance of decreasing calories and increasing exercise for weight loss. Encouraged to see dietitian. Pap normal 2015, reviewed new screening guidelines.    Huel Cote WHNP, 1:13 PM 10/21/2015

## 2015-10-21 NOTE — Patient Instructions (Signed)
Health Maintenance, Female Adopting a healthy lifestyle and getting preventive care can go a long way to promote health and wellness. Talk with your health care provider about what schedule of regular examinations is right for you. This is a good chance for you to check in with your provider about disease prevention and staying healthy. In between checkups, there are plenty of things you can do on your own. Experts have done a lot of research about which lifestyle changes and preventive measures are most likely to keep you healthy. Ask your health care provider for more information. WEIGHT AND DIET  Eat a healthy diet  Be sure to include plenty of vegetables, fruits, low-fat dairy products, and lean protein.  Do not eat a lot of foods high in solid fats, added sugars, or salt.  Get regular exercise. This is one of the most important things you can do for your health.  Most adults should exercise for at least 150 minutes each week. The exercise should increase your heart rate and make you sweat (moderate-intensity exercise).  Most adults should also do strengthening exercises at least twice a week. This is in addition to the moderate-intensity exercise.  Maintain a healthy weight  Body mass index (BMI) is a measurement that can be used to identify possible weight problems. It estimates body fat based on height and weight. Your health care provider can help determine your BMI and help you achieve or maintain a healthy weight.  For females 20 years of age and older:   A BMI below 18.5 is considered underweight.  A BMI of 18.5 to 24.9 is normal.  A BMI of 25 to 29.9 is considered overweight.  A BMI of 30 and above is considered obese.  Watch levels of cholesterol and blood lipids  You should start having your blood tested for lipids and cholesterol at 30 years of age, then have this test every 5 years.  You may need to have your cholesterol levels checked more often if:  Your lipid  or cholesterol levels are high.  You are older than 30 years of age.  You are at high risk for heart disease.  CANCER SCREENING   Lung Cancer  Lung cancer screening is recommended for adults 55-80 years old who are at high risk for lung cancer because of a history of smoking.  A yearly low-dose CT scan of the lungs is recommended for people who:  Currently smoke.  Have quit within the past 15 years.  Have at least a 30-pack-year history of smoking. A pack year is smoking an average of one pack of cigarettes a day for 1 year.  Yearly screening should continue until it has been 15 years since you quit.  Yearly screening should stop if you develop a health problem that would prevent you from having lung cancer treatment.  Breast Cancer  Practice breast self-awareness. This means understanding how your breasts normally appear and feel.  It also means doing regular breast self-exams. Let your health care provider know about any changes, no matter how small.  If you are in your 20s or 30s, you should have a clinical breast exam (CBE) by a health care provider every 1-3 years as part of a regular health exam.  If you are 40 or older, have a CBE every year. Also consider having a breast X-ray (mammogram) every year.  If you have a family history of breast cancer, talk to your health care provider about genetic screening.  If you   are at high risk for breast cancer, talk to your health care provider about having an MRI and a mammogram every year.  Breast cancer gene (BRCA) assessment is recommended for women who have family members with BRCA-related cancers. BRCA-related cancers include:  Breast.  Ovarian.  Tubal.  Peritoneal cancers.  Results of the assessment will determine the need for genetic counseling and BRCA1 and BRCA2 testing. Cervical Cancer Your health care provider may recommend that you be screened regularly for cancer of the pelvic organs (ovaries, uterus, and  vagina). This screening involves a pelvic examination, including checking for microscopic changes to the surface of your cervix (Pap test). You may be encouraged to have this screening done every 3 years, beginning at age 21.  For women ages 30-65, health care providers may recommend pelvic exams and Pap testing every 3 years, or they may recommend the Pap and pelvic exam, combined with testing for human papilloma virus (HPV), every 5 years. Some types of HPV increase your risk of cervical cancer. Testing for HPV may also be done on women of any age with unclear Pap test results.  Other health care providers may not recommend any screening for nonpregnant women who are considered low risk for pelvic cancer and who do not have symptoms. Ask your health care provider if a screening pelvic exam is right for you.  If you have had past treatment for cervical cancer or a condition that could lead to cancer, you need Pap tests and screening for cancer for at least 20 years after your treatment. If Pap tests have been discontinued, your risk factors (such as having a new sexual partner) need to be reassessed to determine if screening should resume. Some women have medical problems that increase the chance of getting cervical cancer. In these cases, your health care provider may recommend more frequent screening and Pap tests. Colorectal Cancer  This type of cancer can be detected and often prevented.  Routine colorectal cancer screening usually begins at 30 years of age and continues through 30 years of age.  Your health care provider may recommend screening at an earlier age if you have risk factors for colon cancer.  Your health care provider may also recommend using home test kits to check for hidden blood in the stool.  A small camera at the end of a tube can be used to examine your colon directly (sigmoidoscopy or colonoscopy). This is done to check for the earliest forms of colorectal  cancer.  Routine screening usually begins at age 50.  Direct examination of the colon should be repeated every 5-10 years through 30 years of age. However, you may need to be screened more often if early forms of precancerous polyps or small growths are found. Skin Cancer  Check your skin from head to toe regularly.  Tell your health care provider about any new moles or changes in moles, especially if there is a change in a mole's shape or color.  Also tell your health care provider if you have a mole that is larger than the size of a pencil eraser.  Always use sunscreen. Apply sunscreen liberally and repeatedly throughout the day.  Protect yourself by wearing long sleeves, pants, a wide-brimmed hat, and sunglasses whenever you are outside. HEART DISEASE, DIABETES, AND HIGH BLOOD PRESSURE   High blood pressure causes heart disease and increases the risk of stroke. High blood pressure is more likely to develop in:  People who have blood pressure in the high end   of the normal range (130-139/85-89 mm Hg).  People who are overweight or obese.  People who are African American.  If you are 38-23 years of age, have your blood pressure checked every 3-5 years. If you are 61 years of age or older, have your blood pressure checked every year. You should have your blood pressure measured twice--once when you are at a hospital or clinic, and once when you are not at a hospital or clinic. Record the average of the two measurements. To check your blood pressure when you are not at a hospital or clinic, you can use:  An automated blood pressure machine at a pharmacy.  A home blood pressure monitor.  If you are between 45 years and 39 years old, ask your health care provider if you should take aspirin to prevent strokes.  Have regular diabetes screenings. This involves taking a blood sample to check your fasting blood sugar level.  If you are at a normal weight and have a low risk for diabetes,  have this test once every three years after 30 years of age.  If you are overweight and have a high risk for diabetes, consider being tested at a younger age or more often. PREVENTING INFECTION  Hepatitis B  If you have a higher risk for hepatitis B, you should be screened for this virus. You are considered at high risk for hepatitis B if:  You were born in a country where hepatitis B is common. Ask your health care provider which countries are considered high risk.  Your parents were born in a high-risk country, and you have not been immunized against hepatitis B (hepatitis B vaccine).  You have HIV or AIDS.  You use needles to inject street drugs.  You live with someone who has hepatitis B.  You have had sex with someone who has hepatitis B.  You get hemodialysis treatment.  You take certain medicines for conditions, including cancer, organ transplantation, and autoimmune conditions. Hepatitis C  Blood testing is recommended for:  Everyone born from [birth year] through 1965.  Anyone with known risk factors for hepatitis C. Sexually transmitted infections (STIs)  You should be screened for sexually transmitted infections (STIs) including gonorrhea and chlamydia if:  You are sexually active and are younger than 30 years of age.  You are older than 30 years of age and your health care provider tells you that you are at risk for this type of infection.  Your sexual activity has changed since you were last screened and you are at an increased risk for chlamydia or gonorrhea. Ask your health care provider if you are at risk.  If you do not have HIV, but are at risk, it may be recommended that you take a prescription medicine daily to prevent HIV infection. This is called pre-exposure prophylaxis (PrEP). You are considered at risk if:  You are sexually active and do not regularly use condoms or know the HIV status of your partner(s).  You take drugs by injection.  You are sexually  active with a partner who has HIV. Talk with your health care provider about whether you are at high risk of being infected with HIV. If you choose to begin PrEP, you should first be tested for HIV. You should then be tested every 3 months for as long as you are taking PrEP.  PREGNANCY   If you are premenopausal and you may become pregnant, ask your health care provider about preconception counseling.  If you may  become pregnant, take 400 to 800 micrograms (mcg) of folic acid every day.  If you want to prevent pregnancy, talk to your health care provider about birth control (contraception). OSTEOPOROSIS AND MENOPAUSE   Osteoporosis is a disease in which the bones lose minerals and strength with aging. This can result in serious bone fractures. Your risk for osteoporosis can be identified using a bone density scan.  If you are 61 years of age or older, or if you are at risk for osteoporosis and fractures, ask your health care provider if you should be screened.  Ask your health care provider whether you should take a calcium or vitamin D supplement to lower your risk for osteoporosis.  Menopause may have certain physical symptoms and risks.  Hormone replacement therapy may reduce some of these symptoms and risks. Talk to your health care provider about whether hormone replacement therapy is right for you.  HOME CARE INSTRUCTIONS   Schedule regular health, dental, and eye exams.  Stay current with your immunizations.   Do not use any tobacco products including cigarettes, chewing tobacco, or electronic cigarettes.  If you are pregnant, do not drink alcohol.  If you are breastfeeding, limit how much and how often you drink alcohol.  Limit alcohol intake to no more than 1 drink per day for nonpregnant women. One drink equals 12 ounces of beer, 5 ounces of wine, or 1 ounces of hard liquor.  Do not use street drugs.  Do not share needles.  Ask your health care provider for help if  you need support or information about quitting drugs.  Tell your health care provider if you often feel depressed.  Tell your health care provider if you have ever been abused or do not feel safe at home.   This information is not intended to replace advice given to you by your health care provider. Make sure you discuss any questions you have with your health care provider.   Document Released: 11/16/2010 Document Revised: 05/24/2014 Document Reviewed: 04/04/2013 Elsevier Interactive Patient Education [employer].

## 2015-10-24 ENCOUNTER — Encounter: Payer: Self-pay | Admitting: Family

## 2015-11-04 MED FILL — OMEPRAZOLE DR 20 MG CAPSULE: 20 | 30 days supply | Qty: 30 | Fill #2

## 2015-11-04 MED FILL — ATORVASTATIN 20 MG TABLET: 20 | 90 days supply | Qty: 90 | Fill #1

## 2016-01-09 ENCOUNTER — Other Ambulatory Visit: Payer: Self-pay | Admitting: Nurse Practitioner

## 2016-01-09 DIAGNOSIS — D134 Benign neoplasm of liver: Secondary | ICD-10-CM

## 2016-01-15 ENCOUNTER — Other Ambulatory Visit: Payer: Self-pay | Admitting: Family

## 2016-01-15 MED FILL — METOPROLOL SUCC ER 25 MG TA: 25 | 30 days supply | Qty: 15 | Fill #0

## 2016-01-15 MED FILL — LEVOTHYROXINE 50 MCG TABLET: 50 | 30 days supply | Qty: 30 | Fill #0

## 2016-01-15 MED FILL — OMEPRAZOLE DR 20 MG CAPSULE: 20 | 30 days supply | Qty: 30 | Fill #3

## 2016-01-30 ENCOUNTER — Ambulatory Visit (INDEPENDENT_AMBULATORY_CARE_PROVIDER_SITE_OTHER): Payer: 59 | Admitting: Family

## 2016-01-30 ENCOUNTER — Encounter: Payer: Self-pay | Admitting: Family

## 2016-01-30 VITALS — BP 132/85 | HR 75 | Temp 98.7°F | Resp 16 | Ht 67.0 in | Wt 277.8 lb

## 2016-01-30 DIAGNOSIS — E039 Hypothyroidism, unspecified: Secondary | ICD-10-CM | POA: Diagnosis not present

## 2016-01-30 DIAGNOSIS — I10 Essential (primary) hypertension: Secondary | ICD-10-CM

## 2016-01-30 DIAGNOSIS — E785 Hyperlipidemia, unspecified: Secondary | ICD-10-CM

## 2016-01-30 DIAGNOSIS — E119 Type 2 diabetes mellitus without complications: Secondary | ICD-10-CM | POA: Diagnosis not present

## 2016-01-30 LAB — LIPID PANEL
CHOL/HDL RATIO: 4
CHOLESTEROL: 166 mg/dL (ref 0–200)
HDL: 39.8 mg/dL (ref >39.00)
NONHDL: 126.07
TRIGLYCERIDES: 241 mg/dL — AB (ref 0.0–149.0)
VLDL: 48.2 mg/dL — AB (ref 0.0–40.0)

## 2016-01-30 LAB — BASIC METABOLIC PANEL
BUN: 13 mg/dL (ref 6–23)
CHLORIDE: 104 meq/L (ref 96–112)
CO2: 26 meq/L (ref 19–32)
CREATININE: 0.61 mg/dL (ref 0.40–1.20)
Calcium: 9.4 mg/dL (ref 8.4–10.5)
GFR: 122.3 mL/min (ref >60.00)
Glucose, Bld: 124 mg/dL — ABNORMAL HIGH (ref 70–99)
POTASSIUM: 3.6 meq/L (ref 3.5–5.1)
Sodium: 139 mEq/L (ref 135–145)

## 2016-01-30 LAB — TSH: TSH: 1.11 u[IU]/mL (ref 0.35–4.50)

## 2016-01-30 LAB — LDL CHOLESTEROL, DIRECT: Direct LDL: 104 mg/dL

## 2016-01-30 LAB — HEMOGLOBIN A1C: Hgb A1c MFr Bld: 6.7 % — ABNORMAL HIGH (ref 4.6–6.5)

## 2016-01-30 MED ORDER — BENZOYL PEROXIDE-ERYTHROMYCIN 5-3 % EX PACK: 1.0000 "application " | PACK | Freq: Two times a day (BID) | CUTANEOUS | 5 refills | Status: DC

## 2016-01-30 MED FILL — ERYTHROMYCIN-BENZOYL GEL: 5-3 | 15 days supply | Qty: 23 | Fill #0

## 2016-01-30 NOTE — Progress Notes (Signed)
Pre visit review using our clinic review tool, if applicable. No additional management support is needed unless otherwise documented below in the visit note. 

## 2016-01-30 NOTE — Patient Instructions (Signed)
Please complete lab work prior to leaving.   

## 2016-01-30 NOTE — Progress Notes (Signed)
Subjective:     Patient ID: Kristy Chung, female   DOB: 1985-06-23, 30 y.o.   MRN: [id number]  HPI  Kristy Chung is a 30 yr old female who presents today for follow up.  1) DM2- currently maintained on metformin. She has began an exerise regimen-  3-4 days a week cardio. Working with a Clinical research associate.  Lab Results  Component Value Date   HGBA1C 6.7 (H) 01/30/2016   HGBA1C 6.3 04/22/2015   HGBA1C 6.1 08/21/2014   Lab Results  Component Value Date   MICROALBUR <0.7 04/22/2015   LDLCALC 80 08/21/2014   CREATININE 0.61 01/30/2016    HTN- she is maintained on hctz, toprol-xl, aldactone.  BP Readings from Last 3 Encounters:  01/30/16 132/85  10/21/15 118/78  04/22/15 117/71      Review of Systems    see HPI  Past Medical History:  Diagnosis Date  . Allergy   . History of chicken pox      Social History   Social History  . Marital status: Single    Spouse name: N/A  . Number of children: N/A  . Years of education: N/A   Occupational History  . Not on file.   Social History Main Topics  . Smoking status: Never Smoker  . Smokeless tobacco: Never Used  . Alcohol use 0.0 oz/week     Comment: occasional  . Drug use: No  . Sexual activity: No     Comment: INTERCOURSE AGE 17, SEXUAL PARTNERS 5   Other Topics Concern  . 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 WISDOM TOOTH EXTRACTION  2005    Family History  Problem Relation Age of Onset  . Hyperlipidemia Mother   . Depression Mother   . Hyperlipidemia Father   . Hypertension Father   . Hypothyroidism Maternal Grandmother   . Heart disease Maternal Grandfather     MI, stent, CAD  . Hypertension Maternal Grandfather   . Hyperlipidemia Maternal Grandfather   . COPD Paternal Grandmother   .  Hypertension Paternal Grandmother   . Hyperlipidemia Paternal Grandmother   . Heart disease Paternal Grandmother   . AAA (abdominal aortic aneurysm) Paternal Grandfather   . Dementia Paternal Grandfather   . Heart disease Paternal Grandfather     Allergies  Allergen Reactions  . Amlodipine     flushing  . Other     CINNAMON GUM--blisters.    Current Outpatient Prescriptions on File Prior to Visit  Medication Sig Dispense Refill  . atorvastatin (LIPITOR) 20 MG tablet Take 1 tablet (20 mg total) by mouth daily. 90 tablet 1  . Blood Glucose Monitoring Suppl (TRUERESULT BLOOD GLUCOSE) W/DEVICE KIT Use as directed to check blood sugar  DX 790.29 1 each 0  . hydrochlorothiazide (HYDRODIURIL) 25 MG tablet Take 1 tablet (25 mg total) by mouth daily. 90 tablet 1  . Lancets MISC Use to check blood sugar once a day with TrueResult meter.  DX 790.29 100 each 2  . levothyroxine (SYNTHROID, LEVOTHROID) 50 MCG tablet Take 1 tablet (50 mcg total) by mouth daily before breakfast. 30 tablet 0  . medroxyPROGESTERone (PROVERA) 10 MG tablet Take 10 mg by mouth daily.    . metFORMIN (GLUCOPHAGE) 500  MG tablet TAKE 1 TABLET BY MOUTH TWICE DAILY WITH A MEAL 180 tablet 1  . metoprolol succinate (TOPROL-XL) 25 MG 24 hr tablet Take 0.5 tablets (12.5 mg total) by mouth daily. 15 tablet 0  . Multiple Vitamin (MULTIVITAMIN) tablet Take 1 tablet by mouth daily.    . Omega-3 Fatty Acids (FISH OIL) 1000 MG CAPS Take 2,000 mg by mouth 2 (two) times daily.    Marland Kitchen omeprazole (PRILOSEC) 20 MG capsule TAKE 1 CAPSULE (20 MG TOTAL) BY MOUTH DAILY. 30 capsule 5  . spironolactone (ALDACTONE) 50 MG tablet Take 1 tablet (50 mg total) by mouth daily. 90 tablet 4  . TRUETEST TEST test strip TRUERESULT TEST STRIPS USE AS INSTRUCTED ONCE A DAY TO CHECK BLOOD SUGAR 100 each 1  . vitamin B-12 (CYANOCOBALAMIN) 1000 MCG tablet Take 1,000 mcg by mouth daily.     No current facility-administered medications on file prior to visit.      BP 132/85   Pulse 75   Temp 98.7 F (37.1 C) (Oral)   Resp 16   Ht 5' 7"  (1.702 m)   Wt 277 lb 12.8 oz (126 kg)   LMP 01/02/2016   SpO2 98% Comment: room air  BMI 43.51 kg/m    Objective:   Physical Exam  Constitutional: She is oriented to person, place, and time. She appears well-developed and well-nourished.  HENT:  Head: Normocephalic and atraumatic.  Cardiovascular: Normal rate, regular rhythm and normal heart sounds.   No murmur heard. Pulmonary/Chest: Effort normal and breath sounds normal. No respiratory distress. She has no wheezes.  Musculoskeletal: She exhibits no edema.  Neurological: She is alert and oriented to person, place, and time.  Psychiatric: She has a normal mood and affect. Her behavior is normal. Judgment and thought content normal.       Assessment:         Plan:

## 2016-01-31 ENCOUNTER — Encounter: Payer: Self-pay | Admitting: Family

## 2016-02-06 NOTE — Assessment & Plan Note (Signed)
BP stable on current medications. Continue same.  

## 2016-02-06 NOTE — Assessment & Plan Note (Addendum)
Lab Results  Component Value Date   HGBA1C 6.7 (H) 01/30/2016   A1C is up slightly but she just recently started an exercise regimen, so I expect that this will come down.  Continue metformin.

## 2016-02-17 ENCOUNTER — Other Ambulatory Visit: Payer: Self-pay | Admitting: Family

## 2016-02-17 MED FILL — ATORVASTATIN 20 MG TABLET: 20 | 90 days supply | Qty: 90 | Fill #0

## 2016-02-17 MED FILL — metFORMIN HCL 500 MG TABS: 500 | 90 days supply | Qty: 180 | Fill #0

## 2016-02-17 MED FILL — OMEPRAZOLE DR 20 MG CAPSULE: 20 | 30 days supply | Qty: 30 | Fill #4

## 2016-02-17 MED FILL — HYDROCHLOROTHIAZIDE 25 MG T: 25 | 90 days supply | Qty: 90 | Fill #0

## 2016-02-17 MED FILL — LEVOTHYROXINE 50 MCG TABLET: 50 | 90 days supply | Qty: 90 | Fill #0

## 2016-02-17 MED FILL — SPIRONOLACTONE 50 MG TABLET: 50 | 90 days supply | Qty: 90 | Fill #1

## 2016-02-17 MED FILL — METOPROLOL SUCC ER 25 MG TA: 25 | 90 days supply | Qty: 45 | Fill #0

## 2016-03-09 ENCOUNTER — Other Ambulatory Visit: Payer: 59

## 2016-03-10 ENCOUNTER — Ambulatory Visit
Admission: RE | Admit: 2016-03-10 | Discharge: 2016-03-10 | Disposition: A | Discharge status: Home / Self Care | Payer: 59 | Source: Ambulatory Visit | Attending: Nurse Practitioner | Admitting: Nurse Practitioner

## 2016-03-10 ENCOUNTER — Other Ambulatory Visit: Payer: 59

## 2016-03-10 DIAGNOSIS — K7689 Other specified diseases of liver: Secondary | ICD-10-CM | POA: Diagnosis not present

## 2016-03-10 DIAGNOSIS — D134 Benign neoplasm of liver: Secondary | ICD-10-CM

## 2016-03-10 MED ORDER — GADOXETATE DISODIUM 0.25 MMOL/ML IV SOLN
10.0000 mL | Freq: Once | INTRAVENOUS | Status: AC | PRN
  Administered 2016-03-10: 10 mL via INTRAVENOUS

## 2016-03-17 ENCOUNTER — Ambulatory Visit: Payer: 59 | Admitting: Family

## 2016-03-23 DIAGNOSIS — D134 Benign neoplasm of liver: Secondary | ICD-10-CM | POA: Diagnosis not present

## 2016-03-23 DIAGNOSIS — K760 Fatty (change of) liver, not elsewhere classified: Secondary | ICD-10-CM | POA: Diagnosis not present

## 2016-04-20 MED FILL — OMEPRAZOLE DR 20 MG CAPSULE: 20 | 30 days supply | Qty: 30 | Fill #5

## 2016-06-09 ENCOUNTER — Other Ambulatory Visit: Payer: Self-pay | Admitting: Family

## 2016-06-09 MED FILL — LEVOTHYROXINE 50 MCG TABLET: 50 | 90 days supply | Qty: 90 | Fill #1

## 2016-06-09 MED FILL — SPIRONOLACTONE 50 MG TABLET: 50 | 90 days supply | Qty: 90 | Fill #2

## 2016-06-09 MED FILL — metFORMIN HCL 500 MG TABS: 500 | 90 days supply | Qty: 180 | Fill #1

## 2016-06-09 MED FILL — OMEPRAZOLE DR 20 MG CAPSULE: 20 | 90 days supply | Qty: 90 | Fill #0

## 2016-06-09 MED FILL — ATORVASTATIN 20 MG TABLET: 20 | 90 days supply | Qty: 90 | Fill #1

## 2016-06-09 MED FILL — HYDROCHLOROTHIAZIDE 25 MG T: 25 | 90 days supply | Qty: 90 | Fill #1

## 2016-06-09 MED FILL — METOPROLOL SUCC ER 25 MG TA: 25 | 90 days supply | Qty: 45 | Fill #1

## 2016-06-09 NOTE — Telephone Encounter (Signed)
Medication filled to pharmacy as requested.   

## 2016-09-14 ENCOUNTER — Encounter: Payer: Self-pay | Admitting: Family

## 2016-09-14 DIAGNOSIS — E119 Type 2 diabetes mellitus without complications: Secondary | ICD-10-CM | POA: Diagnosis not present

## 2016-09-14 DIAGNOSIS — H52223 Regular astigmatism, bilateral: Secondary | ICD-10-CM | POA: Diagnosis not present

## 2016-09-14 DIAGNOSIS — H5213 Myopia, bilateral: Secondary | ICD-10-CM | POA: Diagnosis not present

## 2016-09-14 LAB — HM DIABETES EYE EXAM

## 2016-10-14 ENCOUNTER — Other Ambulatory Visit: Payer: Self-pay | Admitting: Family

## 2016-10-14 MED FILL — HYDROCHLOROTHIAZIDE 25 MG T: 25 | 90 days supply | Qty: 90 | Fill #0

## 2016-10-14 MED FILL — METOPROLOL SUCC ER 25 MG TA: 25 | 90 days supply | Qty: 45 | Fill #0

## 2016-10-14 MED FILL — LEVOTHYROXINE 50 MCG TABLET: 50 | 90 days supply | Qty: 90 | Fill #0

## 2016-10-14 MED FILL — OMEPRAZOLE DR 20 MG CAPSULE: 20 | 90 days supply | Qty: 90 | Fill #1

## 2016-10-14 NOTE — Telephone Encounter (Signed)
Rx's approved and sent to the pharmacy by e-script.//AB/CMA 

## 2016-10-29 ENCOUNTER — Ambulatory Visit (INDEPENDENT_AMBULATORY_CARE_PROVIDER_SITE_OTHER): Payer: 59 | Admitting: Family

## 2016-10-29 ENCOUNTER — Encounter: Payer: Self-pay | Admitting: Family

## 2016-10-29 VITALS — BP 140/80 | HR 76 | Temp 98.4°F | Resp 16 | Ht 67.0 in | Wt 286.0 lb

## 2016-10-29 DIAGNOSIS — I10 Essential (primary) hypertension: Secondary | ICD-10-CM | POA: Diagnosis not present

## 2016-10-29 DIAGNOSIS — E118 Type 2 diabetes mellitus with unspecified complications: Secondary | ICD-10-CM

## 2016-10-29 DIAGNOSIS — G4700 Insomnia, unspecified: Secondary | ICD-10-CM

## 2016-10-29 DIAGNOSIS — Z23 Encounter for immunization: Secondary | ICD-10-CM | POA: Diagnosis not present

## 2016-10-29 DIAGNOSIS — Z0000 Encounter for general adult medical examination without abnormal findings: Secondary | ICD-10-CM | POA: Diagnosis not present

## 2016-10-29 LAB — BASIC METABOLIC PANEL
BUN: 7 mg/dL (ref 7–25)
CO2: 21 mmol/L (ref 20–31)
Calcium: 9.1 mg/dL (ref 8.6–10.2)
Chloride: 104 mmol/L (ref 98–110)
Creat: 0.61 mg/dL (ref 0.50–1.10)
Glucose, Bld: 122 mg/dL — ABNORMAL HIGH (ref 65–99)
Potassium: 4.1 mmol/L (ref 3.5–5.3)
Sodium: 137 mmol/L (ref 135–146)

## 2016-10-29 LAB — HEPATIC FUNCTION PANEL
ALBUMIN: 4.1 g/dL (ref 3.6–5.1)
ALT: 88 U/L — ABNORMAL HIGH (ref 6–29)
AST: 88 U/L — ABNORMAL HIGH (ref 10–30)
Alkaline Phosphatase: 97 U/L (ref 33–115)
BILIRUBIN INDIRECT: 0.3 mg/dL (ref 0.2–1.2)
BILIRUBIN TOTAL: 0.4 mg/dL (ref 0.2–1.2)
Bilirubin, Direct: 0.1 mg/dL (ref <=0.2)
TOTAL PROTEIN: 6.6 g/dL (ref 6.1–8.1)

## 2016-10-29 LAB — CBC WITH DIFFERENTIAL/PLATELET
Basophils Absolute: 0 cells/uL (ref 0–200)
Basophils Relative: 0 %
Eosinophils Absolute: 162 cells/uL (ref 15–500)
Eosinophils Relative: 2 %
HCT: 37.7 % (ref 35.0–45.0)
Hemoglobin: 12.2 g/dL (ref 11.7–15.5)
Lymphocytes Relative: 31 %
Lymphs Abs: 2511 cells/uL (ref 850–3900)
MCH: 27.3 pg (ref 27.0–33.0)
MCHC: 32.4 g/dL (ref 32.0–36.0)
MCV: 84.3 fL (ref 80.0–100.0)
MPV: 9.1 fL (ref 7.5–12.5)
Monocytes Absolute: 324 cells/uL (ref 200–950)
Monocytes Relative: 4 %
Neutro Abs: 5103 cells/uL (ref 1500–7800)
Neutrophils Relative %: 63 %
Platelets: 313 10*3/uL (ref 140–400)
RBC: 4.47 MIL/uL (ref 3.80–5.10)
RDW: 14.5 % (ref 11.0–15.0)
WBC: 8.1 10*3/uL (ref 3.8–10.8)

## 2016-10-29 LAB — LIPID PANEL
CHOL/HDL RATIO: 5.1 ratio — AB (ref <5.0)
Cholesterol: 208 mg/dL — ABNORMAL HIGH (ref <200)
HDL: 41 mg/dL — AB (ref >50)
LDL CALC: 108 mg/dL — AB (ref <100)
TRIGLYCERIDES: 297 mg/dL — AB (ref <150)
VLDL: 59 mg/dL — ABNORMAL HIGH (ref <30)

## 2016-10-29 LAB — TSH: TSH: 1.32 mIU/L

## 2016-10-29 MED ORDER — METFORMIN HCL 500 MG PO TABS: ORAL_TABLET | ORAL | 1 refills | Status: DC

## 2016-10-29 MED ORDER — METOPROLOL SUCCINATE ER 25 MG PO TB24: ORAL_TABLET | ORAL | 1 refills | Status: DC

## 2016-10-29 MED ORDER — ATORVASTATIN CALCIUM 20 MG PO TABS: 20.0000 mg | ORAL_TABLET | Freq: Every day | ORAL | 1 refills | Status: DC

## 2016-10-29 MED ORDER — TRAZODONE HCL 50 MG PO TABS: 25.0000 mg | ORAL_TABLET | Freq: Every evening | ORAL | 1 refills | Status: DC | PRN

## 2016-10-29 MED FILL — ATORVASTATIN 20 MG TABLET: 20 | 90 days supply | Qty: 90 | Fill #0

## 2016-10-29 MED FILL — traZODone HCL 50 MG TABS: 50 | 30 days supply | Qty: 30 | Fill #0

## 2016-10-29 MED FILL — metFORMIN HCL 500 MG TABS: 500 | 90 days supply | Qty: 180 | Fill #0

## 2016-10-29 NOTE — Addendum Note (Signed)
Addended by: Kelle Darting A on: 10/29/2016 05:08 PM   Modules accepted: Orders

## 2016-10-29 NOTE — Patient Instructions (Addendum)
Please work on healthy diet, exercise and weight loss. Complete lab work prior to leaving. Start trazodone 1/2 tab once nightly for sleep, may increase to a full tab nightly as needed.

## 2016-10-29 NOTE — Progress Notes (Signed)
Subjective:    Patient ID: Kristy Chung, female    DOB: [birth year], 31 y.o.   MRN: [id number]  HPI   Kristy Chung is a 31 yr old female who presents today for cpx.  Immunizations: tetanus is due Diet: poor eating habits x 3 months.  Exercise: not exercising due to poor energy Pap Smear:  2015- will schedule with GYN [facility]: up to date Dental: up to date  Insomnia- has trouble falling asleep and trouble staying asleep. Some snoring, no witnessed apneas.   HTN-  BP Readings from Last 3 Encounters:  10/29/16 140/80  01/30/16 132/85  10/21/15 118/78   Lab Results  Component Value Date   HGBA1C 6.7 (H) 01/30/2016   HGBA1C 6.3 04/22/2015   HGBA1C 6.1 08/21/2014   Lab Results  Component Value Date   MICROALBUR <0.7 04/22/2015   LDLCALC 80 08/21/2014   CREATININE 0.61 01/30/2016      Review of Systems  Constitutional: Positive for unexpected weight change.  HENT: Positive for rhinorrhea. Negative for hearing loss.   Eyes: Negative for visual disturbance.  Respiratory: Negative for cough.   Cardiovascular: Negative for leg swelling.  Gastrointestinal: Negative for blood in stool, constipation and diarrhea.  Genitourinary: Negative for dysuria, frequency, hematuria and menstrual problem.  Musculoskeletal: Negative for arthralgias and myalgias.  Skin: Negative for rash.  Neurological:       Occasional migraines- sleep and excedrine relieve  Hematological: Negative for adenopathy.  Psychiatric/Behavioral:       Denies depression/anxiety     Past Medical History:  Diagnosis Date  . Allergy   . History of chicken pox      Social History   Social History  . Marital status: Single    Spouse name: N/A  . Number of children: N/A  . Years of education: N/A   Occupational History  . Not on file.   Social History Main Topics  . Smoking status: Never Smoker  . Smokeless tobacco: Never Used  . Alcohol use 0.0 oz/week     Comment: occasional  . Drug use: No    . Sexual activity: No     Comment: INTERCOURSE AGE 22, SEXUAL PARTNERS 5   Other Topics Concern  . 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 WISDOM TOOTH EXTRACTION  2005    Family History  Problem Relation Age of Onset  . Hyperlipidemia Mother   . Depression Mother   . Hyperlipidemia Father   . Hypertension Father   . Hypothyroidism Maternal Grandmother   . Heart disease Maternal Grandfather        MI, stent, CAD  . Hypertension Maternal Grandfather   . Hyperlipidemia Maternal Grandfather   . COPD Paternal Grandmother   . Hypertension Paternal Grandmother   . Hyperlipidemia Paternal Grandmother   . Heart disease Paternal Grandmother   . AAA (abdominal aortic aneurysm) Paternal Grandfather   . Dementia Paternal Grandfather   . Heart disease Paternal Grandfather     Allergies  Allergen Reactions  . Amlodipine     flushing  . Other     CINNAMON GUM--blisters.    Current Outpatient Prescriptions on File Prior to Visit  Medication Sig Dispense Refill  . atorvastatin (LIPITOR) 20  MG tablet TAKE 1 TABLET (20 MG TOTAL) BY MOUTH DAILY. 90 tablet 1  . Benzoyl Peroxide-Erythromycin 5-3 % PACK Apply 1 application topically 2 (two) times daily. 1 each 5  . Blood Glucose Monitoring Suppl (TRUERESULT BLOOD GLUCOSE) W/DEVICE KIT Use as directed to check blood sugar  DX 790.29 1 each 0  . hydrochlorothiazide (HYDRODIURIL) 25 MG tablet TAKE 1 TABLET (25 MG TOTAL) BY MOUTH DAILY. 90 tablet 1  . Lancets MISC Use to check blood sugar once a day with TrueResult meter.  DX 790.29 100 each 2  . levothyroxine (SYNTHROID, LEVOTHROID) 50 MCG tablet TAKE 1 TABLET (50 MCG TOTAL) BY MOUTH DAILY BEFORE BREAKFAST. 90 tablet 1  . metFORMIN (GLUCOPHAGE) 500 MG tablet TAKE 1 TABLET BY MOUTH  TWICE DAILY WITH A MEAL 180 tablet 1  . metoprolol succinate (TOPROL-XL) 25 MG 24 hr tablet TAKE 1/2 TABLET (12.5 MG TOTAL) BY MOUTH DAILY. 45 tablet 1  . Multiple Vitamin (MULTIVITAMIN) tablet Take 1 tablet by mouth daily.    . Omega-3 Fatty Acids (FISH OIL) 1000 MG CAPS Take 2,000 mg by mouth 2 (two) times daily.    Marland Kitchen omeprazole (PRILOSEC) 20 MG capsule TAKE 1 CAPSULE (20 MG TOTAL) BY MOUTH DAILY. 90 capsule 1  . spironolactone (ALDACTONE) 50 MG tablet Take 1 tablet (50 mg total) by mouth daily. 90 tablet 4  . TRUETEST TEST test strip TRUERESULT TEST STRIPS USE AS INSTRUCTED ONCE A DAY TO CHECK BLOOD SUGAR 100 each 1  . vitamin B-12 (CYANOCOBALAMIN) 1000 MCG tablet Take 1,000 mcg by mouth daily.    . medroxyPROGESTERone (PROVERA) 10 MG tablet Take 10 mg by mouth daily.     No current facility-administered medications on file prior to visit.     BP 140/80 (BP Location: Left Arm, Cuff Size: Large)   Pulse 76   Temp 98.4 F (36.9 C) (Oral)   Resp 16   Ht 5' 7"  (1.702 m)   Wt 286 lb (129.7 kg)   LMP 10/24/2016   SpO2 99%   BMI 44.79 kg/m       Objective:   Physical Exam  Physical Exam  Constitutional: She is oriented to person, place, and time. She appears well-developed and well-nourished. No distress.  HENT:  Head: Normocephalic and atraumatic.  Right Ear: Tympanic membrane and ear canal normal.  Left Ear: Tympanic membrane and ear canal normal.  Mouth/Throat: Oropharynx is clear and moist.  Eyes: Pupils are equal, round, and reactive to light. No scleral icterus.  Neck: Normal range of motion. No thyromegaly present.  Cardiovascular: Normal rate and regular rhythm.   No murmur heard. Pulmonary/Chest: Effort normal and breath sounds normal. No respiratory distress. He has no wheezes. She has no rales. She exhibits no tenderness.  Abdominal: Soft. Bowel sounds are normal. She exhibits no distension and no mass. There is no tenderness. There is no rebound and no guarding.    Musculoskeletal: She exhibits no edema.  Lymphadenopathy:    She has no cervical adenopathy.  Neurological: She is alert and oriented to person, place, and time. She has normal patellar reflexes. She exhibits normal muscle tone. Coordination normal.  Skin: Skin is warm and dry.  Psychiatric: She has a normal mood and affect. Her behavior is normal. Judgment and thought content normal.        Assessment & Plan:         Assessment & Plan:  Preventative care- discussed healthy diet, exercise and weight loss. Obtain routine lab work.  Td today. Pap per gyn.  Wt Readings from Last 3 Encounters:  10/29/16 286 lb (129.7 kg)  01/30/16 277 lb 12.8 oz (126 kg)  10/21/15 278 lb (126.1 kg)   Diabetes type 2- obtain A1C, urine microalbumin. Continue metformin.  HTN- BP elevated. Advised pt to increase toprol xl to a full tablet once daily.  Insomnia- new. Trial of trazodone. If symptoms worsen or if symptoms do not improve, would consider sleep study.

## 2016-10-30 LAB — HEMOGLOBIN A1C
Hgb A1c MFr Bld: 7 % — ABNORMAL HIGH (ref <5.7)
Mean Plasma Glucose: 154 mg/dL

## 2016-10-30 LAB — URINALYSIS, ROUTINE W REFLEX MICROSCOPIC
Bilirubin Urine: NEGATIVE
Glucose, UA: NEGATIVE
Hgb urine dipstick: NEGATIVE
Ketones, ur: NEGATIVE
LEUKOCYTES UA: NEGATIVE
Nitrite: POSITIVE — AB
PH: 6 (ref 5.0–8.0)
Protein, ur: NEGATIVE
Specific Gravity, Urine: 1.015 (ref 1.001–1.035)

## 2016-10-30 LAB — URINALYSIS, MICROSCOPIC ONLY
CASTS: NONE SEEN [LPF]
Crystals: NONE SEEN [HPF]
YEAST: NONE SEEN [HPF]

## 2016-10-30 LAB — MICROALBUMIN / CREATININE URINE RATIO
Creatinine, Urine: 114 mg/dL (ref 20–320)
Microalb, Ur: <0.2 mg/dL

## 2016-11-01 ENCOUNTER — Other Ambulatory Visit: Payer: Self-pay | Admitting: Family

## 2016-11-01 NOTE — Telephone Encounter (Signed)
Liver function testing is elevated. I suspect that this is due to fatty liver (noted on most recent abdominal imaging). I will forward these results to [facility] for review.  Sugar is up slightly. (A1C is 7) Please increase metformin to 1000mg  bid.   Cholesterol is elevated.  I would recommend that she increase the lipitor dose from 20mg  to 40mg .  I know she told me she is using condoms for birth control.  Since statins are so dangerous to a developing fetus if she were to become pregnant, I would really recommend a more reliable form of birth control. Mirena IUD would be a possibility for her.  I would advise her to follow up with her GYN to discuss this.    As we discussed at her visit, weight loss will help liver function, sugar and cholesterol.  Blood count, thyroid and kidney function look good.

## 2016-11-02 MED ORDER — ATORVASTATIN CALCIUM 40 MG PO TABS: 40.0000 mg | ORAL_TABLET | Freq: Every day | ORAL | 5 refills | Status: DC

## 2016-11-02 MED ORDER — METFORMIN HCL 1000 MG PO TABS: 1000.0000 mg | ORAL_TABLET | Freq: Two times a day (BID) | ORAL | 5 refills | Status: DC

## 2016-11-02 NOTE — Telephone Encounter (Signed)
Notified pt and she voices understanding. Rxs sent. Liver results faxed to [facility].

## 2016-12-27 ENCOUNTER — Other Ambulatory Visit: Payer: Self-pay | Admitting: Women's Health

## 2016-12-27 DIAGNOSIS — L680 Hirsutism: Secondary | ICD-10-CM

## 2016-12-27 MED FILL — SPIRONOLACTONE 50 MG TAB: 50 | 90 days supply | Qty: 90 | Fill #0

## 2016-12-27 MED FILL — METOPROLOL SUCC ER 25 MG TA: 25 | 90 days supply | Qty: 45 | Fill #1

## 2016-12-27 NOTE — Telephone Encounter (Signed)
Last filled in June 2016

## 2016-12-27 NOTE — Telephone Encounter (Signed)
Okay for refill but needs annual exam to be scheduled.

## 2017-01-03 ENCOUNTER — Encounter: Payer: Self-pay | Admitting: Family

## 2017-01-26 MED FILL — ATORVASTATIN 20 MG TABLET: 20 | 90 days supply | Qty: 90 | Fill #1

## 2017-01-26 MED FILL — metFORMIN HCL 500 MG TABS: 500 | 90 days supply | Qty: 180 | Fill #1

## 2017-02-07 ENCOUNTER — Other Ambulatory Visit: Payer: Self-pay | Admitting: Family

## 2017-02-07 MED FILL — OMEPRAZOLE 20 MG CAP: 20 | 90 days supply | Qty: 90 | Fill #0

## 2017-03-01 ENCOUNTER — Other Ambulatory Visit: Payer: Self-pay | Admitting: Nurse Practitioner

## 2017-03-01 DIAGNOSIS — D134 Benign neoplasm of liver: Secondary | ICD-10-CM

## 2017-03-07 MED FILL — traZODone HCL 50 MG TABS: 50 | 30 days supply | Qty: 30 | Fill #1

## 2017-03-07 MED FILL — HYDROCHLOROTHIAZIDE 25 MG T: 25 | 90 days supply | Qty: 90 | Fill #1

## 2017-03-07 MED FILL — LEVOTHYROXINE 50 MCG TABLET: 50 | 90 days supply | Qty: 90 | Fill #1

## 2017-03-09 MED FILL — METOPROLOL SUCC ER 25 MG TA: 25 | 90 days supply | Qty: 90 | Fill #0

## 2017-03-12 ENCOUNTER — Ambulatory Visit
Admission: RE | Admit: 2017-03-12 | Discharge: 2017-03-12 | Disposition: A | Discharge status: Home / Self Care | Payer: 59 | Source: Ambulatory Visit | Attending: Nurse Practitioner | Admitting: Nurse Practitioner

## 2017-03-12 DIAGNOSIS — D134 Benign neoplasm of liver: Secondary | ICD-10-CM

## 2017-03-12 MED ORDER — GADOXETATE DISODIUM 0.25 MMOL/ML IV SOLN
10.0000 mL | Freq: Once | INTRAVENOUS | Status: AC | PRN
  Administered 2017-03-12: 10 mL via INTRAVENOUS

## 2017-03-25 DIAGNOSIS — D134 Benign neoplasm of liver: Secondary | ICD-10-CM | POA: Diagnosis not present

## 2017-03-25 DIAGNOSIS — K760 Fatty (change of) liver, not elsewhere classified: Secondary | ICD-10-CM | POA: Diagnosis not present

## 2017-04-11 ENCOUNTER — Other Ambulatory Visit: Payer: Self-pay | Admitting: Family

## 2017-04-12 MED FILL — ATORVASTATIN 40 MG TABLET: 40 | 30 days supply | Qty: 30 | Fill #0

## 2017-04-12 MED FILL — metFORMIN HCL 1000 MG TABS: 1000 | 30 days supply | Qty: 60 | Fill #0

## 2017-04-12 NOTE — Telephone Encounter (Signed)
Refill requests from pharmacy are for incorrect strengths.  1. Atorvastatin 20mg  once a day 2. Metformin 500mg  twice a day.  Doses were increased after last visit in June. Atorvastatin should be 40mg  once a day and metformin should be 1000mg  twice a day.  Spoke with Aleene Davidson at [facility] and he reports Rxs of increased doses were placed on hold and previous doses were filled in June. Advised him to dispense 30 day supply of atorvastatin 40mg  and 30 day supply of metformin 1000mg  twice a day. Spoke with pt. She states she has been taking correct doses of both meds and had been doubling up on old doses. Pt will schedule f/u with Melissa in December.

## 2017-04-12 NOTE — Telephone Encounter (Signed)
Noted  

## 2017-04-18 ENCOUNTER — Ambulatory Visit (INDEPENDENT_AMBULATORY_CARE_PROVIDER_SITE_OTHER): Payer: 59 | Admitting: Women's Health

## 2017-04-18 ENCOUNTER — Encounter: Payer: Self-pay | Admitting: Family

## 2017-04-18 ENCOUNTER — Encounter: Payer: Self-pay | Admitting: Women's Health

## 2017-04-18 ENCOUNTER — Ambulatory Visit (INDEPENDENT_AMBULATORY_CARE_PROVIDER_SITE_OTHER): Payer: 59 | Admitting: Family

## 2017-04-18 VITALS — BP 120/87 | HR 75 | Temp 98.7°F | Resp 16 | Ht 67.0 in | Wt 275.0 lb

## 2017-04-18 VITALS — BP 120/80 | Ht 67.0 in | Wt 277.0 lb

## 2017-04-18 DIAGNOSIS — E039 Hypothyroidism, unspecified: Secondary | ICD-10-CM

## 2017-04-18 DIAGNOSIS — E119 Type 2 diabetes mellitus without complications: Secondary | ICD-10-CM

## 2017-04-18 DIAGNOSIS — I10 Essential (primary) hypertension: Secondary | ICD-10-CM

## 2017-04-18 DIAGNOSIS — R87618 Other abnormal cytological findings on specimens from cervix uteri: Secondary | ICD-10-CM | POA: Diagnosis not present

## 2017-04-18 DIAGNOSIS — G4700 Insomnia, unspecified: Secondary | ICD-10-CM | POA: Diagnosis not present

## 2017-04-18 DIAGNOSIS — Z113 Encounter for screening for infections with a predominantly sexual mode of transmission: Secondary | ICD-10-CM | POA: Diagnosis not present

## 2017-04-18 DIAGNOSIS — Z01419 Encounter for gynecological examination (general) (routine) without abnormal findings: Secondary | ICD-10-CM | POA: Diagnosis not present

## 2017-04-18 LAB — HEMOGLOBIN A1C: Hgb A1c MFr Bld: 6.9 % — ABNORMAL HIGH (ref 4.6–6.5)

## 2017-04-18 MED ORDER — METOPROLOL SUCCINATE ER 25 MG PO TB24: ORAL_TABLET | ORAL | 1 refills | Status: DC

## 2017-04-18 MED ORDER — OMEPRAZOLE 20 MG PO CPDR: DELAYED_RELEASE_CAPSULE | ORAL | 1 refills | Status: DC

## 2017-04-18 MED ORDER — LEVOTHYROXINE SODIUM 50 MCG PO TABS: 50.0000 ug | ORAL_TABLET | Freq: Every day | ORAL | 1 refills | Status: DC

## 2017-04-18 MED ORDER — HYDROCHLOROTHIAZIDE 25 MG PO TABS: 25.0000 mg | ORAL_TABLET | Freq: Every day | ORAL | 1 refills | Status: DC

## 2017-04-18 MED ORDER — ATORVASTATIN CALCIUM 40 MG PO TABS: 40.0000 mg | ORAL_TABLET | Freq: Every day | ORAL | 1 refills | Status: DC

## 2017-04-18 MED ORDER — METFORMIN HCL 1000 MG PO TABS: 1000.0000 mg | ORAL_TABLET | Freq: Two times a day (BID) | ORAL | 1 refills | Status: DC

## 2017-04-18 MED ORDER — TRAZODONE HCL 50 MG PO TABS: 25.0000 mg | ORAL_TABLET | Freq: Every evening | ORAL | 1 refills | Status: DC | PRN

## 2017-04-18 MED FILL — traZODone HCL 50 MG TABS: 50 | 45 days supply | Qty: 45 | Fill #0

## 2017-04-18 NOTE — Assessment & Plan Note (Signed)
BP stable on current meds. Continue same.  

## 2017-04-18 NOTE — Progress Notes (Signed)
Subjective:    Patient ID: Kristy Chung, female    DOB: November 04, 1985, 31 y.o.   MRN: [id number]  HPI  [name] is a 31 yr old female who presents today for follow up.  1) HTN-  Maintained on hctz and (aldactone for acne per gyn) BP Readings from Last 3 Encounters:  04/18/17 120/87  10/29/16 140/80  01/30/16 132/85   2) DM2- maintained on metformin. Had gained weight but has lost 16 pounds.   Lab Results  Component Value Date   HGBA1C 7.0 (H) 10/29/2016   HGBA1C 6.7 (H) 01/30/2016   HGBA1C 6.3 04/22/2015   Lab Results  Component Value Date   MICROALBUR <0.2 10/29/2016   LDLCALC 108 (H) 10/29/2016   CREATININE 0.61 10/29/2016   3) Hypothyroid- Maintained on synthroid. Feels well on current dose.   Lab Results  Component Value Date   TSH 1.32 10/29/2016   Insomnia-  Used trazodone, helping her sleep.  Review of Systems See HPI  Past Medical History:  Diagnosis Date  . Allergy   . History of chicken pox      Social History   Socioeconomic History  . Marital status: Single    Spouse name: Not on file  . Number of children: Not on file  . Years of education: Not on file  . Highest education level: Not on file  Social Needs  . Financial resource strain: Not on file  . Food insecurity - worry: Not on file  . Food insecurity - inability: Not on file  . Transportation needs - medical: Not on file  . Transportation needs - non-medical: Not on file  Occupational History  . Not on file  Tobacco Use  . Smoking status: Never Smoker  . Smokeless tobacco: Never Used  Substance and Sexual Activity  . Alcohol use: Yes    Alcohol/week: 0.0 oz    Comment: occasional  . Drug use: No  . Sexual activity: No    Comment: INTERCOURSE AGE 26, SEXUAL PARTNERS 5  Other Topics Concern  . 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 WISDOM TOOTH EXTRACTION  2005    Family History  Problem Relation Age of Onset  . Hyperlipidemia Mother   . Depression Mother   . Hyperlipidemia Father   . Hypertension Father   . Hypothyroidism Maternal Grandmother   . Heart disease Maternal Grandfather        MI, stent, CAD  . Hypertension Maternal Grandfather   . Hyperlipidemia Maternal Grandfather   . COPD Paternal Grandmother   . Hypertension Paternal Grandmother   . Hyperlipidemia Paternal Grandmother   . Heart disease Paternal Grandmother   . AAA (abdominal aortic aneurysm) Paternal Grandfather   . Dementia Paternal Grandfather   . Heart disease Paternal Grandfather     Allergies  Allergen Reactions  . Amlodipine     flushing  . Other     CINNAMON GUM--blisters.    Current Outpatient Medications on File Prior to Visit  Medication Sig Dispense Refill  . atorvastatin (LIPITOR) 40 MG tablet Take 1 tablet (40 mg total) by mouth daily. 30 tablet 5  . Benzoyl Peroxide-Erythromycin 5-3 % PACK Apply 1 application topically 2 (two) times daily. 1  each 5  . Blood Glucose Monitoring Suppl (TRUERESULT BLOOD GLUCOSE) W/DEVICE KIT Use as directed to check blood sugar  DX 790.29 1 each 0  . hydrochlorothiazide (HYDRODIURIL) 25 MG tablet TAKE 1 TABLET (25 MG TOTAL) BY MOUTH DAILY. 90 tablet 1  . Lancets MISC Use to check blood sugar once a day with TrueResult meter.  DX 790.29 100 each 2  . levothyroxine (SYNTHROID, LEVOTHROID) 50 MCG tablet TAKE 1 TABLET (50 MCG TOTAL) BY MOUTH DAILY BEFORE BREAKFAST. 90 tablet 1  . medroxyPROGESTERone (PROVERA) 10 MG tablet Take 10 mg by mouth daily.    . metFORMIN (GLUCOPHAGE) 1000 MG tablet Take 1 tablet (1,000 mg total) by mouth 2 (two) times daily with a meal. 30 tablet 5  . metoprolol succinate (TOPROL-XL) 25 MG 24 hr tablet Take one tablet by mouth once daily 90 tablet 1  . Multiple Vitamin (MULTIVITAMIN) tablet Take 1  tablet by mouth daily.    . Omega-3 Fatty Acids (FISH OIL) 1000 MG CAPS Take 2,000 mg by mouth 2 (two) times daily.    Marland Kitchen omeprazole (PRILOSEC) 20 MG capsule TAKE 1 CAPSULE (20 MG TOTAL) BY MOUTH DAILY. 90 capsule 0  . spironolactone (ALDACTONE) 50 MG tablet TAKE 1 TABLET (50 MG TOTAL) BY MOUTH DAILY. 90 tablet 0  . traZODone (DESYREL) 50 MG tablet Take 0.5-1 tablets (25-50 mg total) by mouth at bedtime as needed for sleep. 30 tablet 1  . TRUETEST TEST test strip TRUERESULT TEST STRIPS USE AS INSTRUCTED ONCE A DAY TO CHECK BLOOD SUGAR 100 each 1  . vitamin B-12 (CYANOCOBALAMIN) 1000 MCG tablet Take 1,000 mcg by mouth daily.     No current facility-administered medications on file prior to visit.     BP 120/87 (BP Location: Left Arm, Patient Position: Sitting, Cuff Size: Large)   Pulse 75   Temp 98.7 F (37.1 C) (Oral)   Resp 16   Ht 5' 7" (1.702 m)   Wt 275 lb (124.7 kg)   LMP 03/24/2017   SpO2 100%   BMI 43.07 kg/m       Objective:   Physical Exam  Constitutional: She is oriented to person, place, and time. She appears well-developed and well-nourished.  HENT:  Head: Normocephalic and atraumatic.  Cardiovascular: Normal rate, regular rhythm and normal heart sounds.  No murmur heard. Pulmonary/Chest: Effort normal and breath sounds normal. No respiratory distress. She has no wheezes.  Neurological: She is alert and oriented to person, place, and time.  Psychiatric: She has a normal mood and affect. Her behavior is normal. Judgment and thought content normal.          Assessment & Plan:

## 2017-04-18 NOTE — Assessment & Plan Note (Signed)
Stable on trazodone, continue same.  

## 2017-04-18 NOTE — Progress Notes (Signed)
Kristy Chung 1985-12-14 [id number]    History:    Presents for annual exam.  Regular monthly cycle/condoms. History of irregular cycles in the past states for the past year cycles are predictable and monthly. PCO OS. Normal Pap history. Did have a new partner this past year condoms 100% of the time declines need for STD screen. Hypertension, diabetes, hypothyroidism and hypercholesterolemia managed by primary care. History of a liver adenoma shrank after OCs were stopped.  Past medical history, past surgical history, family history and social history were all reviewed and documented in the EPIC chart. Nurse in ICU.  ROS:  A ROS was performed and pertinent positives and negatives are included.  Exam:  Vitals:   04/18/17 1128  BP: 120/80  Weight: 277 lb (125.6 kg)  Height: 5\' 7"  (1.702 m)   Body mass index is 43.38 kg/m.   General appearance:  Normal Thyroid:  Symmetrical, normal in size, without palpable masses or nodularity. Respiratory  Auscultation:  Clear without wheezing or rhonchi Cardiovascular  Auscultation:  Regular rate, without rubs, murmurs or gallops  Edema/varicosities:  Not grossly evident Abdominal  Soft,nontender, without masses, guarding or rebound.  Liver/spleen:  No organomegaly noted  Hernia:  None appreciated  Skin  Inspection:  Grossly normal   Breasts: Examined lying and sitting.     Right: Without masses, retractions, discharge or axillary adenopathy.     Left: Without masses, retractions, discharge or axillary adenopathy. Gentitourinary   Inguinal/mons:  Normal without inguinal adenopathy  External genitalia:  Normal  BUS/Urethra/Skene's glands:  Normal  Vagina:  Normal  Cervix:  Normal  Uterus: normal in size, shape and contour.  Midline and mobile  Adnexa/parametria:     Rt: Without masses or tenderness.   Lt: Without masses or tenderness.  Anus and perineum: Normal  Digital rectal exam: Normal sphincter tone without palpated masses or  tenderness  Assessment/Plan:  31 y.o. S WF G0 for annual exam with no complaints.  Regular monthly cycle/condoms PCO S Morbid obesity Hypertension/hyperthyroidism/diabetes/hypercholesterolemia-primary care manages labs and meds  Plan: Contraception reviewed and declined will use condoms. GC/Chlamydia, declined HIV, hepatitis or RPR. SBE's, exercise, calcium rich diet, MVI daily encouraged.. Importance of continuing regular exercise, decreasing calories/carbs for weight loss.  Pap with HR HPV typing, new screening guidelines reviewed.  Huel Cote [facility], 12:13 PM 04/18/2017

## 2017-04-18 NOTE — Patient Instructions (Signed)
Carbohydrate Counting for Diabetes Mellitus, Adult Carbohydrate counting is a method for keeping track of how many carbohydrates you eat. Eating carbohydrates naturally increases the amount of sugar (glucose) in the blood. Counting how many carbohydrates you eat helps keep your blood glucose within normal limits, which helps you manage your diabetes (diabetes mellitus). It is important to know how many carbohydrates you can safely have in each meal. This is different for every person. A diet and nutrition specialist (registered dietitian) can help you make a meal plan and calculate how many carbohydrates you should have at each meal and snack. Carbohydrates are found in the following foods:  Grains, such as breads and cereals.  Dried beans and soy products.  Starchy vegetables, such as potatoes, peas, and corn.  Fruit and fruit juices.  Milk and yogurt.  Sweets and snack foods, such as cake, cookies, candy, chips, and soft drinks.  How do I count carbohydrates? There are two ways to count carbohydrates in food. You can use either of the methods or a combination of both. Reading "Nutrition Facts" on packaged food The "Nutrition Facts" list is included on the labels of almost all packaged foods and beverages in the [place] It includes:  The serving size.  Information about nutrients in each serving, including the grams (g) of carbohydrate per serving.  To use the "Nutrition Facts":  Decide how many servings you will have.  Multiply the number of servings by the number of carbohydrates per serving.  The resulting number is the total amount of carbohydrates that you will be having.  Learning standard serving sizes of other foods When you eat foods containing carbohydrates that are not packaged or do not include "Nutrition Facts" on the label, you need to measure the servings in order to count the amount of carbohydrates:  Measure the foods that you will eat with a food scale or  measuring cup, if needed.  Decide how many standard-size servings you will eat.  Multiply the number of servings by 15. Most carbohydrate-rich foods have about 15 g of carbohydrates per serving. ? For example, if you eat 8 oz (170 g) of strawberries, you will have eaten 2 servings and 30 g of carbohydrates (2 servings x 15 g = 30 g).  For foods that have more than one food mixed, such as soups and casseroles, you must count the carbohydrates in each food that is included.  The following list contains standard serving sizes of common carbohydrate-rich foods. Each of these servings has about 15 g of carbohydrates:   hamburger bun or  English muffin.   oz (15 mL) syrup.   oz (14 g) jelly.  1 slice of bread.  1 six-inch tortilla.  3 oz (85 g) cooked rice or pasta.  4 oz (113 g) cooked dried beans.  4 oz (113 g) starchy vegetable, such as peas, corn, or potatoes.  4 oz (113 g) hot cereal.  4 oz (113 g) mashed potatoes or  of a large baked potato.  4 oz (113 g) canned or frozen fruit.  4 oz (120 mL) fruit juice.  4-6 crackers.  6 chicken nuggets.  6 oz (170 g) unsweetened dry cereal.  6 oz (170 g) plain fat-free yogurt or yogurt sweetened with artificial sweeteners.  8 oz (240 mL) milk.  8 oz (170 g) fresh fruit or one small piece of fruit.  24 oz (680 g) popped popcorn.  Example of carbohydrate counting Sample meal  3 oz (85 g) chicken breast.    6 oz (170 g) brown rice.  4 oz (113 g) corn.  8 oz (240 mL) milk.  8 oz (170 g) strawberries with sugar-free whipped topping. Carbohydrate calculation 1. Identify the foods that contain carbohydrates: ? Rice. ? Corn. ? Milk. ? Strawberries. 2. Calculate how many servings you have of each food: ? 2 servings rice. ? 1 serving corn. ? 1 serving milk. ? 1 serving strawberries. 3. Multiply each number of servings by 15 g: ? 2 servings rice x 15 g = 30 g. ? 1 serving corn x 15 g = 15 g. ? 1 serving milk x 15  g = 15 g. ? 1 serving strawberries x 15 g = 15 g. 4. Add together all of the amounts to find the total grams of carbohydrates eaten: ? 30 g + 15 g + 15 g + 15 g = 75 g of carbohydrates total. This information is not intended to replace advice given to you by your health care provider. Make sure you discuss any questions you have with your health care provider. Document Released: 05/03/2005 Document Revised: 11/21/2015 Document Reviewed: 10/15/2015 Elsevier Interactive Patient Education  2018 [place] Maintenance, Female Adopting a healthy lifestyle and getting preventive care can go a long way to promote health and wellness. Talk with your health care provider about what schedule of regular examinations is right for you. This is a good chance for you to check in with your provider about disease prevention and staying healthy. In between checkups, there are plenty of things you can do on your own. Experts have done a lot of research about which lifestyle changes and preventive measures are most likely to keep you healthy. Ask your health care provider for more information. Weight and diet Eat a healthy diet  Be sure to include plenty of vegetables, fruits, low-fat dairy products, and lean protein.  Do not eat a lot of foods high in solid fats, added sugars, or salt.  Get regular exercise. This is one of the most important things you can do for your health. ? Most adults should exercise for at least 150 minutes each week. The exercise should increase your heart rate and make you sweat (moderate-intensity exercise). ? Most adults should also do strengthening exercises at least twice a week. This is in addition to the moderate-intensity exercise.  Maintain a healthy weight  Body mass index (BMI) is a measurement that can be used to identify possible weight problems. It estimates body fat based on height and weight. Your health care provider can help determine your BMI and help you  achieve or maintain a healthy weight.  For females 31 years of age and older: ? A BMI below 18.5 is considered underweight. ? A BMI of 18.5 to 24.9 is normal. ? A BMI of 25 to 29.9 is considered overweight. ? A BMI of 30 and above is considered obese.  Watch levels of cholesterol and blood lipids  You should start having your blood tested for lipids and cholesterol at 31 years of age, then have this test every 5 years.  You may need to have your cholesterol levels checked more often if: ? Your lipid or cholesterol levels are high. ? You are older than 30 years of age. ? You are at high risk for heart disease.  Cancer screening Lung Cancer  Lung cancer screening is recommended for adults 52-25 years old who are at high risk for lung cancer because of a history of smoking.  A  yearly low-dose CT scan of the lungs is recommended for people who: ? Currently smoke. ? Have quit within the past 15 years. ? Have at least a 30-pack-year history of smoking. A pack year is smoking an average of one pack of cigarettes a day for 1 year.  Yearly screening should continue until it has been 15 years since you quit.  Yearly screening should stop if you develop a health problem that would prevent you from having lung cancer treatment.  Breast Cancer  Practice breast self-awareness. This means understanding how your breasts normally appear and feel.  It also means doing regular breast self-exams. Let your health care provider know about any changes, no matter how small.  If you are in your 20s or 30s, you should have a clinical breast exam (CBE) by a health care provider every 1-3 years as part of a regular health exam.  If you are 4 or older, have a CBE every year. Also consider having a breast X-ray (mammogram) every year.  If you have a family history of breast cancer, talk to your health care provider about genetic screening.  If you are at high risk for breast cancer, talk to your health  care provider about having an MRI and a mammogram every year.  Breast cancer gene (BRCA) assessment is recommended for women who have family members with BRCA-related cancers. BRCA-related cancers include: ? Breast. ? Ovarian. ? Tubal. ? Peritoneal cancers.  Results of the assessment will determine the need for genetic counseling and BRCA1 and BRCA2 testing.  Cervical Cancer Your health care provider may recommend that you be screened regularly for cancer of the pelvic organs (ovaries, uterus, and vagina). This screening involves a pelvic examination, including checking for microscopic changes to the surface of your cervix (Pap test). You may be encouraged to have this screening done every 3 years, beginning at age 12.  For women ages 13-65, health care providers may recommend pelvic exams and Pap testing every 3 years, or they may recommend the Pap and pelvic exam, combined with testing for human papilloma virus (HPV), every 5 years. Some types of HPV increase your risk of cervical cancer. Testing for HPV may also be done on women of any age with unclear Pap test results.  Other health care providers may not recommend any screening for nonpregnant women who are considered low risk for pelvic cancer and who do not have symptoms. Ask your health care provider if a screening pelvic exam is right for you.  If you have had past treatment for cervical cancer or a condition that could lead to cancer, you need Pap tests and screening for cancer for at least 20 years after your treatment. If Pap tests have been discontinued, your risk factors (such as having a new sexual partner) need to be reassessed to determine if screening should resume. Some women have medical problems that increase the chance of getting cervical cancer. In these cases, your health care provider may recommend more frequent screening and Pap tests.  Colorectal Cancer  This type of cancer can be detected and often  prevented.  Routine colorectal cancer screening usually begins at 31 years of age and continues through 31 years of age.  Your health care provider may recommend screening at an earlier age if you have risk factors for colon cancer.  Your health care provider may also recommend using home test kits to check for hidden blood in the stool.  A small camera at the end of  a tube can be used to examine your colon directly (sigmoidoscopy or colonoscopy). This is done to check for the earliest forms of colorectal cancer.  Routine screening usually begins at age 45.  Direct examination of the colon should be repeated every 5-10 years through 31 years of age. However, you may need to be screened more often if early forms of precancerous polyps or small growths are found.  Skin Cancer  Check your skin from head to toe regularly.  Tell your health care provider about any new moles or changes in moles, especially if there is a change in a mole's shape or color.  Also tell your health care provider if you have a mole that is larger than the size of a pencil eraser.  Always use sunscreen. Apply sunscreen liberally and repeatedly throughout the day.  Protect yourself by wearing long sleeves, pants, a wide-brimmed hat, and sunglasses whenever you are outside.  Heart disease, diabetes, and high blood pressure  High blood pressure causes heart disease and increases the risk of stroke. High blood pressure is more likely to develop in: ? People who have blood pressure in the high end of the normal range (130-139/85-89 mm Hg). ? People who are overweight or obese. ? People who are African American.  If you are 65-44 years of age, have your blood pressure checked every 3-5 years. If you are 35 years of age or older, have your blood pressure checked every year. You should have your blood pressure measured twice-once when you are at a hospital or clinic, and once when you are not at a hospital or clinic.  Record the average of the two measurements. To check your blood pressure when you are not at a hospital or clinic, you can use: ? An automated blood pressure machine at a pharmacy. ? A home blood pressure monitor.  If you are between 55 years and 4 years old, ask your health care provider if you should take aspirin to prevent strokes.  Have regular diabetes screenings. This involves taking a blood sample to check your fasting blood sugar level. ? If you are at a normal weight and have a low risk for diabetes, have this test once every three years after 31 years of age. ? If you are overweight and have a high risk for diabetes, consider being tested at a younger age or more often. Preventing infection Hepatitis B  If you have a higher risk for hepatitis B, you should be screened for this virus. You are considered at high risk for hepatitis B if: ? You were born in a country where hepatitis B is common. Ask your health care provider which countries are considered high risk. ? Your parents were born in a high-risk country, and you have not been immunized against hepatitis B (hepatitis B vaccine). ? You have HIV or AIDS. ? You use needles to inject street drugs. ? You live with someone who has hepatitis B. ? You have had sex with someone who has hepatitis B. ? You get hemodialysis treatment. ? You take certain medicines for conditions, including cancer, organ transplantation, and autoimmune conditions.  Hepatitis C  Blood testing is recommended for: ? Everyone born from [birth year] through 1965. ? Anyone with known risk factors for hepatitis C.  Sexually transmitted infections (STIs)  You should be screened for sexually transmitted infections (STIs) including gonorrhea and chlamydia if: ? You are sexually active and are younger than 31 years of age. ? You are older than  31 years of age and your health care provider tells you that you are at risk for this type of infection. ? Your sexual  activity has changed since you were last screened and you are at an increased risk for chlamydia or gonorrhea. Ask your health care provider if you are at risk.  If you do not have HIV, but are at risk, it may be recommended that you take a prescription medicine daily to prevent HIV infection. This is called pre-exposure prophylaxis (PrEP). You are considered at risk if: ? You are sexually active and do not regularly use condoms or know the HIV status of your partner(s). ? You take drugs by injection. ? You are sexually active with a partner who has HIV.  Talk with your health care provider about whether you are at high risk of being infected with HIV. If you choose to begin PrEP, you should first be tested for HIV. You should then be tested every 3 months for as long as you are taking PrEP. Pregnancy  If you are premenopausal and you may become pregnant, ask your health care provider about preconception counseling.  If you may become pregnant, take 400 to 800 micrograms (mcg) of folic acid every day.  If you want to prevent pregnancy, talk to your health care provider about birth control (contraception). Osteoporosis and menopause  Osteoporosis is a disease in which the bones lose minerals and strength with aging. This can result in serious bone fractures. Your risk for osteoporosis can be identified using a bone density scan.  If you are 29 years of age or older, or if you are at risk for osteoporosis and fractures, ask your health care provider if you should be screened.  Ask your health care provider whether you should take a calcium or vitamin D supplement to lower your risk for osteoporosis.  Menopause may have certain physical symptoms and risks.  Hormone replacement therapy may reduce some of these symptoms and risks. Talk to your health care provider about whether hormone replacement therapy is right for you. Follow these instructions at home:  Schedule regular health, dental,  and eye exams.  Stay current with your immunizations.  Do not use any tobacco products including cigarettes, chewing tobacco, or electronic cigarettes.  If you are pregnant, do not drink alcohol.  If you are breastfeeding, limit how much and how often you drink alcohol.  Limit alcohol intake to no more than 1 drink per day for nonpregnant women. One drink equals 12 ounces of beer, 5 ounces of wine, or 1 ounces of hard liquor.  Do not use street drugs.  Do not share needles.  Ask your health care provider for help if you need support or information about quitting drugs.  Tell your health care provider if you often feel depressed.  Tell your health care provider if you have ever been abused or do not feel safe at home. This information is not intended to replace advice given to you by your health care provider. Make sure you discuss any questions you have with your health care provider. Document Released: 11/16/2010 Document Revised: 10/09/2015 Document Reviewed: 02/04/2015 Elsevier Interactive Patient Education  [employer].

## 2017-04-18 NOTE — Addendum Note (Signed)
Addended by: Lorine Bears on: 04/18/2017 12:29 PM   Modules accepted: Orders

## 2017-04-18 NOTE — Assessment & Plan Note (Signed)
Obtain A1C, continue metformin, encouraged continued weight loss.

## 2017-04-18 NOTE — Patient Instructions (Addendum)
Please complete lab work prior to leaving.   

## 2017-04-18 NOTE — Assessment & Plan Note (Signed)
Clinically stable on synthroid, continue same, obtain follow up TSH.

## 2017-04-19 LAB — COMPREHENSIVE METABOLIC PANEL
ALBUMIN: 4.1 g/dL (ref 3.5–5.2)
ALK PHOS: 85 U/L (ref 39–117)
ALT: 84 U/L — AB (ref 0–35)
AST: 44 U/L — ABNORMAL HIGH (ref 0–37)
BILIRUBIN TOTAL: 0.4 mg/dL (ref 0.2–1.2)
BUN: 11 mg/dL (ref 6–23)
CALCIUM: 9.5 mg/dL (ref 8.4–10.5)
CO2: 23 meq/L (ref 19–32)
CREATININE: 0.58 mg/dL (ref 0.40–1.20)
Chloride: 101 mEq/L (ref 96–112)
GFR: 128.59 mL/min (ref >60.00)
Glucose, Bld: 119 mg/dL — ABNORMAL HIGH (ref 70–99)
Potassium: 3.9 mEq/L (ref 3.5–5.1)
SODIUM: 137 meq/L (ref 135–145)
TOTAL PROTEIN: 7 g/dL (ref 6.0–8.3)

## 2017-04-19 LAB — PAP IG W/ RFLX HPV ASCU

## 2017-04-19 LAB — C. TRACHOMATIS/N. GONORRHOEAE RNA
C. trachomatis RNA, TMA: NOT DETECTED
N. gonorrhoeae RNA, TMA: NOT DETECTED

## 2017-05-06 MED FILL — ATORVASTATIN 40 MG TABLET: 40 | 90 days supply | Qty: 90 | Fill #0

## 2017-05-09 ENCOUNTER — Other Ambulatory Visit: Payer: Self-pay | Admitting: Women's Health

## 2017-05-09 DIAGNOSIS — L680 Hirsutism: Secondary | ICD-10-CM

## 2017-05-09 MED FILL — metFORMIN HCL 1000 MG TABS: 1000 | 30 days supply | Qty: 60 | Fill #1

## 2017-05-11 MED FILL — SPIRONOLACTONE 50 MG TABS: 50 | 90 days supply | Qty: 90 | Fill #0

## 2017-06-28 MED FILL — METOPROLOL SUCCINATE ER 25: 25 | 90 days supply | Qty: 90 | Fill #1

## 2017-06-28 MED FILL — LEVOTHYROXINE 50 MCG TABLET: 50 | 90 days supply | Qty: 90 | Fill #0

## 2017-06-28 MED FILL — OMEPRAZOLE 20 MG CAP: 20 | 90 days supply | Qty: 90 | Fill #0

## 2017-06-28 MED FILL — metFORMIN HCL 1000 MG TABS: 1000 | 30 days supply | Qty: 60 | Fill #2

## 2017-06-28 MED FILL — HYDROCHLOROTHIAZIDE 25 MG T: 25 | 90 days supply | Qty: 90 | Fill #0

## 2017-08-23 MED FILL — metFORMIN HCL 1000 MG TABS: 1000 | 45 days supply | Qty: 90 | Fill #0

## 2017-09-16 ENCOUNTER — Ambulatory Visit: Payer: 59 | Admitting: Family

## 2017-09-16 ENCOUNTER — Encounter: Payer: Self-pay | Admitting: Family

## 2017-09-16 VITALS — BP 120/64 | HR 93 | Temp 98.5°F | Resp 18 | Ht 67.0 in | Wt 270.4 lb

## 2017-09-16 DIAGNOSIS — K219 Gastro-esophageal reflux disease without esophagitis: Secondary | ICD-10-CM | POA: Diagnosis not present

## 2017-09-16 DIAGNOSIS — I10 Essential (primary) hypertension: Secondary | ICD-10-CM | POA: Diagnosis not present

## 2017-09-16 DIAGNOSIS — E039 Hypothyroidism, unspecified: Secondary | ICD-10-CM | POA: Diagnosis not present

## 2017-09-16 DIAGNOSIS — E119 Type 2 diabetes mellitus without complications: Secondary | ICD-10-CM | POA: Diagnosis not present

## 2017-09-16 DIAGNOSIS — E785 Hyperlipidemia, unspecified: Secondary | ICD-10-CM

## 2017-09-16 LAB — TSH: TSH: 2.15 mIU/L

## 2017-09-16 MED ORDER — METOPROLOL SUCCINATE ER 25 MG PO TB24: ORAL_TABLET | ORAL | 1 refills | Status: DC

## 2017-09-16 MED ORDER — TRAZODONE HCL 50 MG PO TABS: 25.0000 mg | ORAL_TABLET | Freq: Every evening | ORAL | 1 refills | Status: DC | PRN

## 2017-09-16 MED ORDER — HYDROCHLOROTHIAZIDE 25 MG PO TABS: 25.0000 mg | ORAL_TABLET | Freq: Every day | ORAL | 1 refills | Status: DC

## 2017-09-16 MED ORDER — METFORMIN HCL 1000 MG PO TABS: 1000.0000 mg | ORAL_TABLET | Freq: Two times a day (BID) | ORAL | 1 refills | Status: DC

## 2017-09-16 MED ORDER — TRUETEST TEST VI STRP: ORAL_STRIP | 1 refills | Status: DC

## 2017-09-16 MED ORDER — ATORVASTATIN CALCIUM 40 MG PO TABS: 40.0000 mg | ORAL_TABLET | Freq: Every day | ORAL | 1 refills | Status: DC

## 2017-09-16 MED ORDER — OMEPRAZOLE 20 MG PO CPDR: DELAYED_RELEASE_CAPSULE | ORAL | 1 refills | Status: DC

## 2017-09-16 MED ORDER — LEVOTHYROXINE SODIUM 50 MCG PO TABS: 50.0000 ug | ORAL_TABLET | Freq: Every day | ORAL | 1 refills | Status: DC

## 2017-09-16 MED FILL — OMEPRAZOLE 20 MG CAP: 20 | 90 days supply | Qty: 90 | Fill #0

## 2017-09-16 MED FILL — ATORVASTATIN 40 MG TABLET: 40 | 90 days supply | Qty: 90 | Fill #0

## 2017-09-16 MED FILL — METOPROLOL SUCCINATE ER 25: 25 | 90 days supply | Qty: 90 | Fill #0

## 2017-09-16 MED FILL — traZODone HCL 50 MG TABS: 50 | 45 days supply | Qty: 45 | Fill #0

## 2017-09-16 MED FILL — HYDROCHLOROTHIAZIDE 25 MG T: 25 | 90 days supply | Qty: 90 | Fill #0

## 2017-09-16 MED FILL — LEVOTHYROXINE 50 MCG TABLET: 50 | 90 days supply | Qty: 90 | Fill #0

## 2017-09-16 NOTE — Progress Notes (Signed)
Subjective:    Patient ID: Kristy Chung, female    DOB: March 17, 1986, 32 y.o.   MRN: [id number]  HPI  Patient is a 32 yr old female who presents today for follow up.  HTN-  Patient is maintained on toprol xl 69m, hctz 277m aldactone.  Denies swelling.  BP Readings from Last 3 Encounters:  09/16/17 120/64  04/18/17 120/80  04/18/17 120/87   DM2- maintained on metformin. Not checking sugars regularly.   Lab Results  Component Value Date   HGBA1C 6.9 (H) 04/18/2017   HGBA1C 7.0 (H) 10/29/2016   HGBA1C 6.7 (H) 01/30/2016   Lab Results  Component Value Date   MICROALBUR <0.2 10/29/2016   LDLCALC 108 (H) 10/29/2016   CREATININE 0.58 04/18/2017   GERD- maintained on omeprazole. Continues daily and reports that her symptoms are stable. Able to tell if she doe snot take.  Insomnia- uses trazodone on the nights when she works.    Review of Systems    see HPI  Past Medical History:  Diagnosis Date  . Allergy   . History of chicken pox      Social History   Socioeconomic History  . Marital status: Single    Spouse name: Not on file  . Number of children: Not on file  . Years of education: Not on file  . Highest education level: Not on file  Occupational History  . Not on file  Social Needs  . Financial resource strain: Not on file  . Food insecurity:    Worry: Not on file    Inability: Not on file  . Transportation needs:    Medical: Not on file    Non-medical: Not on file  Tobacco Use  . Smoking status: Never Smoker  . Smokeless tobacco: Never Used  Substance and Sexual Activity  . Alcohol use: Yes    Alcohol/week: 0.0 oz    Comment: occasional  . Drug use: No  . Sexual activity: Never    Comment: INTERCOURSE AGE 72, SEXUAL PARTNERS 5  Lifestyle  . Physical activity:    Days per week: Not on file    Minutes per session: Not on file  . Stress: Not on file  Relationships  . Social connections:    Talks on phone: Not on file    Gets together:  Not on file    Attends religious service: Not on file    Active member of club or organization: Not on file    Attends meetings of clubs or organizations: Not on file    Relationship status: Not on file  . Intimate partner violence:    Fear of current or ex partner: Not on file    Emotionally abused: Not on file    Physically abused: Not on file    Forced sexual activity: Not on file  Other Topics Concern  . 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 WISDOM TOOTH EXTRACTION  2005    Family History  Problem Relation Age of Onset  . Hyperlipidemia Mother   . Depression Mother   . Hyperlipidemia Father   . Hypertension Father   . Hypothyroidism Maternal Grandmother   . Heart disease Maternal Grandfather  MI, stent, CAD  . Hypertension Maternal Grandfather   . Hyperlipidemia Maternal Grandfather   . COPD Paternal Grandmother   . Hypertension Paternal Grandmother   . Hyperlipidemia Paternal Grandmother   . Heart disease Paternal Grandmother   . AAA (abdominal aortic aneurysm) Paternal Grandfather   . Dementia Paternal Grandfather   . Heart disease Paternal Grandfather     Allergies  Allergen Reactions  . Amlodipine     flushing  . Other     CINNAMON GUM--blisters.    Current Outpatient Medications on File Prior to Visit  Medication Sig Dispense Refill  . atorvastatin (LIPITOR) 40 MG tablet Take 1 tablet (40 mg total) by mouth daily. 90 tablet 1  . Benzoyl Peroxide-Erythromycin 5-3 % PACK Apply 1 application topically 2 (two) times daily. 1 each 5  . Blood Glucose Monitoring Suppl (TRUERESULT BLOOD GLUCOSE) W/DEVICE KIT Use as directed to check blood sugar  DX 790.29 1 each 0  . hydrochlorothiazide (HYDRODIURIL) 25 MG tablet Take 1 tablet (25 mg total) by  mouth daily. 90 tablet 1  . Lancets MISC Use to check blood sugar once a day with TrueResult meter.  DX 790.29 100 each 2  . levothyroxine (SYNTHROID, LEVOTHROID) 50 MCG tablet Take 1 tablet (50 mcg total) by mouth daily before breakfast. 90 tablet 1  . metFORMIN (GLUCOPHAGE) 1000 MG tablet Take 1 tablet (1,000 mg total) by mouth 2 (two) times daily with a meal. 90 tablet 1  . metoprolol succinate (TOPROL-XL) 25 MG 24 hr tablet Take one tablet by mouth once daily 90 tablet 1  . Multiple Vitamin (MULTIVITAMIN) tablet Take 1 tablet by mouth daily.    . Omega-3 Fatty Acids (FISH OIL) 1000 MG CAPS Take 2,000 mg by mouth 2 (two) times daily.    Marland Kitchen omeprazole (PRILOSEC) 20 MG capsule TAKE 1 CAPSULE (20 MG TOTAL) BY MOUTH DAILY. 90 capsule 1  . spironolactone (ALDACTONE) 50 MG tablet TAKE 1 TABLET (50 MG TOTAL) BY MOUTH DAILY. 90 tablet 3  . traZODone (DESYREL) 50 MG tablet Take 0.5-1 tablets (25-50 mg total) by mouth at bedtime as needed for sleep. 45 tablet 1  . TRUETEST TEST test strip TRUERESULT TEST STRIPS USE AS INSTRUCTED ONCE A DAY TO CHECK BLOOD SUGAR 100 each 1  . vitamin B-12 (CYANOCOBALAMIN) 1000 MCG tablet Take 1,000 mcg by mouth daily.     No current facility-administered medications on file prior to visit.     BP 120/64 (BP Location: Right Arm, Cuff Size: Large)   Pulse 93   Temp 98.5 F (36.9 C) (Oral)   Resp 18   Ht _0  (1.702 m)   Wt 270 lb 6.4 oz (122.7 kg)   LMP 09/15/2017   SpO2 97%   BMI 42.35 kg/m    Objective:   Physical Exam  Constitutional: She appears well-developed and well-nourished.  Cardiovascular: Normal rate, regular rhythm and normal heart sounds.  No murmur heard. Pulmonary/Chest: Effort normal and breath sounds normal. No respiratory distress. She has no wheezes.  Musculoskeletal: She exhibits no edema.  Psychiatric: She has a normal mood and affect. Her behavior is normal. Judgment and thought content normal.          Assessment & Plan:  HTN-  bp stable, continue current meds.  DM2- obtain follow up A1C, continue metformin, obtain urine microalbumin.  Hyperlipidemia- obtain follow up lipid panel.  GERD- stable on PPI, continue same.  Hypothyroid- clinically stable, obtain follow up tsh.

## 2017-09-16 NOTE — Patient Instructions (Signed)
Please complete lab work prior to leaving.   

## 2017-09-16 NOTE — Addendum Note (Signed)
Addended by: Harl Bowie on: 09/16/2017 03:40 PM   Modules accepted: Orders

## 2017-09-17 LAB — LIPID PANEL
CHOL/HDL RATIO: 4.9 (calc) (ref <5.0)
Cholesterol: 182 mg/dL (ref <200)
HDL: 37 mg/dL — AB (ref >50)
LDL Cholesterol (Calc): 104 mg/dL (calc) — ABNORMAL HIGH
NON-HDL CHOLESTEROL (CALC): 145 mg/dL — AB (ref <130)
TRIGLYCERIDES: 284 mg/dL — AB (ref <150)

## 2017-09-17 LAB — MICROALBUMIN / CREATININE URINE RATIO
Creatinine, Urine: 193 mg/dL (ref 20–275)
Microalb Creat Ratio: 5 mcg/mg creat (ref <30)
Microalb, Ur: 0.9 mg/dL

## 2017-09-17 LAB — BASIC METABOLIC PANEL
BUN: 12 mg/dL (ref 7–25)
CALCIUM: 9.8 mg/dL (ref 8.6–10.2)
CO2: 24 mmol/L (ref 20–32)
Chloride: 105 mmol/L (ref 98–110)
Creat: 0.59 mg/dL (ref 0.50–1.10)
Glucose, Bld: 144 mg/dL — ABNORMAL HIGH (ref 65–99)
POTASSIUM: 3.8 mmol/L (ref 3.5–5.3)
Sodium: 140 mmol/L (ref 135–146)

## 2017-09-17 LAB — HEPATIC FUNCTION PANEL
AG Ratio: 1.8 (calc) (ref 1.0–2.5)
ALT: 47 U/L — ABNORMAL HIGH (ref 6–29)
AST: 34 U/L — AB (ref 10–30)
Albumin: 4.3 g/dL (ref 3.6–5.1)
Alkaline phosphatase (APISO): 109 U/L (ref 33–115)
BILIRUBIN INDIRECT: 0.3 mg/dL (ref 0.2–1.2)
Bilirubin, Direct: 0.1 mg/dL (ref 0.0–0.2)
GLOBULIN: 2.4 g/dL (ref 1.9–3.7)
Total Bilirubin: 0.4 mg/dL (ref 0.2–1.2)
Total Protein: 6.7 g/dL (ref 6.1–8.1)

## 2017-09-17 LAB — HEMOGLOBIN A1C
HEMOGLOBIN A1C: 6.9 %{Hb} — AB (ref <5.7)
Mean Plasma Glucose: 151 (calc)
eAG (mmol/L): 8.4 (calc)

## 2017-09-19 MED FILL — SPIRONOLACTONE 50 MG TAB: 50 | 90 days supply | Qty: 90 | Fill #1

## 2017-09-20 ENCOUNTER — Encounter: Payer: Self-pay | Admitting: Family

## 2017-09-21 ENCOUNTER — Telehealth: Payer: Self-pay | Admitting: *Deleted

## 2017-09-21 MED ORDER — FREESTYLE SYSTEM KIT
1.0000 | PACK | 0 refills | Status: DC | PRN
Start: 2017-09-21 — End: 2023-09-16

## 2017-09-21 MED ORDER — FREESTYLE LANCETS MISC: 12 refills | Status: DC

## 2017-09-21 MED ORDER — GLUCOSE BLOOD VI STRP: ORAL_STRIP | 12 refills | Status: AC

## 2017-09-21 NOTE — Telephone Encounter (Signed)
Received below message from [place], just got new scripts for Kristy Chung dob June 07, 1985 testing supplies we need to know how often she's testing please for insurance reasons  Advised that pt may check glucose 1 to 2 times daily.

## 2017-09-29 MED FILL — FREESTYLE LITE TEST STRIP: 50 days supply | Qty: 100 | Fill #0

## 2017-09-29 MED FILL — FREESTYLE LITE METER: 30 days supply | Qty: 1 | Fill #0

## 2017-09-29 MED FILL — FREESTYLE LANCETS: 50 days supply | Qty: 100 | Fill #0

## 2017-10-05 ENCOUNTER — Encounter: Payer: 59 | Admitting: Family

## 2017-10-06 DIAGNOSIS — H52223 Regular astigmatism, bilateral: Secondary | ICD-10-CM | POA: Diagnosis not present

## 2017-10-06 DIAGNOSIS — H5213 Myopia, bilateral: Secondary | ICD-10-CM | POA: Diagnosis not present

## 2017-10-06 LAB — HM DIABETES EYE EXAM

## 2017-10-17 ENCOUNTER — Encounter: Payer: Self-pay | Admitting: Family

## 2017-10-19 MED FILL — metFORMIN HCL 1000 MG TABS: 1000 | 45 days supply | Qty: 90 | Fill #1

## 2017-10-21 ENCOUNTER — Ambulatory Visit (INDEPENDENT_AMBULATORY_CARE_PROVIDER_SITE_OTHER): Payer: 59 | Admitting: Family

## 2017-10-21 ENCOUNTER — Encounter: Payer: Self-pay | Admitting: Family

## 2017-10-21 VITALS — BP 100/70 | HR 89 | Temp 98.5°F | Resp 18 | Ht 67.0 in | Wt 270.2 lb

## 2017-10-21 DIAGNOSIS — Z0000 Encounter for general adult medical examination without abnormal findings: Secondary | ICD-10-CM | POA: Diagnosis not present

## 2017-10-21 LAB — CBC WITH DIFFERENTIAL/PLATELET
BASOS PCT: 0.4 % (ref 0.0–3.0)
Basophils Absolute: 0 10*3/uL (ref 0.0–0.1)
Eosinophils Absolute: 0.2 10*3/uL (ref 0.0–0.7)
Eosinophils Relative: 1.6 % (ref 0.0–5.0)
HCT: 36 % (ref 36.0–46.0)
Hemoglobin: 11.5 g/dL — ABNORMAL LOW (ref 12.0–15.0)
LYMPHS ABS: 3.5 10*3/uL (ref 0.7–4.0)
Lymphocytes Relative: 28.4 % (ref 12.0–46.0)
MCHC: 32.1 g/dL (ref 30.0–36.0)
MCV: 77.9 fl — AB (ref 78.0–100.0)
MONO ABS: 0.5 10*3/uL (ref 0.1–1.0)
MONOS PCT: 3.7 % (ref 3.0–12.0)
NEUTROS ABS: 8.1 10*3/uL — AB (ref 1.4–7.7)
Neutrophils Relative %: 65.9 % (ref 43.0–77.0)
PLATELETS: 349 10*3/uL (ref 150.0–400.0)
RBC: 4.62 Mil/uL (ref 3.87–5.11)
RDW: 16.2 % — AB (ref 11.5–15.5)
WBC: 12.4 10*3/uL — ABNORMAL HIGH (ref 4.0–10.5)

## 2017-10-21 LAB — URINALYSIS, ROUTINE W REFLEX MICROSCOPIC
Bilirubin Urine: NEGATIVE
HGB URINE DIPSTICK: NEGATIVE
Ketones, ur: NEGATIVE
Leukocytes, UA: NEGATIVE
NITRITE: NEGATIVE
Total Protein, Urine: NEGATIVE
Urine Glucose: NEGATIVE
Urobilinogen, UA: 0.2 (ref 0.0–1.0)
pH: 5 (ref 5.0–8.0)

## 2017-10-21 NOTE — Progress Notes (Signed)
Subjective:    Patient ID: Kristy Chung, female    DOB: Apr 09, 1986, 32 y.o.   MRN: [id number]  HPI  Patient presents today for complete physical.  Immunizations: tetanus up to date, pneumovax.  Diet:  Not eating out much Wt Readings from Last 3 Encounters:  10/21/17 270 lb 3.2 oz (122.6 kg)  09/16/17 270 lb 6.4 oz (122.7 kg)  04/18/17 277 lb (125.6 kg)  Exercise: not regularly, wants to to better Pap Smear: 04/18/17 Eye: up to date Dental: up to date            Assessment & Plan:       Review of Systems  Constitutional: Negative for unexpected weight change.  HENT: Negative for hearing loss and rhinorrhea.   Eyes: Negative for visual disturbance.  Respiratory: Negative for cough.   Cardiovascular: Negative for leg swelling.  Gastrointestinal: Negative for constipation, diarrhea, nausea and vomiting.  Genitourinary: Negative for menstrual problem.  Musculoskeletal: Negative for arthralgias and myalgias.  Skin: Negative for rash.  Neurological:       Occasional headaches  Hematological: Negative for adenopathy.  Psychiatric/Behavioral:       Denies depression/anxiety       Past Medical History:  Diagnosis Date  . Allergy   . History of chicken pox      Social History   Socioeconomic History  . Marital status: Single    Spouse name: Not on file  . Number of children: Not on file  . Years of education: Not on file  . Highest education level: Not on file  Occupational History  . Not on file  Social Needs  . Financial resource strain: Not on file  . Food insecurity:    Worry: Not on file    Inability: Not on file  . Transportation needs:    Medical: Not on file    Non-medical: Not on file  Tobacco Use  . Smoking status: Never Smoker  . Smokeless tobacco: Never Used  Substance and Sexual Activity  . Alcohol use: Yes    Alcohol/week: 0.0 oz    Comment: occasional  . Drug use: No  . Sexual activity: Never    Comment: INTERCOURSE AGE 9,  SEXUAL PARTNERS 5  Lifestyle  . Physical activity:    Days per week: Not on file    Minutes per session: Not on file  . Stress: Not on file  Relationships  . Social connections:    Talks on phone: Not on file    Gets together: Not on file    Attends religious service: Not on file    Active member of club or organization: Not on file    Attends meetings of clubs or organizations: Not on file    Relationship status: Not on file  . Intimate partner violence:    Fear of current or ex partner: Not on file    Emotionally abused: Not on file    Physically abused: Not on file    Forced sexual activity: Not on file  Other Topics Concern  . 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 WISDOM TOOTH EXTRACTION  2005    Family History  Problem Relation Age of Onset  . Hyperlipidemia Mother   .  Depression Mother   . Hyperlipidemia Father   . Hypertension Father   . Hypothyroidism Maternal Grandmother   . Heart disease Maternal Grandfather        MI, stent, CAD  . Hypertension Maternal Grandfather   . Hyperlipidemia Maternal Grandfather   . COPD Paternal Grandmother   . Hypertension Paternal Grandmother   . Hyperlipidemia Paternal Grandmother   . Heart disease Paternal Grandmother   . AAA (abdominal aortic aneurysm) Paternal Grandfather   . Dementia Paternal Grandfather   . Heart disease Paternal Grandfather     Allergies  Allergen Reactions  . Amlodipine     flushing  . Other     CINNAMON GUM--blisters.    Current Outpatient Medications on File Prior to Visit  Medication Sig Dispense Refill  . atorvastatin (LIPITOR) 40 MG tablet Take 1 tablet (40 mg total) by mouth daily. 90 tablet 1  . Benzoyl Peroxide-Erythromycin 5-3 % PACK Apply 1 application topically 2 (two)  times daily. 1 each 5  . glucose blood (FREESTYLE TEST STRIPS) test strip Use as instructed (Patient taking differently: Use as instructed to check blood sugar 1 to 2 times daily.) 100 each 12  . glucose monitoring kit (FREESTYLE) monitoring kit 1 each by Does not apply route as needed for other. 1 each 0  . hydrochlorothiazide (HYDRODIURIL) 25 MG tablet Take 1 tablet (25 mg total) by mouth daily. 90 tablet 1  . Lancets (FREESTYLE) lancets Use as instructed 100 each 12  . levothyroxine (SYNTHROID, LEVOTHROID) 50 MCG tablet Take 1 tablet (50 mcg total) by mouth daily before breakfast. 90 tablet 1  . metFORMIN (GLUCOPHAGE) 1000 MG tablet Take 1 tablet (1,000 mg total) by mouth 2 (two) times daily with a meal. 90 tablet 1  . metoprolol succinate (TOPROL-XL) 25 MG 24 hr tablet Take one tablet by mouth once daily 90 tablet 1  . Multiple Vitamin (MULTIVITAMIN) tablet Take 1 tablet by mouth daily.    . Omega-3 Fatty Acids (FISH OIL) 1000 MG CAPS Take 2,000 mg by mouth 2 (two) times daily.    Marland Kitchen omeprazole (PRILOSEC) 20 MG capsule TAKE 1 CAPSULE (20 MG TOTAL) BY MOUTH DAILY. 90 capsule 1  . spironolactone (ALDACTONE) 50 MG tablet TAKE 1 TABLET (50 MG TOTAL) BY MOUTH DAILY. 90 tablet 3  . traZODone (DESYREL) 50 MG tablet Take 0.5-1 tablets (25-50 mg total) by mouth at bedtime as needed for sleep. 45 tablet 1  . vitamin B-12 (CYANOCOBALAMIN) 1000 MCG tablet Take 1,000 mcg by mouth daily.     No current facility-administered medications on file prior to visit.     BP 100/70 (BP Location: Right Arm, Cuff Size: Large)   Pulse 89   Temp 98.5 F (36.9 C) (Oral)   Resp 18   Ht _0  (1.702 m)   Wt 270 lb 3.2 oz (122.6 kg)   SpO2 99%   BMI 42.32 kg/m    Objective:   Physical Exam Physical Exam  Constitutional: She is oriented to person, place, and time. She appears well-developed and well-nourished. No distress.  HENT:  Head: Normocephalic and atraumatic.  Right Ear: Tympanic membrane and ear canal  normal.  Left Ear: Tympanic membrane and ear canal normal.  Mouth/Throat: Oropharynx is clear and moist.  Eyes: Pupils are equal, round, and reactive to light. No scleral icterus.  Neck: Normal range of motion. No thyromegaly present.  Cardiovascular: Normal rate and regular rhythm.   No murmur heard. Pulmonary/Chest: Effort normal and breath sounds normal.  No respiratory distress. He has no wheezes. She has no rales. She exhibits no tenderness.  Abdominal: Soft. Bowel sounds are normal. She exhibits no distension and no mass. There is no tenderness. There is no rebound and no guarding.  Musculoskeletal: She exhibits no edema.  Lymphadenopathy:    She has no cervical adenopathy.  Neurological: She is alert and oriented to person, place, and time. She has normal patellar reflexes. She exhibits normal muscle tone. Coordination normal.  Skin: Skin is warm and dry.  Psychiatric: She has a normal mood and affect. Her behavior is normal. Judgment and thought content normal.  Breast/pelvic: deferred to GYN.          Assessment & Plan:    Preventative care- discussed healthy diet, exercise, weight loss. Pap up to date.  Immunizations reviewed and up to date.  Check cbc, ua. Other labs up to date.       Assessment & Plan:

## 2017-10-21 NOTE — Patient Instructions (Signed)
Continue to work on [employer], exercise and weight loss.

## 2017-10-22 ENCOUNTER — Other Ambulatory Visit: Payer: Self-pay | Admitting: Family

## 2017-10-24 ENCOUNTER — Telehealth: Payer: Self-pay

## 2017-10-24 NOTE — Telephone Encounter (Signed)
-----   Message from Debbrah Alar, NP sent at 10/22/2017  9:12 PM EDT ----- Lab work shows mild anemia.  I would like her to add multivitamin with iron once daily. (is she already taking one with iron?) Repeat CBC in 1 month. WBC also mildly elevated. Will see how this looks on follow up. Call if fever/new symptoms.

## 2017-10-24 NOTE — Telephone Encounter (Signed)
Author phoned pt. to relay recent lab results and Melissa, NP's recommendation to start multivitamin with iron d/t mild anemia. Author left VM with call back number [phone]. Repeat CBC appointment needed in one month. PEC OK to relay lab results, ask about any new symptoms, and schedule a lab appointment for CBC as directed by Lenna Sciara, NP.

## 2017-10-25 ENCOUNTER — Encounter: Payer: Self-pay | Admitting: Family

## 2017-10-25 DIAGNOSIS — D72829 Elevated white blood cell count, unspecified: Secondary | ICD-10-CM

## 2017-10-25 DIAGNOSIS — D649 Anemia, unspecified: Secondary | ICD-10-CM

## 2017-11-16 ENCOUNTER — Other Ambulatory Visit (INDEPENDENT_AMBULATORY_CARE_PROVIDER_SITE_OTHER): Payer: 59

## 2017-11-16 DIAGNOSIS — D72829 Elevated white blood cell count, unspecified: Secondary | ICD-10-CM

## 2017-11-16 DIAGNOSIS — D649 Anemia, unspecified: Secondary | ICD-10-CM | POA: Diagnosis not present

## 2017-11-16 LAB — CBC WITH DIFFERENTIAL/PLATELET
BASOS ABS: 0 10*3/uL (ref 0.0–0.1)
Basophils Relative: 0.4 % (ref 0.0–3.0)
EOS ABS: 0.2 10*3/uL (ref 0.0–0.7)
Eosinophils Relative: 1.7 % (ref 0.0–5.0)
HCT: 34.5 % — ABNORMAL LOW (ref 36.0–46.0)
Hemoglobin: 11.1 g/dL — ABNORMAL LOW (ref 12.0–15.0)
Lymphocytes Relative: 40.4 % (ref 12.0–46.0)
Lymphs Abs: 4 10*3/uL (ref 0.7–4.0)
MCHC: 32.2 g/dL (ref 30.0–36.0)
MCV: 78 fl (ref 78.0–100.0)
MONO ABS: 0.5 10*3/uL (ref 0.1–1.0)
Monocytes Relative: 5.5 % (ref 3.0–12.0)
NEUTROS PCT: 52 % (ref 43.0–77.0)
Neutro Abs: 5.2 10*3/uL (ref 1.4–7.7)
Platelets: 321 10*3/uL (ref 150.0–400.0)
RBC: 4.43 Mil/uL (ref 3.87–5.11)
RDW: 16.4 % — ABNORMAL HIGH (ref 11.5–15.5)
WBC: 9.9 10*3/uL (ref 4.0–10.5)

## 2017-12-04 ENCOUNTER — Emergency Department (HOSPITAL_BASED_OUTPATIENT_CLINIC_OR_DEPARTMENT_OTHER)
Admission: EM | Admit: 2017-12-04 | Discharge: 2017-12-04 | Disposition: A | Discharge status: Home / Self Care | Payer: PRIVATE HEALTH INSURANCE | Attending: Emergency Medicine | Admitting: Emergency Medicine

## 2017-12-04 ENCOUNTER — Emergency Department (HOSPITAL_BASED_OUTPATIENT_CLINIC_OR_DEPARTMENT_OTHER): Payer: PRIVATE HEALTH INSURANCE

## 2017-12-04 ENCOUNTER — Other Ambulatory Visit: Payer: Self-pay

## 2017-12-04 DIAGNOSIS — M545 Low back pain: Secondary | ICD-10-CM | POA: Diagnosis not present

## 2017-12-04 DIAGNOSIS — S299XXA Unspecified injury of thorax, initial encounter: Secondary | ICD-10-CM | POA: Diagnosis not present

## 2017-12-04 DIAGNOSIS — Y999 Unspecified external cause status: Secondary | ICD-10-CM | POA: Insufficient documentation

## 2017-12-04 DIAGNOSIS — Z79899 Other long term (current) drug therapy: Secondary | ICD-10-CM | POA: Diagnosis not present

## 2017-12-04 DIAGNOSIS — I10 Essential (primary) hypertension: Secondary | ICD-10-CM | POA: Insufficient documentation

## 2017-12-04 DIAGNOSIS — Y92410 Unspecified street and highway as the place of occurrence of the external cause: Secondary | ICD-10-CM | POA: Insufficient documentation

## 2017-12-04 DIAGNOSIS — M546 Pain in thoracic spine: Secondary | ICD-10-CM | POA: Diagnosis not present

## 2017-12-04 DIAGNOSIS — S39012A Strain of muscle, fascia and tendon of lower back, initial encounter: Secondary | ICD-10-CM | POA: Diagnosis not present

## 2017-12-04 DIAGNOSIS — Z7984 Long term (current) use of oral hypoglycemic drugs: Secondary | ICD-10-CM | POA: Insufficient documentation

## 2017-12-04 DIAGNOSIS — E119 Type 2 diabetes mellitus without complications: Secondary | ICD-10-CM | POA: Diagnosis not present

## 2017-12-04 DIAGNOSIS — Y939 Activity, unspecified: Secondary | ICD-10-CM | POA: Insufficient documentation

## 2017-12-04 DIAGNOSIS — S3992XA Unspecified injury of lower back, initial encounter: Secondary | ICD-10-CM | POA: Diagnosis not present

## 2017-12-04 LAB — PREGNANCY, URINE: PREG TEST UR: NEGATIVE

## 2017-12-04 MED ORDER — CYCLOBENZAPRINE HCL 10 MG PO TABS: 10.0000 mg | ORAL_TABLET | Freq: Two times a day (BID) | ORAL | 0 refills | Status: DC | PRN
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
## 2017-12-12 ENCOUNTER — Encounter: Payer: Self-pay | Admitting: Family

## 2017-12-12 ENCOUNTER — Ambulatory Visit: Payer: 59 | Admitting: Family

## 2017-12-12 VITALS — BP 114/78 | HR 83 | Temp 98.3°F | Resp 18 | Ht 67.0 in | Wt 278.0 lb

## 2017-12-12 DIAGNOSIS — J029 Acute pharyngitis, unspecified: Secondary | ICD-10-CM

## 2017-12-12 LAB — POCT RAPID STREP A (OFFICE): Rapid Strep A Screen: NEGATIVE

## 2017-12-12 MED FILL — CYCLOBENZAPRINE HCL 10 MG T: 10 | 10 days supply | Qty: 20 | Fill #0

## 2017-12-12 NOTE — Progress Notes (Signed)
Subjective:    Patient ID: Kristy Chung, female    DOB: 1985/12/25, 32 y.o.   MRN: [id number]  HPI  Patient is a 32 yr old female who presents today with chief complaint of sore throat. Reports throat "feels like glass" when she swallows on the right side. Denies fever.  Does have some associated right sided jaw and ear pain.    Review of Systems    see HPI  Past Medical History:  Diagnosis Date  . Allergy   . History of chicken pox      Social History   Socioeconomic History  . Marital status: Single    Spouse name: Not on file  . Number of children: Not on file  . Years of education: Not on file  . Highest education level: Not on file  Occupational History  . Not on file  Social Needs  . Financial resource strain: Not on file  . Food insecurity:    Worry: Not on file    Inability: Not on file  . Transportation needs:    Medical: Not on file    Non-medical: Not on file  Tobacco Use  . Smoking status: Never Smoker  . Smokeless tobacco: Never Used  Substance and Sexual Activity  . Alcohol use: Yes    Alcohol/week: 0.0 oz    Comment: occasional  . Drug use: No  . Sexual activity: Never    Comment: INTERCOURSE AGE 59, SEXUAL PARTNERS 5  Lifestyle  . Physical activity:    Days per week: Not on file    Minutes per session: Not on file  . Stress: Not on file  Relationships  . Social connections:    Talks on phone: Not on file    Gets together: Not on file    Attends religious service: Not on file    Active member of club or organization: Not on file    Attends meetings of clubs or organizations: Not on file    Relationship status: Not on file  . Intimate partner violence:    Fear of current or ex partner: Not on file    Emotionally abused: Not on file    Physically abused: Not on file    Forced sexual activity: Not on file  Other Topics Concern  . 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 WISDOM TOOTH EXTRACTION  2005    Family History  Problem Relation Age of Onset  . Hyperlipidemia Mother   . Depression Mother   . Hyperlipidemia Father   . Hypertension Father   . Hypothyroidism Maternal Grandmother   . Heart disease Maternal Grandfather        MI, stent, CAD  . Hypertension Maternal Grandfather   . Hyperlipidemia Maternal Grandfather   . COPD Paternal Grandmother   . Hypertension Paternal Grandmother   . Hyperlipidemia Paternal Grandmother   . Heart disease Paternal Grandmother   . AAA (abdominal aortic aneurysm) Paternal Grandfather   . Dementia Paternal Grandfather   . Heart disease Paternal Grandfather     Allergies  Allergen Reactions  . Amlodipine     flushing  . Other     CINNAMON GUM--blisters.    Current Outpatient Medications on File Prior  to Visit  Medication Sig Dispense Refill  . atorvastatin (LIPITOR) 40 MG tablet Take 1 tablet (40 mg total) by mouth daily. 90 tablet 1  . glucose blood (FREESTYLE TEST STRIPS) test strip Use as instructed (Patient taking differently: Use as instructed to check blood sugar 1 to 2 times daily.) 100 each 12  . glucose monitoring kit (FREESTYLE) monitoring kit 1 each by Does not apply route as needed for other. 1 each 0  . hydrochlorothiazide (HYDRODIURIL) 25 MG tablet Take 1 tablet (25 mg total) by mouth daily. 90 tablet 1  . Lancets (FREESTYLE) lancets Use as instructed 100 each 12  . levothyroxine (SYNTHROID, LEVOTHROID) 50 MCG tablet Take 1 tablet (50 mcg total) by mouth daily before breakfast. 90 tablet 1  . metFORMIN (GLUCOPHAGE) 1000 MG tablet Take 1 tablet (1,000 mg total) by mouth 2 (two) times daily with a meal. 90 tablet 1  . metoprolol succinate (TOPROL-XL) 25 MG 24 hr tablet Take one tablet by mouth once daily 90 tablet 1  . Multiple Vitamins-Iron  (MULTI-VITAMIN/IRON) TABS Take 1 tablet by mouth daily.  0  . Omega-3 Fatty Acids (FISH OIL) 1000 MG CAPS Take 2,000 mg by mouth 2 (two) times daily.    Marland Kitchen omeprazole (PRILOSEC) 20 MG capsule TAKE 1 CAPSULE (20 MG TOTAL) BY MOUTH DAILY. 90 capsule 1  . spironolactone (ALDACTONE) 50 MG tablet TAKE 1 TABLET (50 MG TOTAL) BY MOUTH DAILY. 90 tablet 3  . traZODone (DESYREL) 50 MG tablet Take 0.5-1 tablets (25-50 mg total) by mouth at bedtime as needed for sleep. 45 tablet 1  . vitamin B-12 (CYANOCOBALAMIN) 1000 MCG tablet Take 1,000 mcg by mouth daily.    . cyclobenzaprine (FLEXERIL) 10 MG tablet Take 1 tablet (10 mg total) by mouth 2 (two) times daily as needed for muscle spasms. (Patient not taking: Reported on 12/12/2017) 20 tablet 0   No current facility-administered medications on file prior to visit.     BP 114/78 (BP Location: Left Arm, Cuff Size: Large)   Pulse 83   Temp 98.3 F (36.8 C) (Oral)   Resp 18   Ht 5' 7"  (1.702 m)   Wt 278 lb (126.1 kg)   LMP 12/12/2017   SpO2 98%   BMI 43.54 kg/m    Objective:   Physical Exam  Constitutional: She appears well-developed and well-nourished.  HENT:  Head: Normocephalic and atraumatic.  Right Ear: Hearing and tympanic membrane normal.  Left Ear: Hearing and tympanic membrane normal.  Mouth/Throat: Oropharynx is clear and moist. No oropharyngeal exudate, posterior oropharyngeal edema or posterior oropharyngeal erythema.  Neck:  Mild cervical LAD with some tenderness of right upper cervical LN  Cardiovascular: Normal rate, regular rhythm and normal heart sounds.  No murmur heard. Pulmonary/Chest: Effort normal and breath sounds normal. No respiratory distress. She has no wheezes.  Psychiatric: She has a normal mood and affect. Her behavior is normal. Judgment and thought content normal.          Assessment & Plan:  Viral pharyngitis- rapid strep negative. Advised patient as follows:   Begin chloraseptic spray and ibuprofen as  needed.  Call if new/worsening symptoms, fever, or if symptoms are not improved in 3 days.  Pt verbalizes understanding.

## 2017-12-12 NOTE — Patient Instructions (Signed)
Begin chloraseptic spray and ibuprofen as needed.  Call if new/worsening symptoms or if symptoms are not improved in 3 days.

## 2017-12-19 MED FILL — SPIRONOLACTONE 50 MG TAB: 50 | 90 days supply | Qty: 90 | Fill #2

## 2017-12-19 MED FILL — traZODone HCL 50 MG TABS: 50 | 45 days supply | Qty: 45 | Fill #1

## 2017-12-19 MED FILL — metFORMIN HCL 1000 MG TABS: 1000 | 90 days supply | Qty: 180 | Fill #0

## 2017-12-19 MED FILL — ATORVASTATIN 40 MG TABLET: 40 | 90 days supply | Qty: 90 | Fill #1

## 2018-01-23 MED FILL — OMEPRAZOLE 20 MG CPDR: 20 | 90 days supply | Qty: 90 | Fill #1

## 2018-02-09 MED FILL — traZODone HCL 50 MG TABS: 50 | 45 days supply | Qty: 45 | Fill #1

## 2018-02-10 ENCOUNTER — Other Ambulatory Visit: Payer: Self-pay | Admitting: Nurse Practitioner

## 2018-02-10 DIAGNOSIS — D134 Benign neoplasm of liver: Secondary | ICD-10-CM

## 2018-03-06 MED FILL — LEVOTHYROXINE 50 MCG TABLET: 50 | 90 days supply | Qty: 90 | Fill #1

## 2018-03-06 MED FILL — METOPROLOL SUCCINATE ER 25: 25 | 90 days supply | Qty: 90 | Fill #1

## 2018-03-15 DIAGNOSIS — K760 Fatty (change of) liver, not elsewhere classified: Secondary | ICD-10-CM | POA: Diagnosis not present

## 2018-03-16 ENCOUNTER — Emergency Department (HOSPITAL_BASED_OUTPATIENT_CLINIC_OR_DEPARTMENT_OTHER)
Admission: EM | Admit: 2018-03-16 | Discharge: 2018-03-16 | Disposition: A | Discharge status: Home / Self Care | Payer: 59 | Attending: Emergency Medicine | Admitting: Emergency Medicine

## 2018-03-16 ENCOUNTER — Emergency Department (HOSPITAL_BASED_OUTPATIENT_CLINIC_OR_DEPARTMENT_OTHER): Payer: 59

## 2018-03-16 ENCOUNTER — Other Ambulatory Visit: Payer: Self-pay

## 2018-03-16 ENCOUNTER — Ambulatory Visit
Admission: RE | Admit: 2018-03-16 | Discharge: 2018-03-16 | Disposition: A | Discharge status: Home / Self Care | Payer: 59 | Source: Ambulatory Visit | Attending: Nurse Practitioner | Admitting: Nurse Practitioner

## 2018-03-16 ENCOUNTER — Encounter (HOSPITAL_BASED_OUTPATIENT_CLINIC_OR_DEPARTMENT_OTHER): Payer: Self-pay | Admitting: Emergency Medicine

## 2018-03-16 DIAGNOSIS — D134 Benign neoplasm of liver: Secondary | ICD-10-CM

## 2018-03-16 DIAGNOSIS — E039 Hypothyroidism, unspecified: Secondary | ICD-10-CM | POA: Insufficient documentation

## 2018-03-16 DIAGNOSIS — I10 Essential (primary) hypertension: Secondary | ICD-10-CM | POA: Diagnosis not present

## 2018-03-16 DIAGNOSIS — Z79899 Other long term (current) drug therapy: Secondary | ICD-10-CM | POA: Insufficient documentation

## 2018-03-16 DIAGNOSIS — Z7984 Long term (current) use of oral hypoglycemic drugs: Secondary | ICD-10-CM | POA: Insufficient documentation

## 2018-03-16 DIAGNOSIS — R932 Abnormal findings on diagnostic imaging of liver and biliary tract: Secondary | ICD-10-CM | POA: Diagnosis not present

## 2018-03-16 DIAGNOSIS — K7689 Other specified diseases of liver: Secondary | ICD-10-CM | POA: Diagnosis not present

## 2018-03-16 DIAGNOSIS — E119 Type 2 diabetes mellitus without complications: Secondary | ICD-10-CM | POA: Insufficient documentation

## 2018-03-16 DIAGNOSIS — M542 Cervicalgia: Secondary | ICD-10-CM | POA: Insufficient documentation

## 2018-03-16 DIAGNOSIS — M546 Pain in thoracic spine: Secondary | ICD-10-CM | POA: Diagnosis not present

## 2018-03-16 MED ORDER — GADOXETATE DISODIUM 0.25 MMOL/ML IV SOLN
10.0000 mL | Freq: Once | INTRAVENOUS | Status: AC | PRN
  Administered 2018-03-16: 10 mL via INTRAVENOUS

## 2018-03-16 MED ORDER — METHOCARBAMOL 500 MG PO TABS: 500.0000 mg | ORAL_TABLET | Freq: Two times a day (BID) | ORAL | 0 refills | Status: DC

## 2018-03-16 MED ORDER — KETOROLAC TROMETHAMINE 15 MG/ML IJ SOLN
15.0000 mg | Freq: Once | INTRAMUSCULAR | Status: AC
  Administered 2018-03-16: 15 mg via INTRAMUSCULAR
  Filled 2018-03-16: qty 1

## 2018-03-16 MED ORDER — METHOCARBAMOL 500 MG PO TABS
750.0000 mg | ORAL_TABLET | Freq: Once | ORAL | Status: AC
  Administered 2018-03-16: 750 mg via ORAL
  Filled 2018-03-16: qty 2

## 2018-03-16 MED ORDER — NAPROXEN 500 MG PO TABS: 500.0000 mg | ORAL_TABLET | Freq: Two times a day (BID) | ORAL | 0 refills | Status: DC
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
## 2018-03-17 MED FILL — METHOCARBAMOL 500 MG TABLET: 500 | 10 days supply | Qty: 20 | Fill #0

## 2018-03-17 MED FILL — NAPROXEN 500 MG TABLET: 500 | 15 days supply | Qty: 30 | Fill #0

## 2018-03-28 ENCOUNTER — Other Ambulatory Visit: Payer: Self-pay | Admitting: Family

## 2018-03-28 MED FILL — traZODone HCL 50 MG TABS: 50 | 45 days supply | Qty: 45 | Fill #0

## 2018-03-28 MED FILL — metFORMIN HCL 1000 MG TABS: 1000 | 90 days supply | Qty: 180 | Fill #0

## 2018-03-29 MED FILL — HYDROCHLOROTHIAZIDE 25 MG T: 25 | 90 days supply | Qty: 90 | Fill #1

## 2018-04-10 ENCOUNTER — Ambulatory Visit: Payer: 59 | Admitting: Family

## 2018-04-10 ENCOUNTER — Encounter: Payer: Self-pay | Admitting: Family

## 2018-04-10 VITALS — BP 118/72 | HR 82 | Temp 98.1°F | Ht 67.0 in | Wt 275.2 lb

## 2018-04-10 DIAGNOSIS — E119 Type 2 diabetes mellitus without complications: Secondary | ICD-10-CM | POA: Diagnosis not present

## 2018-04-10 DIAGNOSIS — I10 Essential (primary) hypertension: Secondary | ICD-10-CM | POA: Diagnosis not present

## 2018-04-10 DIAGNOSIS — E785 Hyperlipidemia, unspecified: Secondary | ICD-10-CM | POA: Diagnosis not present

## 2018-04-10 DIAGNOSIS — D649 Anemia, unspecified: Secondary | ICD-10-CM

## 2018-04-10 DIAGNOSIS — L680 Hirsutism: Secondary | ICD-10-CM

## 2018-04-10 MED ORDER — LEVOTHYROXINE SODIUM 50 MCG PO TABS: 50.0000 ug | ORAL_TABLET | Freq: Every day | ORAL | 1 refills | Status: DC

## 2018-04-10 MED ORDER — OMEPRAZOLE 20 MG PO CPDR: DELAYED_RELEASE_CAPSULE | ORAL | 1 refills | Status: DC

## 2018-04-10 MED ORDER — METOPROLOL SUCCINATE ER 25 MG PO TB24: ORAL_TABLET | ORAL | 1 refills | Status: DC

## 2018-04-10 MED ORDER — HYDROCHLOROTHIAZIDE 25 MG PO TABS: 25.0000 mg | ORAL_TABLET | Freq: Every day | ORAL | 1 refills | Status: DC

## 2018-04-10 MED ORDER — SPIRONOLACTONE 50 MG PO TABS: 50.0000 mg | ORAL_TABLET | Freq: Every day | ORAL | 1 refills | Status: DC

## 2018-04-10 MED ORDER — ATORVASTATIN CALCIUM 40 MG PO TABS: 40.0000 mg | ORAL_TABLET | Freq: Every day | ORAL | 1 refills | Status: DC
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
## 2018-04-10 NOTE — Patient Instructions (Signed)
Please schedule a routine lab work.

## 2018-04-12 ENCOUNTER — Other Ambulatory Visit: Payer: 59

## 2018-04-12 ENCOUNTER — Other Ambulatory Visit (INDEPENDENT_AMBULATORY_CARE_PROVIDER_SITE_OTHER): Payer: 59

## 2018-04-12 DIAGNOSIS — E785 Hyperlipidemia, unspecified: Secondary | ICD-10-CM | POA: Diagnosis not present

## 2018-04-12 DIAGNOSIS — D649 Anemia, unspecified: Secondary | ICD-10-CM

## 2018-04-12 DIAGNOSIS — I10 Essential (primary) hypertension: Secondary | ICD-10-CM | POA: Diagnosis not present

## 2018-04-12 DIAGNOSIS — E119 Type 2 diabetes mellitus without complications: Secondary | ICD-10-CM | POA: Diagnosis not present

## 2018-04-12 LAB — CBC WITH DIFFERENTIAL/PLATELET
BASOS PCT: 0.4 % (ref 0.0–3.0)
Basophils Absolute: 0 10*3/uL (ref 0.0–0.1)
EOS PCT: 1.4 % (ref 0.0–5.0)
Eosinophils Absolute: 0.1 10*3/uL (ref 0.0–0.7)
HCT: 35.6 % — ABNORMAL LOW (ref 36.0–46.0)
Hemoglobin: 11.4 g/dL — ABNORMAL LOW (ref 12.0–15.0)
LYMPHS ABS: 3.2 10*3/uL (ref 0.7–4.0)
Lymphocytes Relative: 30.6 % (ref 12.0–46.0)
MCHC: 32.1 g/dL (ref 30.0–36.0)
MCV: 75.9 fl — AB (ref 78.0–100.0)
MONO ABS: 0.5 10*3/uL (ref 0.1–1.0)
MONOS PCT: 4.6 % (ref 3.0–12.0)
NEUTROS PCT: 63 % (ref 43.0–77.0)
Neutro Abs: 6.6 10*3/uL (ref 1.4–7.7)
Platelets: 334 10*3/uL (ref 150.0–400.0)
RBC: 4.68 Mil/uL (ref 3.87–5.11)
RDW: 17 % — AB (ref 11.5–15.5)
WBC: 10.5 10*3/uL (ref 4.0–10.5)

## 2018-04-12 LAB — LIPID PANEL
CHOL/HDL RATIO: 4
CHOLESTEROL: 183 mg/dL (ref 0–200)
HDL: 46.6 mg/dL (ref >39.00)
NonHDL: 136.84
Triglycerides: 239 mg/dL — ABNORMAL HIGH (ref 0.0–149.0)
VLDL: 47.8 mg/dL — AB (ref 0.0–40.0)

## 2018-04-12 LAB — HEPATIC FUNCTION PANEL
ALK PHOS: 101 U/L (ref 39–117)
ALT: 38 U/L — AB (ref 0–35)
AST: 24 U/L (ref 0–37)
Albumin: 4.4 g/dL (ref 3.5–5.2)
BILIRUBIN DIRECT: 0.1 mg/dL (ref 0.0–0.3)
Total Bilirubin: 0.6 mg/dL (ref 0.2–1.2)
Total Protein: 7.2 g/dL (ref 6.0–8.3)

## 2018-04-12 LAB — BASIC METABOLIC PANEL
BUN: 14 mg/dL (ref 6–23)
CHLORIDE: 101 meq/L (ref 96–112)
CO2: 24 mEq/L (ref 19–32)
Calcium: 9.7 mg/dL (ref 8.4–10.5)
Creatinine, Ser: 0.59 mg/dL (ref 0.40–1.20)
GFR: 125.29 mL/min (ref >60.00)
Glucose, Bld: 107 mg/dL — ABNORMAL HIGH (ref 70–99)
POTASSIUM: 4 meq/L (ref 3.5–5.1)
Sodium: 136 mEq/L (ref 135–145)

## 2018-04-12 LAB — IRON: IRON: 43 ug/dL (ref 42–145)

## 2018-04-12 LAB — LDL CHOLESTEROL, DIRECT: Direct LDL: 117 mg/dL

## 2018-04-12 LAB — HEMOGLOBIN A1C: Hgb A1c MFr Bld: 6.7 % — ABNORMAL HIGH (ref 4.6–6.5)

## 2018-05-04 DIAGNOSIS — K760 Fatty (change of) liver, not elsewhere classified: Secondary | ICD-10-CM | POA: Diagnosis not present

## 2018-05-04 DIAGNOSIS — D134 Benign neoplasm of liver: Secondary | ICD-10-CM | POA: Diagnosis not present

## 2018-05-05 MED FILL — SPIRONOLACTONE 50 MG TABLET: 50 | 90 days supply | Qty: 90 | Fill #3

## 2018-05-05 MED FILL — traZODone HCL 50 MG TABS: 50 | 45 days supply | Qty: 45 | Fill #1

## 2018-05-05 MED FILL — ATORVASTATIN 40 MG TABLET: 40 | 90 days supply | Qty: 90 | Fill #0

## 2018-05-05 MED FILL — OMEPRAZOLE 20 MG CPDR: 20 | 90 days supply | Qty: 90 | Fill #0

## 2018-06-13 ENCOUNTER — Ambulatory Visit (INDEPENDENT_AMBULATORY_CARE_PROVIDER_SITE_OTHER): Payer: 59 | Admitting: Women's Health

## 2018-06-13 ENCOUNTER — Encounter: Payer: Self-pay | Admitting: Women's Health

## 2018-06-13 VITALS — BP 124/82 | Ht 67.0 in | Wt 277.0 lb

## 2018-06-13 DIAGNOSIS — Z1151 Encounter for screening for human papillomavirus (HPV): Secondary | ICD-10-CM | POA: Diagnosis not present

## 2018-06-13 DIAGNOSIS — Z01419 Encounter for gynecological examination (general) (routine) without abnormal findings: Secondary | ICD-10-CM | POA: Diagnosis not present

## 2018-06-13 NOTE — Patient Instructions (Signed)

## 2018-06-13 NOTE — Progress Notes (Signed)
Kristy Chung May 26, 1985 [id number]    History:    Presents for annual exam.  Monthly cycle/condoms.  Not sexually active break-up with long-term partner no infidelity.  Denies need for STD screen.  Primary care manages hypertension, hypothyroidism, diabetes and hypercholesteremia.  Normal Pap history.  Completed Gardasil.  Past medical history, past surgical history, family history and social history were all reviewed and documented in the EPIC chart.  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  Parents healthy.  History of liver adenoma shrank after OCs stopped has had follow-up with no change in growth.  ROS:  A ROS was performed and pertinent positives and negatives are included.  Exam:  Vitals:   06/13/18 1055  BP: 124/82  Weight: 277 lb (125.6 kg)  Height: 5\' 7"  (1.702 m)   Body mass index is 43.38 kg/m.   General appearance:  Normal Thyroid:  Symmetrical, normal in size, without palpable masses or nodularity. Respiratory  Auscultation:  Clear without wheezing or rhonchi Cardiovascular  Auscultation:  Regular rate, without rubs, murmurs or gallops  Edema/varicosities:  Not grossly evident Abdominal  Soft,nontender, without masses, guarding or rebound.  Liver/spleen:  No organomegaly noted  Hernia:  None appreciated  Skin  Inspection:  Grossly normal   Breasts: Examined lying and sitting.     Right: Without masses, retractions, discharge or axillary adenopathy.     Left: Without masses, retractions, discharge or axillary adenopathy. Gentitourinary   Inguinal/mons:  Normal without inguinal adenopathy  External genitalia:  Normal  BUS/Urethra/Skene's glands:  Normal  Vagina:  Normal  Cervix:  Normal  Uterus:  normal in size, shape and contour.  Midline and mobile  Adnexa/parametria:     Rt: Without masses or tenderness.   Lt: Without masses or tenderness.  Anus and perineum: Normal  Digital rectal exam: Normal sphincter tone without palpated  masses or tenderness  Assessment/Plan:  33 y.o. S WF G0 for annual exam with no complaints.  Regular monthly cycle/not sexually active/condoms Morbid obesity Hypertension/hypothyroidism/diabetes/hypercholesteremia-primary care manages labs and meds  Plan: Contraception options reviewed declines will use condoms.  Denies need for STD screen.  SBEs, increase regular cardio type exercise, decrease calorie/carbs, MVI daily encouraged.  Pap with HR HPV typing, new screening guidelines reviewed.  [place], 1:04 PM 06/13/2018

## 2018-06-14 LAB — PAP, TP IMAGING W/ HPV RNA, RFLX HPV TYPE 16,18/45: HPV DNA High Risk: NOT DETECTED

## 2018-06-29 ENCOUNTER — Other Ambulatory Visit: Payer: Self-pay | Admitting: Family

## 2018-06-30 MED FILL — traZODone HCL 50 MG TABS: 50 | 45 days supply | Qty: 45 | Fill #0

## 2018-07-10 MED FILL — metFORMIN HCL 1000 MG TABS: 1000 | 90 days supply | Qty: 180 | Fill #1

## 2018-07-10 MED FILL — LEVOTHYROXINE 50 MCG TABLET: 50 | 90 days supply | Qty: 90 | Fill #0

## 2018-07-10 MED FILL — METOPROLOL SUCCINATE ER 25: 25 | 90 days supply | Qty: 90 | Fill #0

## 2018-08-02 MED FILL — ATORVASTATIN 40 MG TABLET: 40 | 90 days supply | Qty: 90 | Fill #1

## 2018-08-02 MED FILL — SPIRONOLACTONE 50 MG TABLET: 50 | 90 days supply | Qty: 90 | Fill #0

## 2018-08-02 MED FILL — HYDROCHLOROTHIAZIDE 25 MG T: 25 | 90 days supply | Qty: 90 | Fill #0

## 2018-08-02 MED FILL — OMEPRAZOLE 20 MG CPDR: 20 | 90 days supply | Qty: 90 | Fill #1

## 2018-08-22 MED FILL — traZODone HCL 50 MG TABS: 50 | 45 days supply | Qty: 45 | Fill #1

## 2018-10-04 ENCOUNTER — Other Ambulatory Visit: Payer: Self-pay | Admitting: Family

## 2018-10-04 ENCOUNTER — Telehealth: Payer: Self-pay | Admitting: Family

## 2018-10-04 MED FILL — traZODone HCL 50 MG TABS: 50 | 45 days supply | Qty: 45 | Fill #0

## 2018-10-04 NOTE — Telephone Encounter (Signed)
Done PT wasn't able to come in until next Friday due to her work schedule

## 2018-10-04 NOTE — Telephone Encounter (Signed)
Pt due for OV. Could you please contact pt to schedule a virtual visit?

## 2018-10-13 ENCOUNTER — Telehealth (INDEPENDENT_AMBULATORY_CARE_PROVIDER_SITE_OTHER): Payer: 59 | Admitting: Family

## 2018-10-13 ENCOUNTER — Encounter: Payer: Self-pay | Admitting: Family

## 2018-10-13 ENCOUNTER — Other Ambulatory Visit: Payer: Self-pay

## 2018-10-13 VITALS — BP 119/86 | HR 103 | Ht 67.0 in | Wt 270.0 lb

## 2018-10-13 DIAGNOSIS — E785 Hyperlipidemia, unspecified: Secondary | ICD-10-CM | POA: Diagnosis not present

## 2018-10-13 DIAGNOSIS — I10 Essential (primary) hypertension: Secondary | ICD-10-CM | POA: Diagnosis not present

## 2018-10-13 DIAGNOSIS — E119 Type 2 diabetes mellitus without complications: Secondary | ICD-10-CM | POA: Diagnosis not present

## 2018-10-13 DIAGNOSIS — E039 Hypothyroidism, unspecified: Secondary | ICD-10-CM | POA: Diagnosis not present

## 2018-10-13 MED ORDER — ATORVASTATIN CALCIUM 40 MG PO TABS: 40.0000 mg | ORAL_TABLET | Freq: Every day | ORAL | 1 refills | Status: DC

## 2018-10-13 MED ORDER — METFORMIN HCL 1000 MG PO TABS: 1000.0000 mg | ORAL_TABLET | Freq: Two times a day (BID) | ORAL | 1 refills | Status: DC

## 2018-10-13 MED ORDER — OMEPRAZOLE 20 MG PO CPDR: DELAYED_RELEASE_CAPSULE | ORAL | 1 refills | Status: DC

## 2018-10-13 MED FILL — OMEPRAZOLE 20 MG CPDR: 20 | 90 days supply | Qty: 90 | Fill #0

## 2018-10-13 MED FILL — metFORMIN HCL 1000 MG TABS: 1000 | 90 days supply | Qty: 180 | Fill #0

## 2018-10-13 MED FILL — ATORVASTATIN 40 MG TABLET: 40 | 90 days supply | Qty: 90 | Fill #0

## 2018-10-13 NOTE — Progress Notes (Signed)
Virtual Visit via Video Note  I connected with Emerson Monte on 10/13/18 at  3:40 PM EDT by a video enabled telemedicine application and verified that I am speaking with the correct person using two identifiers. This visit type was conducted due to national recommendations for restrictions regarding the COVID-19 Pandemic (e.g. social distancing).  This format is felt to be most appropriate for this patient at this time.   I discussed the limitations of evaluation and management by telemedicine and the availability of in person appointments. The patient expressed understanding and agreed to proceed.  Only the patient and myself were on today's video visit. The patient was at home and I was in my office at the time of today's visit.   History of Present Illness:  Patient is a 33 yr old female who presents today for follow up.  DM2-the patient is maintained on metformin.  Reports that sugar yesterday was 90.  Typical sugars are 160-180 2 hours post prandial.  Lab Results  Component Value Date   HGBA1C 6.7 (H) 04/12/2018   HGBA1C 6.9 (H) 09/16/2017   HGBA1C 6.9 (H) 04/18/2017   Lab Results  Component Value Date   MICROALBUR 0.9 09/16/2017   LDLCALC 104 (H) 09/16/2017   CREATININE 0.59 04/12/2018   HTN- She denies swelling.  She is maintained on hydrochlorothiazide, metoprolol, and Aldactone.   BP Readings from Last 3 Encounters:  10/13/18 119/86  06/13/18 124/82  04/10/18 118/72   Morbid obesity-reports that she has been working on increasing her exercise and has been doing significant amount of walking.  She has lost 7 pounds since her last visit. Wt Readings from Last 3 Encounters:  10/13/18 270 lb (122.5 kg)  06/13/18 277 lb (125.6 kg)  04/10/18 275 lb 3.2 oz (124.8 kg)   Hyperlipidemia-maintained on atorvastatin 40 mg once daily. Lab Results  Component Value Date   CHOL 183 04/12/2018   HDL 46.60 04/12/2018   LDLCALC 104 (H) 09/16/2017   LDLDIRECT 117.0 04/12/2018   TRIG 239.0 (H) 04/12/2018   CHOLHDL 4 04/12/2018   Hypothyroid- maintained on synthroid.  Lab Results  Component Value Date   TSH 2.15 09/16/2017    Observations/Objective:   Gen: Awake, alert, no acute distress Resp: Breathing is even and non-labored Psych: calm/pleasant demeanor Neuro: Alert and Oriented x 3, + facial symmetry, speech is clear.  Assessment and Plan:  Diabetes type 2- I commended the patient on her recent weight loss.  We will continue metformin.  Plan to have her return to the lab for an A1c.  Hypertension-blood pressure stable on current medications.  Continue same.  Will need follow-up electrolytes and kidney function.  Hyperlipidemia- tolerating statin.  Will obtain follow-up lipid panel.  Hypothyroid-clinically stable on current dose of Synthroid.  Continue same.  Obtain follow-up TSH.  Follow Up Instructions:    I discussed the assessment and treatment plan with the patient. The patient was provided an opportunity to ask questions and all were answered. The patient agreed with the plan and demonstrated an understanding of the instructions.   The patient was advised to call back or seek an in-person evaluation if the symptoms worsen or if the condition fails to improve as anticipated.    Nance Pear, NP

## 2018-10-25 ENCOUNTER — Other Ambulatory Visit: Payer: Self-pay

## 2018-10-25 ENCOUNTER — Other Ambulatory Visit (INDEPENDENT_AMBULATORY_CARE_PROVIDER_SITE_OTHER): Payer: 59

## 2018-10-25 DIAGNOSIS — E785 Hyperlipidemia, unspecified: Secondary | ICD-10-CM | POA: Diagnosis not present

## 2018-10-25 DIAGNOSIS — E039 Hypothyroidism, unspecified: Secondary | ICD-10-CM

## 2018-10-25 DIAGNOSIS — E119 Type 2 diabetes mellitus without complications: Secondary | ICD-10-CM | POA: Diagnosis not present

## 2018-10-26 LAB — MICROALBUMIN / CREATININE URINE RATIO
Creatinine,U: 62.8 mg/dL
Microalb Creat Ratio: 1.1 mg/g (ref 0.0–30.0)
Microalb, Ur: <0.7 mg/dL (ref 0.0–1.9)

## 2018-10-26 LAB — COMPREHENSIVE METABOLIC PANEL
ALT: 51 U/L — ABNORMAL HIGH (ref 0–35)
AST: 33 U/L (ref 0–37)
Albumin: 4.3 g/dL (ref 3.5–5.2)
Alkaline Phosphatase: 101 U/L (ref 39–117)
BUN: 11 mg/dL (ref 6–23)
CO2: 23 mEq/L (ref 19–32)
Calcium: 9.7 mg/dL (ref 8.4–10.5)
Chloride: 100 mEq/L (ref 96–112)
Creatinine, Ser: 0.57 mg/dL (ref 0.40–1.20)
GFR: 122.26 mL/min (ref >60.00)
Glucose, Bld: 170 mg/dL — ABNORMAL HIGH (ref 70–99)
Potassium: 4 mEq/L (ref 3.5–5.1)
Sodium: 136 mEq/L (ref 135–145)
Total Bilirubin: 0.4 mg/dL (ref 0.2–1.2)
Total Protein: 6.7 g/dL (ref 6.0–8.3)

## 2018-10-26 LAB — LIPID PANEL
Cholesterol: 182 mg/dL (ref 0–200)
HDL: 42.6 mg/dL (ref >39.00)
NonHDL: 139.83
Total CHOL/HDL Ratio: 4
Triglycerides: 383 mg/dL — ABNORMAL HIGH (ref 0.0–149.0)
VLDL: 76.6 mg/dL — ABNORMAL HIGH (ref 0.0–40.0)

## 2018-10-26 LAB — LDL CHOLESTEROL, DIRECT: Direct LDL: 109 mg/dL

## 2018-10-26 LAB — HEMOGLOBIN A1C: Hgb A1c MFr Bld: 7 % — ABNORMAL HIGH (ref 4.6–6.5)

## 2018-10-26 LAB — TSH: TSH: 1.29 u[IU]/mL (ref 0.35–4.50)

## 2018-10-27 ENCOUNTER — Encounter: Payer: Self-pay | Admitting: Family

## 2018-11-02 DIAGNOSIS — H52221 Regular astigmatism, right eye: Secondary | ICD-10-CM | POA: Diagnosis not present

## 2018-11-02 DIAGNOSIS — H35033 Hypertensive retinopathy, bilateral: Secondary | ICD-10-CM | POA: Diagnosis not present

## 2018-11-02 DIAGNOSIS — H5213 Myopia, bilateral: Secondary | ICD-10-CM | POA: Diagnosis not present

## 2018-11-02 DIAGNOSIS — E119 Type 2 diabetes mellitus without complications: Secondary | ICD-10-CM | POA: Diagnosis not present

## 2018-11-02 LAB — HM DIABETES EYE EXAM

## 2018-11-10 MED FILL — traZODone HCL 50 MG TABS: 50 | 45 days supply | Qty: 45 | Fill #1

## 2018-11-10 MED FILL — METOPROLOL SUCCINATE ER 25: 25 | 90 days supply | Qty: 90 | Fill #1

## 2018-11-10 MED FILL — LEVOTHYROXINE 50 MCG TABLET: 50 | 90 days supply | Qty: 90 | Fill #1

## 2018-11-20 MED FILL — HYDROCHLOROTHIAZIDE 25 MG T: 25 | 90 days supply | Qty: 90 | Fill #1

## 2019-01-02 ENCOUNTER — Other Ambulatory Visit: Payer: Self-pay | Admitting: Family

## 2019-01-03 MED FILL — traZODone HCL 50 MG TABS: 50 | 45 days supply | Qty: 45 | Fill #0

## 2019-01-18 MED FILL — OMEPRAZOLE 20 MG CAP: 20 | 90 days supply | Qty: 90 | Fill #1

## 2019-01-18 MED FILL — SPIRONOLACTONE 50 MG TABLET: 50 | 90 days supply | Qty: 90 | Fill #1

## 2019-01-26 ENCOUNTER — Encounter: Payer: Self-pay | Admitting: Women's Health

## 2019-01-27 ENCOUNTER — Other Ambulatory Visit: Payer: Self-pay | Admitting: Women's Health

## 2019-01-27 MED ORDER — MEDROXYPROGESTERONE ACETATE 10 MG PO TABS: 10.0000 mg | ORAL_TABLET | Freq: Every day | ORAL | 1 refills | Status: DC

## 2019-01-29 ENCOUNTER — Other Ambulatory Visit: Payer: Self-pay | Admitting: Women's Health

## 2019-01-29 MED FILL — MEDROXYPROGESTERONE 10 MG T: 10 | 5 days supply | Qty: 5 | Fill #0

## 2019-02-05 ENCOUNTER — Other Ambulatory Visit: Payer: Self-pay | Admitting: Family

## 2019-02-05 MED FILL — LEVOTHYROXINE 50 MCG TABLET: 50 | 30 days supply | Qty: 30 | Fill #0

## 2019-02-05 MED FILL — metFORMIN HCL 1000 MG TABS: 1000 | 90 days supply | Qty: 180 | Fill #1

## 2019-02-12 ENCOUNTER — Other Ambulatory Visit: Payer: Self-pay | Admitting: Nurse Practitioner

## 2019-02-12 DIAGNOSIS — D134 Benign neoplasm of liver: Secondary | ICD-10-CM

## 2019-02-23 MED FILL — traZODone HCL 50 MG TABS: 50 | 45 days supply | Qty: 45 | Fill #1

## 2019-02-27 DIAGNOSIS — K760 Fatty (change of) liver, not elsewhere classified: Secondary | ICD-10-CM | POA: Diagnosis not present

## 2019-03-07 ENCOUNTER — Other Ambulatory Visit: Payer: Self-pay | Admitting: Family

## 2019-03-08 ENCOUNTER — Ambulatory Visit
Admission: RE | Admit: 2019-03-08 | Discharge: 2019-03-08 | Disposition: A | Discharge status: Home / Self Care | Payer: 59 | Source: Ambulatory Visit | Attending: Nurse Practitioner | Admitting: Nurse Practitioner

## 2019-03-08 ENCOUNTER — Other Ambulatory Visit: Payer: Self-pay

## 2019-03-08 ENCOUNTER — Encounter: Payer: Self-pay | Admitting: Family

## 2019-03-08 DIAGNOSIS — D134 Benign neoplasm of liver: Secondary | ICD-10-CM | POA: Diagnosis not present

## 2019-03-08 MED ORDER — GADOXETATE DISODIUM 0.25 MMOL/ML IV SOLN
10.0000 mL | Freq: Once | INTRAVENOUS | Status: AC | PRN
  Administered 2019-03-08: 10 mL via INTRAVENOUS

## 2019-03-09 MED FILL — LEVOTHYROXINE 50 MCG TABLET: 50 | 30 days supply | Qty: 30 | Fill #1

## 2019-03-09 MED FILL — METOPROLOL SUCCINATE ER 25: 25 | 90 days supply | Qty: 90 | Fill #0

## 2019-03-29 DIAGNOSIS — K760 Fatty (change of) liver, not elsewhere classified: Secondary | ICD-10-CM | POA: Diagnosis not present

## 2019-03-29 DIAGNOSIS — D134 Benign neoplasm of liver: Secondary | ICD-10-CM | POA: Diagnosis not present

## 2019-04-03 ENCOUNTER — Other Ambulatory Visit: Payer: Self-pay | Admitting: Family

## 2019-04-03 MED FILL — traZODone HCL 50 MG TABS: 50 | 45 days supply | Qty: 45 | Fill #0

## 2019-04-03 MED FILL — HYDROCHLOROTHIAZIDE 25 MG T: 25 | 30 days supply | Qty: 30 | Fill #0

## 2019-04-24 ENCOUNTER — Ambulatory Visit: Payer: 59 | Admitting: Family

## 2019-05-01 ENCOUNTER — Ambulatory Visit: Payer: 59 | Admitting: Family

## 2019-05-03 ENCOUNTER — Other Ambulatory Visit: Payer: Self-pay | Admitting: Family

## 2019-05-03 MED FILL — ATORVASTATIN 40 MG TABLET: 40 | 90 days supply | Qty: 90 | Fill #1

## 2019-05-03 MED FILL — OMEPRAZOLE 20 MG CAP: 20 | 90 days supply | Qty: 90 | Fill #0

## 2019-05-03 NOTE — Telephone Encounter (Signed)
Last OV 10/13/18 Last refill 10/13/18 #90/1 Next OV 05/08/19

## 2019-05-08 ENCOUNTER — Ambulatory Visit (INDEPENDENT_AMBULATORY_CARE_PROVIDER_SITE_OTHER): Payer: 59 | Admitting: Family

## 2019-05-08 ENCOUNTER — Encounter: Payer: Self-pay | Admitting: Family

## 2019-05-08 ENCOUNTER — Other Ambulatory Visit: Payer: Self-pay

## 2019-05-08 ENCOUNTER — Telehealth: Payer: Self-pay | Admitting: Family

## 2019-05-08 VITALS — BP 118/68 | HR 96 | Temp 98.2°F | Ht 67.0 in | Wt 262.0 lb

## 2019-05-08 DIAGNOSIS — E785 Hyperlipidemia, unspecified: Secondary | ICD-10-CM

## 2019-05-08 DIAGNOSIS — D649 Anemia, unspecified: Secondary | ICD-10-CM | POA: Diagnosis not present

## 2019-05-08 DIAGNOSIS — L680 Hirsutism: Secondary | ICD-10-CM

## 2019-05-08 DIAGNOSIS — E119 Type 2 diabetes mellitus without complications: Secondary | ICD-10-CM

## 2019-05-08 MED ORDER — SPIRONOLACTONE 50 MG PO TABS: 50.0000 mg | ORAL_TABLET | Freq: Every day | ORAL | 1 refills | Status: DC

## 2019-05-08 MED ORDER — METOPROLOL SUCCINATE ER 25 MG PO TB24: 25.0000 mg | ORAL_TABLET | Freq: Every day | ORAL | 1 refills | Status: DC

## 2019-05-08 MED ORDER — LEVOTHYROXINE SODIUM 50 MCG PO TABS: ORAL_TABLET | ORAL | 1 refills | Status: DC

## 2019-05-08 MED ORDER — METFORMIN HCL 1000 MG PO TABS: 1000.0000 mg | ORAL_TABLET | Freq: Two times a day (BID) | ORAL | 1 refills | Status: DC

## 2019-05-08 MED ORDER — HYDROCHLOROTHIAZIDE 25 MG PO TABS: 25.0000 mg | ORAL_TABLET | Freq: Every day | ORAL | 1 refills | Status: DC

## 2019-05-08 MED ORDER — ATORVASTATIN CALCIUM 40 MG PO TABS: 40.0000 mg | ORAL_TABLET | Freq: Every day | ORAL | 1 refills | Status: DC

## 2019-05-08 MED FILL — HYDROCHLOROTHIAZIDE 25 MG T: 25 | 90 days supply | Qty: 90 | Fill #0

## 2019-05-08 MED FILL — metFORMIN HCL 1000 MG TABS: 1000 | 90 days supply | Qty: 180 | Fill #0

## 2019-05-08 MED FILL — SPIRONOLACTONE 50 MG TABLET: 50 | 90 days supply | Qty: 90 | Fill #0

## 2019-05-08 MED FILL — LEVOTHYROXINE 50 MCG TABLET: 50 | 90 days supply | Qty: 90 | Fill #0

## 2019-05-08 NOTE — Telephone Encounter (Signed)
Could you please contact [facility] in [facility] (off of [address]) [phone], to request a copy of diabetic eye exam.

## 2019-05-08 NOTE — Progress Notes (Signed)
Virtual Visit via Video Note  I connected with Kristy Chung on 05/08/19 at  4:20 PM EST  verified that I am speaking with the correct person using two identifiers. She was unable to participate in a video visit because she was in her car so we transitioned to a telephone visit.  Location: Patient: car Provider: work   I discussed the limitations of evaluation and management by telemedicine and the availability of in person appointments. The patient expressed understanding and agreed to proceed.  History of Present Illness:  Patient is a 33 yr old female who presents today for follow up.  DM2- maintained on metformin. She has not been checking sugars recently.   Lab Results  Component Value Date   HGBA1C 7.0 (H) 10/25/2018   HGBA1C 6.7 (H) 04/12/2018   HGBA1C 6.9 (H) 09/16/2017   Lab Results  Component Value Date   MICROALBUR <0.7 10/25/2018   LDLCALC 104 (H) 09/16/2017   CREATININE 0.57 10/25/2018   Hyperlipidemia-  Lab Results  Component Value Date   CHOL 182 10/25/2018   HDL 42.60 10/25/2018   LDLCALC 104 (H) 09/16/2017   LDLDIRECT 109.0 10/25/2018   TRIG 383.0 (H) 10/25/2018   CHOLHDL 4 10/25/2018   Hypothyroid- maintained on synthroid.  Reports feeling well on current dose of synthroid.   Wt Readings from Last 3 Encounters:  05/08/19 262 lb (118.8 kg)  10/13/18 270 lb (122.5 kg)  06/13/18 277 lb (125.6 kg)     Lab Results  Component Value Date   TSH 1.29 10/25/2018   HTN- maintained on hydrochlorothiazide, toprol xl, aldactone.  BP Readings from Last 3 Encounters:  05/08/19 118/68  10/13/18 119/86  06/13/18 124/82    Fatty liver/hepatic adenomas- being followed by Roosevelt Locks NP Past Medical History:  Diagnosis Date  . Allergy   . History of chicken pox      Social History   Socioeconomic History  . Marital status: Single    Spouse name: Not on file  . Number of children: Not on file  . Years of education: Not on file  . Highest  education level: Not on file  Occupational History  . Not on file  Tobacco Use  . Smoking status: Never Smoker  . Smokeless tobacco: Never Used  Substance and Sexual Activity  . Alcohol use: Yes    Alcohol/week: 0.0 standard drinks    Comment: occasional  . Drug use: No  . Sexual activity: Not Currently    Comment: INTERCOURSE AGE 45, SEXUAL PARTNERS 5  Other Topics Concern  . Not on file  Social History Narrative   Enjoys family friends, sleeping, reading, relaxing   2 cats "Bruce and Bella"   No children   Single   RN in [employer]   Her family is in [place]   Social Determinants of Radio broadcast assistant Strain:   . Difficulty of Paying Living Expenses: Not on file  Food Insecurity:   . Worried About Charity fundraiser in the Last Year: Not on file  . Ran Out of Food in the Last Year: Not on file  Transportation Needs:   . Lack of Transportation (Medical): Not on file  . Lack of Transportation (Non-Medical): Not on file  Physical Activity:   . Days of Exercise per Week: Not on file  . Minutes of Exercise per Session: Not on file  Stress:   . Feeling of Stress : Not on file  Social Connections:   . Frequency  of Communication with Friends and Family: Not on file  . Frequency of Social Gatherings with Friends and Family: Not on file  . Attends Religious Services: Not on file  . Active Member of Clubs or Organizations: Not on file  . Attends Archivist Meetings: Not on file  . Marital Status: Not on file  Intimate Partner Violence:   . Fear of Current or Ex-Partner: Not on file  . Emotionally Abused: Not on file  . Physically Abused: Not on file  . Sexually Abused: Not on file    Past Surgical History:  Procedure Laterality Date  . WISDOM TOOTH EXTRACTION  2005    Family History  Problem Relation Age of Onset  . Hyperlipidemia Mother   . Depression Mother   . Hyperlipidemia Father   . Hypertension Father   . Hypothyroidism Maternal Grandmother    . Heart disease Maternal Grandfather        MI, stent, CAD  . Hypertension Maternal Grandfather   . Hyperlipidemia Maternal Grandfather   . COPD Paternal Grandmother   . Hypertension Paternal Grandmother   . Hyperlipidemia Paternal Grandmother   . Heart disease Paternal Grandmother   . AAA (abdominal aortic aneurysm) Paternal Grandfather   . Dementia Paternal Grandfather   . Heart disease Paternal Grandfather     Allergies  Allergen Reactions  . Amlodipine     flushing  . Other     CINNAMON GUM--blisters.    Current Outpatient Medications on File Prior to Visit  Medication Sig Dispense Refill  . atorvastatin (LIPITOR) 40 MG tablet Take 1 tablet (40 mg total) by mouth daily. 90 tablet 1  . glucose blood (FREESTYLE TEST STRIPS) test strip Use as instructed (Patient taking differently: Use as instructed to check blood sugar 1 to 2 times daily.) 100 each 12  . glucose monitoring kit (FREESTYLE) monitoring kit 1 each by Does not apply route as needed for other. 1 each 0  . hydrochlorothiazide (HYDRODIURIL) 25 MG tablet TAKE 1 TABLET BY MOUTH ONCE DAILY 30 tablet 0  . Lancets (FREESTYLE) lancets Use as instructed 100 each 12  . levothyroxine (SYNTHROID) 50 MCG tablet TAKE 1 TABLET BY MOUTH ONCE DAILY BEFORE BREAKFAST 30 tablet 1  . medroxyPROGESTERone (PROVERA) 10 MG tablet Take 1 tablet (10 mg total) by mouth daily. 5 tablet 1  . metFORMIN (GLUCOPHAGE) 1000 MG tablet Take 1 tablet (1,000 mg total) by mouth 2 (two) times daily with a meal. 180 tablet 1  . metoprolol succinate (TOPROL-XL) 25 MG 24 hr tablet TAKE ONE TABLET BY MOUTH ONCE DAILY 90 tablet 0  . Multiple Vitamins-Iron (MULTI-VITAMIN/IRON) TABS Take 1 tablet by mouth daily.  0  . Omega-3 Fatty Acids (FISH OIL) 1000 MG CAPS Take 2,000 mg by mouth 2 (two) times daily.    Marland Kitchen omeprazole (PRILOSEC) 20 MG capsule TAKE 1 CAPSULE (20 MG TOTAL) BY MOUTH DAILY. 90 capsule 1  . spironolactone (ALDACTONE) 50 MG tablet Take 1 tablet (50  mg total) by mouth daily. 90 tablet 1  . traZODone (DESYREL) 50 MG tablet TAKE 1/2 - 1 TABLET BY MOUTH AT BEDTIME AS NEEDED FOR SLEEP 45 tablet 0  . vitamin B-12 (CYANOCOBALAMIN) 1000 MCG tablet Take 1,000 mcg by mouth daily.     No current facility-administered medications on file prior to visit.    BP 118/68 (Patient Position: Sitting)   Pulse 96   Temp 98.2 F (36.8 C) (Oral)   Ht 5' 7"  (1.702 m)   Wt  262 lb (118.8 kg)   SpO2 99%   BMI 41.04 kg/m      Observations/Objective:   Gen: Awake, alert, no acute distress Resp: Breathing is even and non-labored Psych: calm/pleasant demeanor Neuro: Alert and Oriented x 3, + facial symmetry, speech is clear.   Assessment and Plan:  DM2- control unknown. Obtain follow up A1C.  Hyperlipidemia- obtain follow up FLP, continue statin.  Hypothyroid- obtain tsh. Clinically stable on synthroid.   HTN- bp stable on current meds. Continue same. Obtain follow up CMET.   Iron deficiency anemia- obtain cbc an iron studies.   11 minutes spent on today's call.  Follow Up Instructions:    I discussed the assessment and treatment plan with the patient. The patient was provided an opportunity to ask questions and all were answered. The patient agreed with the plan and demonstrated an understanding of the instructions.   The patient was advised to call back or seek an in-person evaluation if the symptoms worsen or if the condition fails to improve as anticipated.  Nance Pear, NP

## 2019-05-09 NOTE — Telephone Encounter (Signed)
Medical records release form faxed to [facility] [place].

## 2019-05-29 ENCOUNTER — Other Ambulatory Visit: Payer: Self-pay | Admitting: Family

## 2019-05-29 MED FILL — traZODone HCL 50 MG TABS: 50 | 45 days supply | Qty: 45 | Fill #0

## 2019-06-14 ENCOUNTER — Other Ambulatory Visit: Payer: 59

## 2019-06-18 ENCOUNTER — Other Ambulatory Visit: Payer: Self-pay

## 2019-06-19 ENCOUNTER — Other Ambulatory Visit: Payer: Self-pay

## 2019-06-19 ENCOUNTER — Other Ambulatory Visit (INDEPENDENT_AMBULATORY_CARE_PROVIDER_SITE_OTHER): Payer: 59

## 2019-06-19 ENCOUNTER — Ambulatory Visit (INDEPENDENT_AMBULATORY_CARE_PROVIDER_SITE_OTHER): Payer: 59 | Admitting: Women's Health

## 2019-06-19 ENCOUNTER — Encounter: Payer: Self-pay | Admitting: Women's Health

## 2019-06-19 VITALS — BP 128/80 | Ht 67.0 in | Wt 272.0 lb

## 2019-06-19 DIAGNOSIS — E119 Type 2 diabetes mellitus without complications: Secondary | ICD-10-CM

## 2019-06-19 DIAGNOSIS — E785 Hyperlipidemia, unspecified: Secondary | ICD-10-CM | POA: Diagnosis not present

## 2019-06-19 DIAGNOSIS — D649 Anemia, unspecified: Secondary | ICD-10-CM

## 2019-06-19 DIAGNOSIS — Z01419 Encounter for gynecological examination (general) (routine) without abnormal findings: Secondary | ICD-10-CM

## 2019-06-19 LAB — LIPID PANEL
Cholesterol: 194 mg/dL (ref 0–200)
HDL: 45.4 mg/dL (ref >39.00)
NonHDL: 148.41
Total CHOL/HDL Ratio: 4
Triglycerides: 324 mg/dL — ABNORMAL HIGH (ref 0.0–149.0)
VLDL: 64.8 mg/dL — ABNORMAL HIGH (ref 0.0–40.0)

## 2019-06-19 LAB — CBC WITH DIFFERENTIAL/PLATELET
Basophils Absolute: 0.1 10*3/uL (ref 0.0–0.1)
Basophils Relative: 0.8 % (ref 0.0–3.0)
Eosinophils Absolute: 0.2 10*3/uL (ref 0.0–0.7)
Eosinophils Relative: 1.8 % (ref 0.0–5.0)
HCT: 32.6 % — ABNORMAL LOW (ref 36.0–46.0)
Hemoglobin: 10 g/dL — ABNORMAL LOW (ref 12.0–15.0)
Lymphocytes Relative: 30.1 % (ref 12.0–46.0)
Lymphs Abs: 3 10*3/uL (ref 0.7–4.0)
MCHC: 30.5 g/dL (ref 30.0–36.0)
MCV: 69.1 fl — ABNORMAL LOW (ref 78.0–100.0)
Monocytes Absolute: 0.3 10*3/uL (ref 0.1–1.0)
Monocytes Relative: 3.4 % (ref 3.0–12.0)
Neutro Abs: 6.4 10*3/uL (ref 1.4–7.7)
Neutrophils Relative %: 63.9 % (ref 43.0–77.0)
Platelets: 374 10*3/uL (ref 150.0–400.0)
RBC: 4.72 Mil/uL (ref 3.87–5.11)
RDW: 17 % — ABNORMAL HIGH (ref 11.5–15.5)
WBC: 10 10*3/uL (ref 4.0–10.5)

## 2019-06-19 LAB — COMPREHENSIVE METABOLIC PANEL
ALT: 45 U/L — ABNORMAL HIGH (ref 0–35)
AST: 28 U/L (ref 0–37)
Albumin: 4.2 g/dL (ref 3.5–5.2)
Alkaline Phosphatase: 109 U/L (ref 39–117)
BUN: 10 mg/dL (ref 6–23)
CO2: 24 mEq/L (ref 19–32)
Calcium: 9.3 mg/dL (ref 8.4–10.5)
Chloride: 101 mEq/L (ref 96–112)
Creatinine, Ser: 0.6 mg/dL (ref 0.40–1.20)
GFR: 114.78 mL/min (ref >60.00)
Glucose, Bld: 168 mg/dL — ABNORMAL HIGH (ref 70–99)
Potassium: 3.7 mEq/L (ref 3.5–5.1)
Sodium: 135 mEq/L (ref 135–145)
Total Bilirubin: 0.4 mg/dL (ref 0.2–1.2)
Total Protein: 6.9 g/dL (ref 6.0–8.3)

## 2019-06-19 LAB — LDL CHOLESTEROL, DIRECT: Direct LDL: 125 mg/dL

## 2019-06-19 LAB — FERRITIN: Ferritin: 5.9 ng/mL — ABNORMAL LOW (ref 10.0–291.0)

## 2019-06-19 LAB — IRON: Iron: 26 ug/dL — ABNORMAL LOW (ref 42–145)

## 2019-06-19 LAB — HEMOGLOBIN A1C: Hgb A1c MFr Bld: 6.7 % — ABNORMAL HIGH (ref 4.6–6.5)

## 2019-06-19 MED ORDER — MEDROXYPROGESTERONE ACETATE 10 MG PO TABS: ORAL_TABLET | ORAL | 1 refills | Status: DC

## 2019-06-19 MED FILL — MEDROXYPROGESTERONE 10 MG T: 10 | 90 days supply | Qty: 15 | Fill #0

## 2019-06-19 NOTE — Patient Instructions (Signed)
Good to see you today   Health Maintenance, Female Adopting a healthy lifestyle and getting preventive care are important in promoting health and wellness. Ask your health care provider about:  The right schedule for you to have regular tests and exams.  Things you can do on your own to prevent diseases and keep yourself healthy. What should I know about diet, weight, and exercise? Eat a healthy diet   Eat a diet that includes plenty of vegetables, fruits, low-fat dairy products, and lean protein.  Do not eat a lot of foods that are high in solid fats, added sugars, or sodium. Maintain a healthy weight Body mass index (BMI) is used to identify weight problems. It estimates body fat based on height and weight. Your health care provider can help determine your BMI and help you achieve or maintain a healthy weight. Get regular exercise Get regular exercise. This is one of the most important things you can do for your health. Most adults should:  Exercise for at least 150 minutes each week. The exercise should increase your heart rate and make you sweat (moderate-intensity exercise).  Do strengthening exercises at least twice a week. This is in addition to the moderate-intensity exercise.  Spend less time sitting. Even light physical activity can be beneficial. Watch cholesterol and blood lipids Have your blood tested for lipids and cholesterol at 34 years of age, then have this test every 5 years. Have your cholesterol levels checked more often if:  Your lipid or cholesterol levels are high.  You are older than 34 years of age.  You are at high risk for heart disease. What should I know about cancer screening? Depending on your health history and family history, you may need to have cancer screening at various ages. This may include screening for:  Breast cancer.  Cervical cancer.  Colorectal cancer.  Skin cancer.  Lung cancer. What should I know about heart disease,  diabetes, and high blood pressure? Blood pressure and heart disease  High blood pressure causes heart disease and increases the risk of stroke. This is more likely to develop in people who have high blood pressure readings, are of African descent, or are overweight.  Have your blood pressure checked: ? Every 3-5 years if you are 18-39 years of age. ? Every year if you are 40 years old or older. Diabetes Have regular diabetes screenings. This checks your fasting blood sugar level. Have the screening done:  Once every three years after age 40 if you are at a normal weight and have a low risk for diabetes.  More often and at a younger age if you are overweight or have a high risk for diabetes. What should I know about preventing infection? Hepatitis B If you have a higher risk for hepatitis B, you should be screened for this virus. Talk with your health care provider to find out if you are at risk for hepatitis B infection. Hepatitis C Testing is recommended for:  Everyone born from 1945 through 1965.  Anyone with known risk factors for hepatitis C. Sexually transmitted infections (STIs)  Get screened for STIs, including gonorrhea and chlamydia, if: ? You are sexually active and are younger than 34 years of age. ? You are older than 34 years of age and your health care provider tells you that you are at risk for this type of infection. ? Your sexual activity has changed since you were last screened, and you are at increased risk for chlamydia or   gonorrhea. Ask your health care provider if you are at risk.  Ask your health care provider about whether you are at high risk for HIV. Your health care provider may recommend a prescription medicine to help prevent HIV infection. If you choose to take medicine to prevent HIV, you should first get tested for HIV. You should then be tested every 3 months for as long as you are taking the medicine. Pregnancy  If you are about to stop having your  period (premenopausal) and you may become pregnant, seek counseling before you get pregnant.  Take 400 to 800 micrograms (mcg) of folic acid every day if you become pregnant.  Ask for birth control (contraception) if you want to prevent pregnancy. Osteoporosis and menopause Osteoporosis is a disease in which the bones lose minerals and strength with aging. This can result in bone fractures. If you are 65 years old or older, or if you are at risk for osteoporosis and fractures, ask your health care provider if you should:  Be screened for bone loss.  Take a calcium or vitamin D supplement to lower your risk of fractures.  Be given hormone replacement therapy (HRT) to treat symptoms of menopause. Follow these instructions at home: Lifestyle  Do not use any products that contain nicotine or tobacco, such as cigarettes, e-cigarettes, and chewing tobacco. If you need help quitting, ask your health care provider.  Do not use street drugs.  Do not share needles.  Ask your health care provider for help if you need support or information about quitting drugs. Alcohol use  Do not drink alcohol if: ? Your health care provider tells you not to drink. ? You are pregnant, may be pregnant, or are planning to become pregnant.  If you drink alcohol: ? Limit how much you use to 0-1 drink a day. ? Limit intake if you are breastfeeding.  Be aware of how much alcohol is in your drink. In the [place] one drink equals one 12 oz bottle of beer (355 mL), one 5 oz glass of wine (148 mL), or one 1 oz glass of hard liquor (44 mL). General instructions  Schedule regular health, dental, and eye exams.  Stay current with your vaccines.  Tell your health care provider if: ? You often feel depressed. ? You have ever been abused or do not feel safe at home. Summary  Adopting a healthy lifestyle and getting preventive care are important in promoting health and wellness.  Follow your health care provider's  instructions about healthy diet, exercising, and getting tested or screened for diseases.  Follow your health care provider's instructions on monitoring your cholesterol and blood pressure. This information is not intended to replace advice given to you by your health care provider. Make sure you discuss any questions you have with your health care provider. Document Revised: 04/26/2018 Document Reviewed: 04/26/2018 Elsevier Patient Education  2020 Elsevier Inc.  

## 2019-06-19 NOTE — Progress Notes (Signed)
Kristy Chung [birth year] [id number]    History:    Presents for annual exam.  Monthly cycle, history of irregular cycles used Provera 1 time this past year in September to induce cycle.  Not sexually active in greater than 2 years.  Normal Pap history.  Gardasil series completed.  Primary care manages hypertension, hypothyroidism, diabetes and hypercholesterolemia.  History of a liver adenoma decreased in size after discontinuing  OCs.  Past medical history, past surgical history, family history and social history were all reviewed and documented in the EPIC chart.  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  ROS:  A ROS was performed and pertinent positives and negatives are included.  Exam:  Vitals:   06/19/19 1011  BP: 128/80  Weight: 272 lb (123.4 kg)  Height: 5\' 7"  (1.702 m)   Body mass index is 42.6 kg/m.   General appearance:  Normal Thyroid:  Symmetrical, normal in size, without palpable masses or nodularity. Respiratory  Auscultation:  Clear without wheezing or rhonchi Cardiovascular  Auscultation:  Regular rate, without rubs, murmurs or gallops  Edema/varicosities:  Not grossly evident Abdominal  Soft,nontender, without masses, guarding or rebound.  Liver/spleen:  No organomegaly noted  Hernia:  None appreciated  Skin  Inspection:  Grossly normal   Breasts: Examined lying and sitting.     Right: Without masses, retractions, discharge or axillary adenopathy.     Left: Without masses, retractions, discharge or axillary adenopathy. Gentitourinary   Inguinal/mons:  Normal without inguinal adenopathy  External genitalia:  Normal  BUS/Urethra/Skene's glands:  Normal  Vagina:  Normal  Cervix:  Normal  Uterus:  normal in size, shape and contour.  Midline and mobile  Adnexa/parametria:     Rt: Without masses or tenderness.   Lt: Without masses or tenderness.  Anus and perineum: Normal  Digital rectal exam: Normal sphincter tone without palpated masses or  tenderness  Assessment/Plan:  34 y.o. S WF G0 for annual exam with no complaints.  Monthly cycle/not sexually active Hypertension, hypothyroidism, diabetes, hypercholesteremia-primary care manages labs and meds Obesity  Plan: Declines contraception, contemplating donor sperm insemination.  Refill of Provera 10 mg to take for 5 days if cycles space greater than 60 days.  Aware if becomes sexually active to check a home UPT prior.  Reviewed it is done with fertility management.  Reviewed importance of weight loss prior.  SBEs, screening mammogram at 40, calcium rich foods, continue MVI daily.  Aware of need to increase exercise and decrease calories/carbs, weight watchers encouraged or [facility] weight management program.  Pap normal 2020, new screening guidelines reviewed.    [place], 2:47 PM 06/19/2019

## 2019-06-20 ENCOUNTER — Encounter: Payer: Self-pay | Admitting: Family

## 2019-06-20 ENCOUNTER — Telehealth: Payer: Self-pay | Admitting: Family

## 2019-06-20 DIAGNOSIS — D509 Iron deficiency anemia, unspecified: Secondary | ICD-10-CM

## 2019-06-20 HISTORY — DX: Iron deficiency anemia, unspecified: D50.9

## 2019-06-20 MED ORDER — IRON 325 (65 FE) MG PO TABS: ORAL_TABLET | ORAL | 0 refills | Status: DC

## 2019-06-20 MED FILL — FERROUS SULFATE 325 (65 FE): 325 (65 FE) | 50 days supply | Qty: 100 | Fill #0

## 2019-06-20 NOTE — Telephone Encounter (Signed)
Also cholesterol above goal. Has she been taking lipitor nightly?  If so, I would like her to increase lipitor from 40mg  to 80mg  once daily.

## 2019-06-20 NOTE — Telephone Encounter (Signed)
Lab work shows iron deficiency anemia. Please add iron 325 mg PO bid.  Repeat CBC, serum iron, ferritin (dx iron def anemia) in 3 months.  Sugar is a bit better.  Trigs still elevated but improved. Continue to work on diet.

## 2019-06-21 ENCOUNTER — Other Ambulatory Visit: Payer: Self-pay

## 2019-06-21 MED ORDER — ATORVASTATIN CALCIUM 80 MG PO TABS: 80.0000 mg | ORAL_TABLET | Freq: Every day | ORAL | 0 refills | Status: DC

## 2019-06-21 MED FILL — METOPROLOL SUCCINATE ER 25: 25 | 90 days supply | Qty: 90 | Fill #0

## 2019-06-21 MED FILL — ATORVASTATIN 80 MG TABLET: 80 | 90 days supply | Qty: 90 | Fill #0

## 2019-06-21 NOTE — Telephone Encounter (Signed)
Lvm for patient to call about Results.

## 2019-06-21 NOTE — Telephone Encounter (Signed)
Let's see how she responds to oral iron first. If still low in 3 months then I typically refer to hematology for IV iron.

## 2019-06-21 NOTE — Telephone Encounter (Signed)
Patient advised of results and provider's advise. She verbalized understanding, will start to take her 40 mg lipitor 2 a day.   New rx for lipitor 80 mg sent at patient's request.   Patient asking if she can get an iv infusion of ferritin at work?

## 2019-06-22 NOTE — Telephone Encounter (Signed)
Patient advised of provider's recommendations. She verbalized understanding and will start iron 325 bid

## 2019-07-09 ENCOUNTER — Other Ambulatory Visit: Payer: Self-pay | Admitting: Family

## 2019-07-09 MED FILL — traZODone HCL 50 MG TABS: 50 | 45 days supply | Qty: 45 | Fill #0

## 2019-07-30 MED FILL — OMEPRAZOLE 20 MG CAP: 20 | 90 days supply | Qty: 90 | Fill #1

## 2019-08-27 ENCOUNTER — Other Ambulatory Visit: Payer: Self-pay | Admitting: Family

## 2019-08-27 MED FILL — SPIRONOLACTONE 50 MG TABLET: 50 | 90 days supply | Qty: 90 | Fill #1

## 2019-08-27 MED FILL — traZODone HCL 50 MG TABS: 50 | 45 days supply | Qty: 45 | Fill #0

## 2019-08-27 MED FILL — LEVOTHYROXINE 50 MCG TABLET: 50 | 90 days supply | Qty: 90 | Fill #1

## 2019-08-27 MED FILL — METFORMIN HCL 1000 MG TABS: 1000 | 90 days supply | Qty: 180 | Fill #1

## 2019-08-27 MED FILL — HYDROCHLOROTHIAZIDE 25 MG T: 25 | 90 days supply | Qty: 90 | Fill #1

## 2019-10-08 ENCOUNTER — Other Ambulatory Visit: Payer: Self-pay | Admitting: Family

## 2019-10-08 MED FILL — METOPROLOL SUCCINATE ER 25: 25 | 90 days supply | Qty: 90 | Fill #1

## 2019-10-09 MED FILL — traZODone HCL 50 MG TABS: 50 | 45 days supply | Qty: 45 | Fill #0

## 2019-10-29 ENCOUNTER — Other Ambulatory Visit: Payer: Self-pay | Admitting: Family

## 2019-10-30 MED FILL — ATORVASTATIN 80 MG TABLET: 80 | 90 days supply | Qty: 90 | Fill #0

## 2019-10-30 MED FILL — OMEPRAZOLE DR 20 MG CAPSULE: 20 | 90 days supply | Qty: 90 | Fill #0

## 2019-11-21 ENCOUNTER — Encounter: Payer: 59 | Admitting: Family

## 2019-11-21 ENCOUNTER — Other Ambulatory Visit: Payer: Self-pay

## 2019-11-22 DIAGNOSIS — H52221 Regular astigmatism, right eye: Secondary | ICD-10-CM | POA: Diagnosis not present

## 2019-11-22 DIAGNOSIS — E119 Type 2 diabetes mellitus without complications: Secondary | ICD-10-CM | POA: Diagnosis not present

## 2019-11-22 LAB — HM DIABETES EYE EXAM

## 2019-11-28 ENCOUNTER — Telehealth: Payer: Self-pay | Admitting: Family

## 2019-11-28 NOTE — Telephone Encounter (Signed)
Appointment on 11/21/2019   Patient states that she showed for appointment, Patient was told that she was late a had to reschedule. Patient also stated that she had to stand in line, which made her late.   Please Advise

## 2019-11-30 NOTE — Telephone Encounter (Signed)
Yes, please remove no-show fee.

## 2019-11-30 NOTE — Telephone Encounter (Signed)
Please Advise if we can remove No Show Fee for patient.

## 2019-12-01 ENCOUNTER — Other Ambulatory Visit: Payer: Self-pay | Admitting: Family

## 2019-12-01 MED FILL — traZODone HCL 50 MG TABS: 50 | 45 days supply | Qty: 45 | Fill #0

## 2019-12-01 MED FILL — METFORMIN HCL 1000 MG TABS: 1000 | 90 days supply | Qty: 180 | Fill #0

## 2019-12-17 ENCOUNTER — Other Ambulatory Visit: Payer: Self-pay | Admitting: Family

## 2019-12-17 MED ORDER — LEVOTHYROXINE SODIUM 50 MCG PO TABS: ORAL_TABLET | ORAL | 1 refills | Status: DC

## 2019-12-17 MED FILL — LEVOTHYROXINE SODIUM 50 MCG: 50 | 90 days supply | Qty: 90 | Fill #0

## 2019-12-17 NOTE — Telephone Encounter (Signed)
Medication refilled. Patient needs appt for further refills.

## 2019-12-17 NOTE — Telephone Encounter (Signed)
New message:   1.Medication Requested: levothyroxine (SYNTHROID) 50 MCG tablet 2. Pharmacy (Name, [place], [place]): [place], [place] - [place] 3. On Med List: yes  4. Last Visit with PCP: 06/19/19  5. Next visit date with PCP: 01/08/20   Pt states the pharmacy states we wouldn't refill this medication because the pt needed an appt. Pt has an appt on 01/08/20. Please advise.  Agent: Please be advised that RX refills may take up to 3 business days. We ask that you follow-up with your pharmacy.

## 2020-01-07 ENCOUNTER — Other Ambulatory Visit: Payer: Self-pay | Admitting: Family

## 2020-01-07 DIAGNOSIS — L680 Hirsutism: Secondary | ICD-10-CM

## 2020-01-07 MED FILL — SPIRONOLACTONE 50 MG TABS: 50 | 90 days supply | Qty: 90 | Fill #0

## 2020-01-08 ENCOUNTER — Other Ambulatory Visit: Payer: Self-pay

## 2020-01-08 ENCOUNTER — Ambulatory Visit (INDEPENDENT_AMBULATORY_CARE_PROVIDER_SITE_OTHER): Payer: 59 | Admitting: Family

## 2020-01-08 ENCOUNTER — Encounter: Payer: Self-pay | Admitting: Family

## 2020-01-08 ENCOUNTER — Other Ambulatory Visit: Payer: Self-pay | Admitting: Family

## 2020-01-08 VITALS — BP 123/83 | HR 80 | Temp 98.4°F | Resp 16 | Ht 66.0 in | Wt 262.0 lb

## 2020-01-08 DIAGNOSIS — D134 Benign neoplasm of liver: Secondary | ICD-10-CM | POA: Diagnosis not present

## 2020-01-08 DIAGNOSIS — I10 Essential (primary) hypertension: Secondary | ICD-10-CM

## 2020-01-08 DIAGNOSIS — E119 Type 2 diabetes mellitus without complications: Secondary | ICD-10-CM

## 2020-01-08 DIAGNOSIS — Z0000 Encounter for general adult medical examination without abnormal findings: Secondary | ICD-10-CM

## 2020-01-08 DIAGNOSIS — E039 Hypothyroidism, unspecified: Secondary | ICD-10-CM | POA: Diagnosis not present

## 2020-01-08 DIAGNOSIS — E785 Hyperlipidemia, unspecified: Secondary | ICD-10-CM | POA: Diagnosis not present

## 2020-01-08 DIAGNOSIS — D509 Iron deficiency anemia, unspecified: Secondary | ICD-10-CM

## 2020-01-08 MED ORDER — ATORVASTATIN CALCIUM 80 MG PO TABS: 80.0000 mg | ORAL_TABLET | Freq: Every day | ORAL | 0 refills | Status: DC

## 2020-01-08 MED ORDER — HYDROCHLOROTHIAZIDE 25 MG PO TABS: 25.0000 mg | ORAL_TABLET | Freq: Every day | ORAL | 1 refills | Status: DC

## 2020-01-08 MED ORDER — METOPROLOL SUCCINATE ER 25 MG PO TB24: 25.0000 mg | ORAL_TABLET | Freq: Every day | ORAL | 1 refills | Status: DC

## 2020-01-08 MED FILL — METOPROLOL SUCCINATE ER 25: 25 | 90 days supply | Qty: 90 | Fill #0

## 2020-01-08 MED FILL — HYDROCHLOROTHIAZIDE 25 MG T: 25 | 90 days supply | Qty: 90 | Fill #0

## 2020-01-08 NOTE — Patient Instructions (Addendum)
Please complete lab work prior to leaving.  Preventive Care 21-34 Years Old, Female Preventive care refers to visits with your health care provider and lifestyle choices that can promote health and wellness. This includes:  A yearly physical exam. This may also be called an annual well check.  Regular dental visits and eye exams.  Immunizations.  Screening for certain conditions.  Healthy lifestyle choices, such as eating a healthy diet, getting regular exercise, not using drugs or products that contain nicotine and tobacco, and limiting alcohol use. What can I expect for my preventive care visit? Physical exam Your health care provider will check your:  Height and weight. This may be used to calculate body mass index (BMI), which tells if you are at a healthy weight.  Heart rate and blood pressure.  Skin for abnormal spots. Counseling Your health care provider may ask you questions about your:  Alcohol, tobacco, and drug use.  Emotional well-being.  Home and relationship well-being.  Sexual activity.  Eating habits.  Work and work environment.  Method of birth control.  Menstrual cycle.  Pregnancy history. What immunizations do I need?  Influenza (flu) vaccine  This is recommended every year. Tetanus, diphtheria, and pertussis (Tdap) vaccine  You may need a Td booster every 10 years. Varicella (chickenpox) vaccine  You may need this if you have not been vaccinated. Human papillomavirus (HPV) vaccine  If recommended by your health care provider, you may need three doses over 6 months. Measles, mumps, and rubella (MMR) vaccine  You may need at least one dose of MMR. You may also need a second dose. Meningococcal conjugate (MenACWY) vaccine  One dose is recommended if you are age 19-21 years and a first-year college student living in a residence hall, or if you have one of several medical conditions. You may also need additional booster doses. Pneumococcal  conjugate (PCV13) vaccine  You may need this if you have certain conditions and were not previously vaccinated. Pneumococcal polysaccharide (PPSV23) vaccine  You may need one or two doses if you smoke cigarettes or if you have certain conditions. Hepatitis A vaccine  You may need this if you have certain conditions or if you travel or work in places where you may be exposed to hepatitis A. Hepatitis B vaccine  You may need this if you have certain conditions or if you travel or work in places where you may be exposed to hepatitis B. Haemophilus influenzae type b (Hib) vaccine  You may need this if you have certain conditions. You may receive vaccines as individual doses or as more than one vaccine together in one shot (combination vaccines). Talk with your health care provider about the risks and benefits of combination vaccines. What tests do I need?  Blood tests  Lipid and cholesterol levels. These may be checked every 5 years starting at age 20.  Hepatitis C test.  Hepatitis B test. Screening  Diabetes screening. This is done by checking your blood sugar (glucose) after you have not eaten for a while (fasting).  Sexually transmitted disease (STD) testing.  BRCA-related cancer screening. This may be done if you have a family history of breast, ovarian, tubal, or peritoneal cancers.  Pelvic exam and Pap test. This may be done every 3 years starting at age 21. Starting at age 30, this may be done every 5 years if you have a Pap test in combination with an HPV test. Talk with your health care provider about your test results, treatment options,   and if necessary, the need for more tests. Follow these instructions at home: Eating and drinking   Eat a diet that includes fresh fruits and vegetables, whole grains, lean protein, and low-fat dairy.  Take vitamin and mineral supplements as recommended by your health care provider.  Do not drink alcohol if: ? Your health care  provider tells you not to drink. ? You are pregnant, may be pregnant, or are planning to become pregnant.  If you drink alcohol: ? Limit how much you have to 0-1 drink a day. ? Be aware of how much alcohol is in your drink. In the [place] one drink equals one 12 oz bottle of beer (355 mL), one 5 oz glass of wine (148 mL), or one 1 oz glass of hard liquor (44 mL). Lifestyle  Take daily care of your teeth and gums.  Stay active. Exercise for at least 30 minutes on 5 or more days each week.  Do not use any products that contain nicotine or tobacco, such as cigarettes, e-cigarettes, and chewing tobacco. If you need help quitting, ask your health care provider.  If you are sexually active, practice safe sex. Use a condom or other form of birth control (contraception) in order to prevent pregnancy and STIs (sexually transmitted infections). If you plan to become pregnant, see your health care provider for a preconception visit. What's next?  Visit your health care provider once a year for a well check visit.  Ask your health care provider how often you should have your eyes and teeth checked.  Stay up to date on all vaccines. This information is not intended to replace advice given to you by your health care provider. Make sure you discuss any questions you have with your health care provider. Document Revised: 01/12/2018 Document Reviewed: 01/12/2018 Elsevier Patient Education  2020 Elsevier Inc.  

## 2020-01-08 NOTE — Progress Notes (Signed)
Subjective:    Patient ID: Kristy Chung, female    DOB: February 27, 1986, 34 y.o.   MRN: [id number]  HPI  Patient presents today for complete physical.  Immunizations: completed pfizer.  Pneumovax 23 2015, Td 2018 Diet: diet is improved Wt Readings from Last 3 Encounters:  01/08/20 262 lb (118.8 kg)  06/19/19 272 lb (123.4 kg)  05/08/19 262 lb (118.8 kg)  Exercise: treadmill, walking greenway Pap Smear:  06/13/18 Vision: 11/22/19 Dental: up to date  DM2- not checking often. Maintained on metformin 1016m bid.   Lab Results  Component Value Date   HGBA1C 6.7 (H) 06/19/2019   HGBA1C 7.0 (H) 10/25/2018   HGBA1C 6.7 (H) 04/12/2018   Lab Results  Component Value Date   MICROALBUR <0.7 10/25/2018   LDLCALC 104 (H) 09/16/2017   CREATININE 0.60 06/19/2019   Hyperlipidemia- was changed to lipitor.  Lab Results  Component Value Date   CHOL 194 06/19/2019   HDL 45.40 06/19/2019   LDLCALC 104 (H) 09/16/2017   LDLDIRECT 125.0 06/19/2019   TRIG 324.0 (H) 06/19/2019   CHOLHDL 4 06/19/2019   Hypothyroid- maintained on synthroid 520m. Lab Results  Component Value Date   TSH 1.29 10/25/2018   HTN-  BP Readings from Last 3 Encounters:  01/08/20 123/83  06/19/19 128/80  05/08/19 118/68     Review of Systems  Constitutional: Negative for unexpected weight change.  HENT: Negative for hearing loss and rhinorrhea.   Respiratory: Negative for cough and shortness of breath.   Cardiovascular: Negative for chest pain.  Gastrointestinal: Negative for blood in stool, constipation and diarrhea.  Genitourinary: Negative for dysuria, frequency and hematuria.  Musculoskeletal: Negative for arthralgias and myalgias.  Skin: Negative for rash.  Neurological: Positive for headaches (occasional HA's ).  Hematological: Negative for adenopathy.  Psychiatric/Behavioral:       Denies depression/anxiety   Past Medical History:  Diagnosis Date  . Allergy   . History of chicken pox   . Iron  deficiency anemia 06/20/2019     Social History   Socioeconomic History  . Marital status: Single    Spouse name: Not on file  . Number of children: Not on file  . Years of education: Not on file  . Highest education level: Not on file  Occupational History  . Not on file  Tobacco Use  . Smoking status: Never Smoker  . Smokeless tobacco: Never Used  Vaping Use  . Vaping Use: Never used  Substance and Sexual Activity  . Alcohol use: Yes    Alcohol/week: 0.0 standard drinks    Comment: occasional  . Drug use: No  . Sexual activity: Yes    Partners: Male    Comment: INTERCOURSE AGE 62, SEXUAL PARTNERS 5  Other Topics Concern  . 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 Difficulty of Paying Living Expenses: Not on file  Food Insecurity:   . Worried About [employer] fundraisern the Last Year: Not on file  . Ran Out of Food in the Last Year: Not on file  Transportation Needs:   . Lack of Transportation (Medical): Not on file  . Lack of Transportation (Non-Medical): Not on file  Physical Activity:   .  Days of Exercise per Week: Not on file  . Minutes of Exercise per Session: Not on file  Stress:   . Feeling of Stress : Not on file  Social Connections:   . Frequency of Communication with Friends and Family: Not on file  . Frequency of Social Gatherings with Friends and Family: Not on file  . Attends Religious Services: Not on file  . Active Member of Clubs or Organizations: Not on file  . Attends Archivist Meetings: Not on file  . Marital Status: Not on file  Intimate Partner Violence:   . Fear of Current or Ex-Partner: Not on file  . Emotionally Abused: Not on file  . Physically Abused: Not on file  . Sexually Abused: Not on file    Past Surgical  History:  Procedure Laterality Date  . WISDOM TOOTH EXTRACTION  2005    Family History  Problem Relation Age of Onset  . Hyperlipidemia Mother   . Depression Mother   . Hyperlipidemia Father   . Hypertension Father   . Hypothyroidism Maternal Grandmother   . Heart disease Maternal Grandfather        MI, stent, CAD  . Hypertension Maternal Grandfather   . Hyperlipidemia Maternal Grandfather   . COPD Paternal Grandmother   . Hypertension Paternal Grandmother   . Hyperlipidemia Paternal Grandmother   . Heart disease Paternal Grandmother   . AAA (abdominal aortic aneurysm) Paternal Grandfather   . Dementia Paternal Grandfather   . Heart disease Paternal Grandfather     Allergies  Allergen Reactions  . Amlodipine     flushing  . Other     CINNAMON GUM--blisters.    Current Outpatient Medications on File Prior to Visit  Medication Sig Dispense Refill  . atorvastatin (LIPITOR) 80 MG tablet TAKE 1 TABLET (80 MG TOTAL) BY MOUTH DAILY. 90 tablet 0  . Ferrous Sulfate (IRON) 325 (65 Fe) MG TABS 1 tablet by mouth twice daily 30 tablet 0  . glucose blood (FREESTYLE TEST STRIPS) test strip Use as instructed (Patient taking differently: Use as instructed to check blood sugar 1 to 2 times daily.) 100 each 12  . glucose monitoring kit (FREESTYLE) monitoring kit 1 each by Does not apply route as needed for other. 1 each 0  . hydrochlorothiazide (HYDRODIURIL) 25 MG tablet Take 1 tablet (25 mg total) by mouth daily. 90 tablet 1  . Lancets (FREESTYLE) lancets Use as instructed 100 each 12  . levothyroxine (SYNTHROID) 50 MCG tablet TAKE 1 TABLET BY MOUTH ONCE DAILY BEFORE BREAKFAST 90 tablet 1  . medroxyPROGESTERone (PROVERA) 10 MG tablet Take for 5 days Every 2 mo if no cycle 15 tablet 1  . metFORMIN (GLUCOPHAGE) 1000 MG tablet TAKE 1 TABLET (1,000 MG TOTAL) BY MOUTH 2 (TWO) TIMES DAILY WITH A MEAL. 180 tablet 1  . metoprolol succinate (TOPROL-XL) 25 MG 24 hr tablet Take 1 tablet (25 mg total)  by mouth daily. 90 tablet 1  . Multiple Vitamins-Iron (MULTI-VITAMIN/IRON) TABS Take 1 tablet by mouth daily.  0  . Omega-3 Fatty Acids (FISH OIL) 1000 MG CAPS Take 2,000 mg by mouth 2 (two) times daily.    Marland Kitchen omeprazole (PRILOSEC) 20 MG capsule TAKE 1 CAPSULE BY MOUTH ONCE DAILY 90 capsule 1  . spironolactone (ALDACTONE) 50 MG tablet TAKE 1 TABLET (50 MG TOTAL) BY MOUTH DAILY. 90 tablet 1  . traZODone (DESYREL) 50 MG tablet TAKE 1/2 - 1 TABLET BY MOUTH AT BEDTIME AS NEEDED FOR SLEEP *NEED  OFFICE VISIT* 45 tablet 1  . vitamin B-12 (CYANOCOBALAMIN) 1000 MCG tablet Take 1,000 mcg by mouth daily.     No current facility-administered medications on file prior to visit.    BP 123/83 (BP Location: Left Arm, Patient Position: Sitting, Cuff Size: Large)   Pulse 80   Temp 98.4 F (36.9 C) (Oral)   Resp 16   Ht _0  (1.676 m)   Wt 262 lb (118.8 kg)   SpO2 99%   BMI 42.29 kg/m       Objective:   Physical Exam Physical Exam  Constitutional: She is oriented to person, place, and time. She appears well-developed and well-nourished. No distress.  HENT:  Head: Normocephalic and atraumatic.  Right Ear: Tympanic membrane and ear canal normal.  Left Ear: Tympanic membrane and ear canal normal.  Mouth/Throat: Not examined- pt wearing mask Eyes: Pupils are equal, round, and reactive to light. No scleral icterus.  Neck: Normal range of motion. No thyromegaly present.  Cardiovascular: Normal rate and regular rhythm.   No murmur heard. Pulmonary/Chest: Effort normal and breath sounds normal. No respiratory distress. He has no wheezes. She has no rales. She exhibits no tenderness.  Abdominal: Soft. Bowel sounds are normal. She exhibits no distension and no mass. There is no tenderness. There is no rebound and no guarding.  Musculoskeletal: She exhibits no edema.  Lymphadenopathy:    She has no cervical adenopathy.  Neurological: She is alert and oriented to person, place, and time. She has normal  patellar reflexes. She exhibits normal muscle tone. Coordination normal.  Skin: Skin is warm and dry.  Psychiatric: She has a normal mood and affect. Her behavior is normal. Judgment and thought content normal.  Breast/pelvic: deferred  Assessment & Plan:   Preventative care-  Encouraged pt to continue her work on healthy diet, exercise and weight loss. Immunizations reviewed and up to date.  DM2- clinically stable. Obtain A1C,urine microalbumin.  Hyperlipidemia- tolerating statin. Obtain follow up lipid panel  Hypothyroid- stable on current dose of synthroid. Obtain follow up tsh.  Hepatic adenoma- following with hepatology.  Iron deficiency anemia- tolerating iron without constipation.  Obtain CBC, iron studies.  HTN- bp stable on aldactone, metoprolol and hctz. Continue same.  This visit occurred during the SARS-CoV-2 public health emergency.  Safety protocols were in place, including screening questions prior to the visit, additional usage of staff PPE, and extensive cleaning of exam room while observing appropriate contact time as indicated for disinfecting solutions.          Assessment & Plan:

## 2020-01-09 LAB — LIPID PANEL
Cholesterol: 184 mg/dL (ref <200)
HDL: 43 mg/dL — ABNORMAL LOW (ref >=50)
LDL Cholesterol (Calc): 89 mg/dL (calc)
Non-HDL Cholesterol (Calc): 141 mg/dL (calc) — ABNORMAL HIGH (ref <130)
Total CHOL/HDL Ratio: 4.3 (calc) (ref <5.0)
Triglycerides: 390 mg/dL — ABNORMAL HIGH (ref <150)

## 2020-01-09 LAB — CBC WITH DIFFERENTIAL/PLATELET
Absolute Monocytes: 518 cells/uL (ref 200–950)
Basophils Absolute: 54 cells/uL (ref 0–200)
Basophils Relative: 0.5 %
Eosinophils Absolute: 130 cells/uL (ref 15–500)
Eosinophils Relative: 1.2 %
HCT: 41.5 % (ref 35.0–45.0)
Hemoglobin: 13.5 g/dL (ref 11.7–15.5)
Lymphs Abs: 3186 cells/uL (ref 850–3900)
MCH: 28.4 pg (ref 27.0–33.0)
MCHC: 32.5 g/dL (ref 32.0–36.0)
MCV: 87.4 fL (ref 80.0–100.0)
MPV: 10 fL (ref 7.5–12.5)
Monocytes Relative: 4.8 %
Neutro Abs: 6912 cells/uL (ref 1500–7800)
Neutrophils Relative %: 64 %
Platelets: 371 10*3/uL (ref 140–400)
RBC: 4.75 10*6/uL (ref 3.80–5.10)
RDW: 14.6 % (ref 11.0–15.0)
Total Lymphocyte: 29.5 %
WBC: 10.8 10*3/uL (ref 3.8–10.8)

## 2020-01-09 LAB — FERRITIN: Ferritin: 26 ng/mL (ref 16–154)

## 2020-01-09 LAB — MICROALBUMIN / CREATININE URINE RATIO
Creatinine, Urine: 111 mg/dL (ref 20–275)
Microalb Creat Ratio: 4 mcg/mg creat (ref <30)
Microalb, Ur: 0.4 mg/dL

## 2020-01-09 LAB — HEMOGLOBIN A1C
Hgb A1c MFr Bld: 6 % of total Hgb — ABNORMAL HIGH (ref <5.7)
Mean Plasma Glucose: 126 (calc)
eAG (mmol/L): 7 (calc)

## 2020-01-09 LAB — COMPREHENSIVE METABOLIC PANEL
AG Ratio: 1.7 (calc) (ref 1.0–2.5)
ALT: 58 U/L — ABNORMAL HIGH (ref 6–29)
AST: 39 U/L — ABNORMAL HIGH (ref 10–30)
Albumin: 4.2 g/dL (ref 3.6–5.1)
Alkaline phosphatase (APISO): 101 U/L (ref 31–125)
BUN: 9 mg/dL (ref 7–25)
CO2: 25 mmol/L (ref 20–32)
Calcium: 9.6 mg/dL (ref 8.6–10.2)
Chloride: 103 mmol/L (ref 98–110)
Creat: 0.69 mg/dL (ref 0.50–1.10)
Globulin: 2.5 g/dL (calc) (ref 1.9–3.7)
Glucose, Bld: 100 mg/dL — ABNORMAL HIGH (ref 65–99)
Potassium: 3.9 mmol/L (ref 3.5–5.3)
Sodium: 140 mmol/L (ref 135–146)
Total Bilirubin: 0.4 mg/dL (ref 0.2–1.2)
Total Protein: 6.7 g/dL (ref 6.1–8.1)

## 2020-01-09 LAB — IRON: Iron: 50 ug/dL (ref 40–190)

## 2020-01-09 LAB — TSH: TSH: 1.3 mIU/L

## 2020-01-15 ENCOUNTER — Encounter: Payer: Self-pay | Admitting: Nurse Practitioner

## 2020-01-15 ENCOUNTER — Ambulatory Visit: Payer: 59 | Admitting: Nurse Practitioner

## 2020-01-15 ENCOUNTER — Other Ambulatory Visit: Payer: Self-pay

## 2020-01-15 VITALS — BP 122/80

## 2020-01-15 DIAGNOSIS — Z113 Encounter for screening for infections with a predominantly sexual mode of transmission: Secondary | ICD-10-CM | POA: Diagnosis not present

## 2020-01-15 NOTE — Progress Notes (Signed)
   Acute Office Visit  Subjective:    Patient ID: Kristy Chung, female    DOB: 1986-04-18, 34 y.o.   MRN: [id number]   HPI 34 y.o. presents today for STD screening. She found out her previous partner had relations with other women. She denies any symptoms.    Review of Systems  Constitutional: Negative.   Genitourinary: Negative.        Objective:    Physical Exam Constitutional:      Appearance: Normal appearance.  Genitourinary:    General: Normal vulva.     Vagina: Normal.     Cervix: Normal.     BP 122/80 (BP Location: Right Arm, Patient Position: Sitting, Cuff Size: Large)   LMP 12/25/2019  Wt Readings from Last 3 Encounters:  01/08/20 262 lb (118.8 kg)  06/19/19 272 lb (123.4 kg)  05/08/19 262 lb (118.8 kg)        Assessment & Plan:   Problem List Items Addressed This Visit    None    Visit Diagnoses    Screen for STD (sexually transmitted disease)    -  Primary   Relevant Orders   C. trachomatis/N. gonorrhoeae RNA   HIV Antibody (routine testing w rflx)   RPR      Plan: Gonorrhea/Chlamydia, HIV, RPR today. Will reach out with results.     Tamela Gammon [facility], 12:31 PM 01/15/2020

## 2020-01-16 LAB — C. TRACHOMATIS/N. GONORRHOEAE RNA
C. trachomatis RNA, TMA: NOT DETECTED
N. gonorrhoeae RNA, TMA: NOT DETECTED

## 2020-01-17 LAB — RPR: RPR Ser Ql: NONREACTIVE

## 2020-01-17 LAB — HIV ANTIBODY (ROUTINE TESTING W REFLEX): HIV 1&2 Ab, 4th Generation: NONREACTIVE

## 2020-01-22 MED FILL — traZODone HCL 50 MG TABS: 50 | 45 days supply | Qty: 45 | Fill #1

## 2020-02-12 MED FILL — OMEPRAZOLE 20 MG CAP: 20 | 90 days supply | Qty: 90 | Fill #1

## 2020-02-12 MED FILL — ATORVASTATIN 80 MG TABLET: 80 | 90 days supply | Qty: 90 | Fill #0

## 2020-02-15 ENCOUNTER — Other Ambulatory Visit: Payer: Self-pay | Admitting: Nurse Practitioner

## 2020-02-15 DIAGNOSIS — D134 Benign neoplasm of liver: Secondary | ICD-10-CM

## 2020-03-05 DIAGNOSIS — K760 Fatty (change of) liver, not elsewhere classified: Secondary | ICD-10-CM | POA: Diagnosis not present

## 2020-03-10 ENCOUNTER — Ambulatory Visit
Admission: RE | Admit: 2020-03-10 | Discharge: 2020-03-10 | Disposition: A | Discharge status: Home / Self Care | Payer: 59 | Source: Ambulatory Visit | Attending: Nurse Practitioner | Admitting: Nurse Practitioner

## 2020-03-10 DIAGNOSIS — K760 Fatty (change of) liver, not elsewhere classified: Secondary | ICD-10-CM | POA: Diagnosis not present

## 2020-03-10 DIAGNOSIS — D134 Benign neoplasm of liver: Secondary | ICD-10-CM

## 2020-03-10 DIAGNOSIS — K769 Liver disease, unspecified: Secondary | ICD-10-CM | POA: Diagnosis not present

## 2020-03-10 MED ORDER — GADOXETATE DISODIUM 0.25 MMOL/ML IV SOLN
10.0000 mL | Freq: Once | INTRAVENOUS | Status: AC | PRN
  Administered 2020-03-10: 10 mL via INTRAVENOUS

## 2020-03-11 ENCOUNTER — Other Ambulatory Visit: Payer: Self-pay | Admitting: Family

## 2020-03-11 MED FILL — LEVOTHYROXINE SODIUM 50 MCG: 50 | 90 days supply | Qty: 90 | Fill #1

## 2020-03-11 MED FILL — traZODone HCL 50 MG TABS: 50 | 45 days supply | Qty: 45 | Fill #0

## 2020-04-02 MED FILL — METFORMIN HCL 1000 MG TABS: 1000 | 90 days supply | Qty: 180 | Fill #1

## 2020-04-03 DIAGNOSIS — K760 Fatty (change of) liver, not elsewhere classified: Secondary | ICD-10-CM | POA: Diagnosis not present

## 2020-04-03 DIAGNOSIS — D134 Benign neoplasm of liver: Secondary | ICD-10-CM | POA: Diagnosis not present

## 2020-05-01 ENCOUNTER — Other Ambulatory Visit: Payer: Self-pay | Admitting: Family

## 2020-05-01 MED FILL — OMEPRAZOLE 20 MG CAP: 20 | 90 days supply | Qty: 90 | Fill #0

## 2020-05-01 MED FILL — traZODone HCL 50 MG TABS: 50 | 45 days supply | Qty: 45 | Fill #1

## 2020-05-01 MED FILL — SPIRONOLACTONE 50 MG TABLET: 50 | 90 days supply | Qty: 90 | Fill #1

## 2020-06-04 ENCOUNTER — Other Ambulatory Visit: Payer: Self-pay | Admitting: Family

## 2020-06-04 MED FILL — HYDROCHLOROTHIAZIDE 25 MG T: 25 | 90 days supply | Qty: 90 | Fill #1

## 2020-06-04 MED FILL — METOPROLOL SUCCINATE ER 25: 25 | 90 days supply | Qty: 90 | Fill #1

## 2020-06-06 MED FILL — traZODone HCL 50 MG TABS: 50 | 45 days supply | Qty: 45 | Fill #0

## 2020-06-19 ENCOUNTER — Ambulatory Visit (INDEPENDENT_AMBULATORY_CARE_PROVIDER_SITE_OTHER): Payer: 59 | Admitting: Nurse Practitioner

## 2020-06-19 ENCOUNTER — Encounter: Payer: Self-pay | Admitting: Nurse Practitioner

## 2020-06-19 ENCOUNTER — Other Ambulatory Visit: Payer: Self-pay

## 2020-06-19 VITALS — BP 130/82 | HR 64 | Resp 17 | Ht 67.0 in | Wt 265.0 lb

## 2020-06-19 DIAGNOSIS — Z01419 Encounter for gynecological examination (general) (routine) without abnormal findings: Secondary | ICD-10-CM | POA: Diagnosis not present

## 2020-06-19 NOTE — Progress Notes (Signed)
   Kristy Chung [birth year] [id number]   History:  35 y.o. G0 presents for annual exam without GYN complaints. Monthly cycles. History of irregular cycles but has not had to take Provera for cycle initiation in over a year. Normal pap history. DM, HTN, and hypothyroidism managed by PCP. History of liver adenoma that decreased in size after stopping OCPs. Sexually active with 1 partner, denies need for STD screening and uses condom consistently.  Gynecologic History Patient's last menstrual period was 05/17/2020 (exact date). Period Cycle (Days): 24 Period Duration (Days): 6 Period Pattern: Regular Menstrual Flow: Moderate Menstrual Control: Tampon,Maxi pad Menstrual Control Change Freq (Hours): 3-4 Dysmenorrhea: (!) Moderate Dysmenorrhea Symptoms: Cramping Contraception/Family planning: condoms  Health Maintenance Last Pap: 06/13/2018. Results were: normal Last mammogram: N/A Last colonoscopy: N/A Last Dexa: N/A  Past medical history, past surgical history, family history and social history were all reviewed and documented in the EPIC chart. 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   ROS:  A ROS was performed and pertinent positives and negatives are included.  Exam:  Vitals:   06/19/20 1033  BP: 130/82  Pulse: 64  Resp: 17  Weight: 265 lb (120.2 kg)  Height: 5\' 7"  (1.702 m)   Body mass index is 41.5 kg/m.  General appearance:  Normal Thyroid:  Symmetrical, normal in size, without palpable masses or nodularity. Respiratory  Auscultation:  Clear without wheezing or rhonchi Cardiovascular  Auscultation:  Regular rate, without rubs, murmurs or gallops  Edema/varicosities:  Not grossly evident Abdominal  Soft,nontender, without masses, guarding or rebound.  Liver/spleen:  No organomegaly noted  Hernia:  None appreciated  Skin  Inspection:  Grossly normal   Breasts: Examined lying and sitting.   Right: Without masses, retractions, discharge or axillary adenopathy.   Left: Without  masses, retractions, discharge or axillary adenopathy. Gentitourinary   Inguinal/mons:  Normal without inguinal adenopathy  External genitalia:  Normal  BUS/Urethra/Skene's glands:  Normal  Vagina:  Normal, light bleeding from menses  Cervix:  Normal  Uterus:  Normal in size, shape and contour.  Midline and mobile  Adnexa/parametria:     Rt: Without masses or tenderness.   Lt: Without masses or tenderness.  Anus and perineum: Normal  Assessment/Plan:  35 y.o. G0 for annual exam.   Well female exam with routine gynecological exam - Education provided on SBEs, importance of preventative screenings, current guidelines, high calcium diet, regular exercise, and multivitamin daily. Labs with PCP.   Screening for cervical cancer - Normal Pap history.  Will repeat at 5-year interval per guidelines.  Follow up in 1 year for annual.    Tamela Gammon [facility], 10:51 AM 06/19/2020

## 2020-06-19 NOTE — Patient Instructions (Signed)
Health Maintenance, Female Adopting a healthy lifestyle and getting preventive care are important in promoting health and wellness. Ask your health care provider about:  The right schedule for you to have regular tests and exams.  Things you can do on your own to prevent diseases and keep yourself healthy. What should I know about diet, weight, and exercise? Eat a healthy diet  Eat a diet that includes plenty of vegetables, fruits, low-fat dairy products, and lean protein.  Do not eat a lot of foods that are high in solid fats, added sugars, or sodium.   Maintain a healthy weight Body mass index (BMI) is used to identify weight problems. It estimates body fat based on height and weight. Your health care provider can help determine your BMI and help you achieve or maintain a healthy weight. Get regular exercise Get regular exercise. This is one of the most important things you can do for your health. Most adults should:  Exercise for at least 150 minutes each week. The exercise should increase your heart rate and make you sweat (moderate-intensity exercise).  Do strengthening exercises at least twice a week. This is in addition to the moderate-intensity exercise.  Spend less time sitting. Even light physical activity can be beneficial. Watch cholesterol and blood lipids Have your blood tested for lipids and cholesterol at 35 years of age, then have this test every 5 years. Have your cholesterol levels checked more often if:  Your lipid or cholesterol levels are high.  You are older than 35 years of age.  You are at high risk for heart disease. What should I know about cancer screening? Depending on your health history and family history, you may need to have cancer screening at various ages. This may include screening for:  Breast cancer.  Cervical cancer.  Colorectal cancer.  Skin cancer.  Lung cancer. What should I know about heart disease, diabetes, and high blood  pressure? Blood pressure and heart disease  High blood pressure causes heart disease and increases the risk of stroke. This is more likely to develop in people who have high blood pressure readings, are of African descent, or are overweight.  Have your blood pressure checked: ? Every 3-5 years if you are 18-39 years of age. ? Every year if you are 40 years old or older. Diabetes Have regular diabetes screenings. This checks your fasting blood sugar level. Have the screening done:  Once every three years after age 40 if you are at a normal weight and have a low risk for diabetes.  More often and at a younger age if you are overweight or have a high risk for diabetes. What should I know about preventing infection? Hepatitis B If you have a higher risk for hepatitis B, you should be screened for this virus. Talk with your health care provider to find out if you are at risk for hepatitis B infection. Hepatitis C Testing is recommended for:  Everyone born from 1945 through 1965.  Anyone with known risk factors for hepatitis C. Sexually transmitted infections (STIs)  Get screened for STIs, including gonorrhea and chlamydia, if: ? You are sexually active and are younger than 35 years of age. ? You are older than 35 years of age and your health care provider tells you that you are at risk for this type of infection. ? Your sexual activity has changed since you were last screened, and you are at increased risk for chlamydia or gonorrhea. Ask your health care provider   if you are at risk.  Ask your health care provider about whether you are at high risk for HIV. Your health care provider may recommend a prescription medicine to help prevent HIV infection. If you choose to take medicine to prevent HIV, you should first get tested for HIV. You should then be tested every 3 months for as long as you are taking the medicine. Pregnancy  If you are about to stop having your period (premenopausal) and  you may become pregnant, seek counseling before you get pregnant.  Take 400 to 800 micrograms (mcg) of folic acid every day if you become pregnant.  Ask for birth control (contraception) if you want to prevent pregnancy. Osteoporosis and menopause Osteoporosis is a disease in which the bones lose minerals and strength with aging. This can result in bone fractures. If you are 65 years old or older, or if you are at risk for osteoporosis and fractures, ask your health care provider if you should:  Be screened for bone loss.  Take a calcium or vitamin D supplement to lower your risk of fractures.  Be given hormone replacement therapy (HRT) to treat symptoms of menopause. Follow these instructions at home: Lifestyle  Do not use any products that contain nicotine or tobacco, such as cigarettes, e-cigarettes, and chewing tobacco. If you need help quitting, ask your health care provider.  Do not use street drugs.  Do not share needles.  Ask your health care provider for help if you need support or information about quitting drugs. Alcohol use  Do not drink alcohol if: ? Your health care provider tells you not to drink. ? You are pregnant, may be pregnant, or are planning to become pregnant.  If you drink alcohol: ? Limit how much you use to 0-1 drink a day. ? Limit intake if you are breastfeeding.  Be aware of how much alcohol is in your drink. In the [place] one drink equals one 12 oz bottle of beer (355 mL), one 5 oz glass of wine (148 mL), or one 1 oz glass of hard liquor (44 mL). General instructions  Schedule regular health, dental, and eye exams.  Stay current with your vaccines.  Tell your health care provider if: ? You often feel depressed. ? You have ever been abused or do not feel safe at home. Summary  Adopting a healthy lifestyle and getting preventive care are important in promoting health and wellness.  Follow your health care provider's instructions about healthy  diet, exercising, and getting tested or screened for diseases.  Follow your health care provider's instructions on monitoring your cholesterol and blood pressure. This information is not intended to replace advice given to you by your health care provider. Make sure you discuss any questions you have with your health care provider. Document Revised: 04/26/2018 Document Reviewed: 04/26/2018 Elsevier Patient Education  2021 [employer] Inc[employer]  

## 2020-06-20 ENCOUNTER — Other Ambulatory Visit: Payer: Self-pay | Admitting: Family

## 2020-06-20 ENCOUNTER — Telehealth: Payer: Self-pay | Admitting: Family

## 2020-06-20 MED FILL — ATORVASTATIN 80 MG TABLET: 80 | 90 days supply | Qty: 90 | Fill #0

## 2020-06-20 NOTE — Telephone Encounter (Signed)
See mychart.  

## 2020-07-08 ENCOUNTER — Ambulatory Visit: Payer: 59 | Admitting: Family

## 2020-07-08 ENCOUNTER — Other Ambulatory Visit: Payer: Self-pay | Admitting: Family

## 2020-07-08 ENCOUNTER — Other Ambulatory Visit: Payer: Self-pay

## 2020-07-08 ENCOUNTER — Encounter: Payer: Self-pay | Admitting: Family

## 2020-07-08 VITALS — BP 123/83 | HR 78 | Temp 98.5°F | Resp 16 | Ht 67.0 in | Wt 269.0 lb

## 2020-07-08 DIAGNOSIS — L680 Hirsutism: Secondary | ICD-10-CM

## 2020-07-08 DIAGNOSIS — D649 Anemia, unspecified: Secondary | ICD-10-CM | POA: Diagnosis not present

## 2020-07-08 DIAGNOSIS — E119 Type 2 diabetes mellitus without complications: Secondary | ICD-10-CM

## 2020-07-08 DIAGNOSIS — E039 Hypothyroidism, unspecified: Secondary | ICD-10-CM | POA: Diagnosis not present

## 2020-07-08 DIAGNOSIS — K219 Gastro-esophageal reflux disease without esophagitis: Secondary | ICD-10-CM

## 2020-07-08 DIAGNOSIS — I10 Essential (primary) hypertension: Secondary | ICD-10-CM

## 2020-07-08 DIAGNOSIS — G4700 Insomnia, unspecified: Secondary | ICD-10-CM

## 2020-07-08 MED ORDER — ATORVASTATIN CALCIUM 80 MG PO TABS: 80.0000 mg | ORAL_TABLET | Freq: Every day | ORAL | 0 refills | Status: DC

## 2020-07-08 MED ORDER — OMEPRAZOLE 20 MG PO CPDR: 20.0000 mg | DELAYED_RELEASE_CAPSULE | Freq: Every day | ORAL | 1 refills | Status: DC

## 2020-07-08 MED ORDER — TRAZODONE HCL 50 MG PO TABS: 25.0000 mg | ORAL_TABLET | Freq: Every evening | ORAL | 1 refills | Status: DC | PRN

## 2020-07-08 MED ORDER — HYDROCHLOROTHIAZIDE 25 MG PO TABS: 25.0000 mg | ORAL_TABLET | Freq: Every day | ORAL | 1 refills | Status: DC

## 2020-07-08 MED ORDER — METFORMIN HCL 1000 MG PO TABS: 1000.0000 mg | ORAL_TABLET | Freq: Two times a day (BID) | ORAL | 1 refills | Status: DC

## 2020-07-08 MED ORDER — SPIRONOLACTONE 50 MG PO TABS: 50.0000 mg | ORAL_TABLET | Freq: Every day | ORAL | 1 refills | Status: DC

## 2020-07-08 MED ORDER — LEVOTHYROXINE SODIUM 50 MCG PO TABS: ORAL_TABLET | ORAL | 1 refills | Status: DC

## 2020-07-08 MED ORDER — METOPROLOL SUCCINATE ER 25 MG PO TB24: 25.0000 mg | ORAL_TABLET | Freq: Every day | ORAL | 1 refills | Status: DC

## 2020-07-08 MED FILL — METFORMIN HCL 1000 MG TABS: 1000 | 90 days supply | Qty: 180 | Fill #0

## 2020-07-08 MED FILL — LEVOTHYROXINE SODIUM 50 MCG: 50 | 90 days supply | Qty: 90 | Fill #0

## 2020-07-08 NOTE — Progress Notes (Signed)
Subjective:    Patient ID: Kristy Chung, female    DOB: 1986-02-21, 35 y.o.   MRN: [id number]  HPI  Patient presents today for follow up.  DM2- maintained on metformin 1063m twice daily.    Wt Readings from Last 3 Encounters:  07/08/20 269 lb (122 kg)  06/19/20 265 lb (120.2 kg)  01/08/20 262 lb (118.8 kg)    Lab Results  Component Value Date   HGBA1C 6.0 (H) 01/08/2020   HGBA1C 6.7 (H) 06/19/2019   HGBA1C 7.0 (H) 10/25/2018   Lab Results  Component Value Date   MICROALBUR 0.4 01/08/2020   LDLCALC 89 01/08/2020   CREATININE 0.69 01/08/2020   Hypothyroid- Feeling well on synthroid 50 mcg once daily.  Lab Results  Component Value Date   TSH 1.30 01/08/2020   HTN- maintained on hctz 243monce daily, toprol xl 2576mnce daily and aldactone 6m72mce daily.  BP Readings from Last 3 Encounters:  07/08/20 123/83  06/19/20 130/82  01/15/20 122/80   Insomnia- continues to use trazodone which is helpful.    Review of Systems    see HPI  Past Medical History:  Diagnosis Date  . Allergy   . Diabetes mellitus without complication (HCC)[place]. Hepatic adenoma    benign; related to birth control  . History of chicken pox   . Hyperlipidemia   . Hypertension   . Hypothyroid   . Iron deficiency anemia 06/20/2019     Social History   Socioeconomic History  . Marital status: Single    Spouse name: Not on file  . Number of children: Not on file  . Years of education: Not on file  . Highest education level: Not on file  Occupational History  . Not on file  Tobacco Use  . Smoking status: Never Smoker  . Smokeless tobacco: Never Used  Vaping Use  . Vaping Use: Never used  Substance and Sexual Activity  . Alcohol use: Yes    Alcohol/week: 0.0 standard drinks    Comment: occasional  . Drug use: No  . Sexual activity: Yes    Partners: Male    Birth control/protection: Condom    Comment: INTERCOURSE AGE 53, SEXUAL PARTNERS 5  Other Topics Concern  . 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 WISDOM TOOTH EXTRACTION  2005    Family History  Problem Relation Age of Onset  . Hyperlipidemia Mother   . Depression Mother   . Hyperlipidemia Father   . Hypertension Father   . Hypothyroidism Maternal Grandmother   . Heart disease Maternal Grandfather        MI, stent, CAD  . Hypertension Maternal Grandfather   . Hyperlipidemia Maternal Grandfather   . COPD Paternal Grandmother   . Hypertension Paternal Grandmother   . Hyperlipidemia Paternal Grandmother   . Heart disease Paternal Grandmother   . AAA (abdominal aortic aneurysm) Paternal Grandfather   . Dementia  Paternal Grandfather   . Heart disease Paternal Grandfather     Allergies  Allergen Reactions  . Amlodipine     flushing  . Other     CINNAMON GUM--blisters.    Current Outpatient Medications on File Prior to Visit  Medication Sig Dispense Refill  . atorvastatin (LIPITOR) 80 MG tablet TAKE 1 TABLET BY MOUTH ONCE DAILY 90 tablet 0  . Ferrous Sulfate (IRON) 325 (65 Fe) MG TABS 1 tablet by mouth twice daily 30 tablet 0  . glucose blood (FREESTYLE TEST STRIPS) test strip Use as instructed (Patient taking differently: Use as instructed to check blood sugar 1 to 2 times daily.) 100 each 12  . glucose monitoring kit (FREESTYLE) monitoring kit 1 each by Does not apply route as needed for other. 1 each 0  . hydrochlorothiazide (HYDRODIURIL) 25 MG tablet Take 1 tablet (25 mg total) by mouth daily. 90 tablet 1  . Lancets  (FREESTYLE) lancets Use as instructed 100 each 12  . levothyroxine (SYNTHROID) 50 MCG tablet TAKE 1 TABLET BY MOUTH ONCE DAILY BEFORE BREAKFAST 90 tablet 1  . medroxyPROGESTERone (PROVERA) 10 MG tablet Take for 5 days Every 2 mo if no cycle 15 tablet 1  . metFORMIN (GLUCOPHAGE) 1000 MG tablet TAKE 1 TABLET (1,000 MG TOTAL) BY MOUTH 2 (TWO) TIMES DAILY WITH A MEAL. 180 tablet 1  . metoprolol succinate (TOPROL-XL) 25 MG 24 hr tablet Take 1 tablet (25 mg total) by mouth daily. 90 tablet 1  . Multiple Vitamins-Iron (MULTI-VITAMIN/IRON) TABS Take 1 tablet by mouth daily.  0  . Omega-3 Fatty Acids (FISH OIL) 1000 MG CAPS Take 2,000 mg by mouth 2 (two) times daily.    Marland Kitchen omeprazole (PRILOSEC) 20 MG capsule TAKE 1 CAPSULE BY MOUTH ONCE DAILY 90 capsule 0  . spironolactone (ALDACTONE) 50 MG tablet TAKE 1 TABLET (50 MG TOTAL) BY MOUTH DAILY. 90 tablet 1  . traZODone (DESYREL) 50 MG tablet Take 0.5-1 tablets (25-50 mg total) by mouth at bedtime as needed for sleep. 45 tablet 0  . vitamin B-12 (CYANOCOBALAMIN) 1000 MCG tablet Take 1,000 mcg by mouth daily.     No current facility-administered medications on file prior to visit.    BP 123/83 (BP Location: Right Arm, Patient Position: Sitting, Cuff Size: Large)   Pulse 78   Temp 98.5 F (36.9 C) (Oral)   Resp 16   Ht 5' 7"  (1.702 m)   Wt 269 lb (122 kg)   SpO2 99%   BMI 42.13 kg/m    Objective:   Physical Exam Constitutional:      Appearance: She is well-developed and well-nourished.  Neck:     Thyroid: No thyromegaly.  Cardiovascular:     Rate and Rhythm: Normal rate and regular rhythm.     Heart sounds: Normal heart sounds. No murmur heard.   Pulmonary:     Effort: Pulmonary effort is normal. No respiratory distress.     Breath sounds: Normal breath sounds. No wheezing.  Musculoskeletal:     Cervical back: Neck supple.  Skin:    General: Skin is warm and dry.  Neurological:     Mental Status: She is alert and oriented to person,  place, and time.  Psychiatric:        Mood and Affect: Mood and affect normal.        Behavior: Behavior normal.        Thought Content: Thought content normal.        Judgment: Judgment normal.  Assessment & Plan:  DM2- clinically stable on metformin 103m bid.  Obtain follow up A1C.  Insomnia- stable. Continue trazodone 25-540mPO HS prn.  Hypothyroid- will obtain follow up tsh.  Continue synthroid 50 mcg once daily.  HTN- bp stable. Continue hctz 2556mnce daily, toprol xl 75m58mce daily and aldactone 50mg100me daily.   Anemia- check cbc, iron studies. Continue iron 325 mg bid.   GERD- stable on omeprazole 20mg 68m daily. Continue same.  This visit occurred during the SARS-CoV-2 public health emergency.  Safety protocols were in place, including screening questions prior to the visit, additional usage of staff PPE, and extensive cleaning of exam room while observing appropriate contact time as indicated for disinfecting solutions.

## 2020-07-10 ENCOUNTER — Other Ambulatory Visit (INDEPENDENT_AMBULATORY_CARE_PROVIDER_SITE_OTHER): Payer: 59

## 2020-07-10 ENCOUNTER — Other Ambulatory Visit: Payer: Self-pay

## 2020-07-10 DIAGNOSIS — E039 Hypothyroidism, unspecified: Secondary | ICD-10-CM | POA: Diagnosis not present

## 2020-07-10 DIAGNOSIS — D649 Anemia, unspecified: Secondary | ICD-10-CM

## 2020-07-10 DIAGNOSIS — E119 Type 2 diabetes mellitus without complications: Secondary | ICD-10-CM | POA: Diagnosis not present

## 2020-07-10 DIAGNOSIS — I10 Essential (primary) hypertension: Secondary | ICD-10-CM

## 2020-07-10 LAB — CBC WITH DIFFERENTIAL/PLATELET
Basophils Absolute: 0 10*3/uL (ref 0.0–0.1)
Basophils Relative: 0.3 % (ref 0.0–3.0)
Eosinophils Absolute: 0.2 10*3/uL (ref 0.0–0.7)
Eosinophils Relative: 1.9 % (ref 0.0–5.0)
HCT: 38.8 % (ref 36.0–46.0)
Hemoglobin: 12.9 g/dL (ref 12.0–15.0)
Lymphocytes Relative: 23.6 % (ref 12.0–46.0)
Lymphs Abs: 2.1 10*3/uL (ref 0.7–4.0)
MCHC: 33.4 g/dL (ref 30.0–36.0)
MCV: 86.2 fl (ref 78.0–100.0)
Monocytes Absolute: 0.4 10*3/uL (ref 0.1–1.0)
Monocytes Relative: 4.6 % (ref 3.0–12.0)
Neutro Abs: 6.2 10*3/uL (ref 1.4–7.7)
Neutrophils Relative %: 69.6 % (ref 43.0–77.0)
Platelets: 277 10*3/uL (ref 150.0–400.0)
RBC: 4.5 Mil/uL (ref 3.87–5.11)
RDW: 14.9 % (ref 11.5–15.5)
WBC: 8.9 10*3/uL (ref 4.0–10.5)

## 2020-07-10 LAB — BASIC METABOLIC PANEL
BUN: 11 mg/dL (ref 6–23)
CO2: 25 mEq/L (ref 19–32)
Calcium: 9.4 mg/dL (ref 8.4–10.5)
Chloride: 102 mEq/L (ref 96–112)
Creatinine, Ser: 0.64 mg/dL (ref 0.40–1.20)
GFR: 115.18 mL/min (ref >60.00)
Glucose, Bld: 164 mg/dL — ABNORMAL HIGH (ref 70–99)
Potassium: 3.7 mEq/L (ref 3.5–5.1)
Sodium: 136 mEq/L (ref 135–145)

## 2020-07-10 LAB — IRON: Iron: 54 ug/dL (ref 42–145)

## 2020-07-10 LAB — FERRITIN: Ferritin: 39.2 ng/mL (ref 10.0–291.0)

## 2020-07-10 LAB — HEMOGLOBIN A1C: Hgb A1c MFr Bld: 6.3 % (ref 4.6–6.5)

## 2020-07-10 LAB — TSH: TSH: 2.07 u[IU]/mL (ref 0.35–4.50)

## 2020-07-14 MED FILL — OMEPRAZOLE 20 MG CAP: 20 | 90 days supply | Qty: 90 | Fill #0

## 2020-07-14 MED FILL — SPIRONOLACTONE 50 MG TABLET: 50 | 90 days supply | Qty: 90 | Fill #0

## 2020-07-14 MED FILL — traZODone HCL 50 MG TABS: 50 | 90 days supply | Qty: 90 | Fill #0

## 2020-09-24 ENCOUNTER — Other Ambulatory Visit (HOSPITAL_BASED_OUTPATIENT_CLINIC_OR_DEPARTMENT_OTHER): Payer: Self-pay

## 2020-09-24 MED FILL — Metoprolol Succinate Tab ER 24HR 25 MG (Tartrate Equiv): ORAL | 90 days supply | Qty: 90 | Fill #0 | Status: AC

## 2020-09-24 MED FILL — Hydrochlorothiazide Tab 25 MG: ORAL | 90 days supply | Qty: 90 | Fill #0 | Status: AC

## 2020-10-09 MED FILL — Metformin HCl Tab 1000 MG: ORAL | 90 days supply | Qty: 180 | Fill #0 | Status: AC

## 2020-10-10 ENCOUNTER — Other Ambulatory Visit (HOSPITAL_BASED_OUTPATIENT_CLINIC_OR_DEPARTMENT_OTHER): Payer: Self-pay

## 2020-10-10 MED FILL — Atorvastatin Calcium Tab 80 MG (Base Equivalent): ORAL | 90 days supply | Qty: 90 | Fill #0 | Status: AC

## 2020-10-26 MED FILL — Trazodone HCl Tab 50 MG: ORAL | 90 days supply | Qty: 90 | Fill #0 | Status: AC

## 2020-10-27 ENCOUNTER — Other Ambulatory Visit (HOSPITAL_BASED_OUTPATIENT_CLINIC_OR_DEPARTMENT_OTHER): Payer: Self-pay

## 2020-11-09 MED FILL — Omeprazole Cap Delayed Release 20 MG: ORAL | 90 days supply | Qty: 90 | Fill #0 | Status: AC

## 2020-11-09 MED FILL — Levothyroxine Sodium Tab 50 MCG: ORAL | 90 days supply | Qty: 90 | Fill #0 | Status: AC

## 2020-11-10 ENCOUNTER — Other Ambulatory Visit (HOSPITAL_BASED_OUTPATIENT_CLINIC_OR_DEPARTMENT_OTHER): Payer: Self-pay

## 2020-11-13 ENCOUNTER — Other Ambulatory Visit (HOSPITAL_COMMUNITY): Payer: Self-pay

## 2020-11-21 ENCOUNTER — Other Ambulatory Visit (HOSPITAL_COMMUNITY): Payer: Self-pay

## 2020-12-24 ENCOUNTER — Other Ambulatory Visit: Payer: Self-pay | Admitting: Family

## 2020-12-24 MED FILL — Metoprolol Succinate Tab ER 24HR 25 MG (Tartrate Equiv): ORAL | 90 days supply | Qty: 90 | Fill #1 | Status: AC

## 2020-12-24 MED FILL — Spironolactone Tab 50 MG: ORAL | 90 days supply | Qty: 90 | Fill #0 | Status: AC

## 2020-12-24 MED FILL — Hydrochlorothiazide Tab 25 MG: ORAL | 90 days supply | Qty: 90 | Fill #1 | Status: AC

## 2020-12-25 ENCOUNTER — Other Ambulatory Visit (HOSPITAL_BASED_OUTPATIENT_CLINIC_OR_DEPARTMENT_OTHER): Payer: Self-pay

## 2020-12-25 MED ORDER — ATORVASTATIN CALCIUM 80 MG PO TABS
80.0000 mg | ORAL_TABLET | Freq: Every day | ORAL | 0 refills | Status: DC
  Filled 2020-12-25: qty 90, 90d supply, fill #0

## 2021-01-13 ENCOUNTER — Ambulatory Visit: Payer: 59 | Admitting: Family

## 2021-01-19 ENCOUNTER — Other Ambulatory Visit: Payer: Self-pay | Admitting: Family

## 2021-01-19 MED FILL — Trazodone HCl Tab 50 MG: ORAL | 90 days supply | Qty: 90 | Fill #1 | Status: AC

## 2021-01-20 ENCOUNTER — Other Ambulatory Visit (HOSPITAL_BASED_OUTPATIENT_CLINIC_OR_DEPARTMENT_OTHER): Payer: Self-pay

## 2021-01-20 MED ORDER — METFORMIN HCL 1000 MG PO TABS
ORAL_TABLET | Freq: Two times a day (BID) | ORAL | 1 refills | Status: DC
  Filled 2021-01-20: qty 180, 90d supply, fill #0
  Filled 2021-04-08: qty 180, 90d supply, fill #1

## 2021-01-20 MED ORDER — OMEPRAZOLE 20 MG PO CPDR
DELAYED_RELEASE_CAPSULE | Freq: Every day | ORAL | 1 refills | Status: DC
  Filled 2021-01-20: qty 90, 90d supply, fill #0
  Filled 2021-04-30: qty 90, 90d supply, fill #1

## 2021-01-22 ENCOUNTER — Other Ambulatory Visit (HOSPITAL_BASED_OUTPATIENT_CLINIC_OR_DEPARTMENT_OTHER): Payer: Self-pay

## 2021-01-22 DIAGNOSIS — E119 Type 2 diabetes mellitus without complications: Secondary | ICD-10-CM | POA: Diagnosis not present

## 2021-01-22 DIAGNOSIS — H52221 Regular astigmatism, right eye: Secondary | ICD-10-CM | POA: Diagnosis not present

## 2021-01-22 DIAGNOSIS — H35033 Hypertensive retinopathy, bilateral: Secondary | ICD-10-CM | POA: Diagnosis not present

## 2021-01-22 LAB — HM DIABETES EYE EXAM

## 2021-02-03 ENCOUNTER — Other Ambulatory Visit: Payer: Self-pay

## 2021-02-03 ENCOUNTER — Other Ambulatory Visit (HOSPITAL_BASED_OUTPATIENT_CLINIC_OR_DEPARTMENT_OTHER): Payer: Self-pay

## 2021-02-03 ENCOUNTER — Ambulatory Visit: Payer: 59 | Admitting: Family

## 2021-02-03 VITALS — BP 125/55 | HR 81 | Temp 98.7°F | Resp 16 | Ht 67.0 in | Wt 272.0 lb

## 2021-02-03 DIAGNOSIS — I10 Essential (primary) hypertension: Secondary | ICD-10-CM

## 2021-02-03 DIAGNOSIS — D134 Benign neoplasm of liver: Secondary | ICD-10-CM

## 2021-02-03 DIAGNOSIS — E119 Type 2 diabetes mellitus without complications: Secondary | ICD-10-CM

## 2021-02-03 DIAGNOSIS — E039 Hypothyroidism, unspecified: Secondary | ICD-10-CM | POA: Diagnosis not present

## 2021-02-03 DIAGNOSIS — Z1159 Encounter for screening for other viral diseases: Secondary | ICD-10-CM | POA: Diagnosis not present

## 2021-02-03 DIAGNOSIS — E785 Hyperlipidemia, unspecified: Secondary | ICD-10-CM

## 2021-02-03 DIAGNOSIS — L680 Hirsutism: Secondary | ICD-10-CM | POA: Diagnosis not present

## 2021-02-03 DIAGNOSIS — D509 Iron deficiency anemia, unspecified: Secondary | ICD-10-CM

## 2021-02-03 MED ORDER — SPIRONOLACTONE 50 MG PO TABS
ORAL_TABLET | Freq: Every day | ORAL | 1 refills | Status: DC
  Filled 2021-02-03: qty 90, fill #0
  Filled 2021-04-15: qty 90, 90d supply, fill #0
  Filled 2021-08-09: qty 90, 90d supply, fill #1

## 2021-02-03 MED ORDER — ATORVASTATIN CALCIUM 80 MG PO TABS
80.0000 mg | ORAL_TABLET | Freq: Every day | ORAL | 1 refills | Status: DC
  Filled 2021-02-03 – 2021-04-08 (×2): qty 90, 90d supply, fill #0
  Filled 2021-07-26: qty 90, 90d supply, fill #1

## 2021-02-03 MED ORDER — LEVOTHYROXINE SODIUM 50 MCG PO TABS
ORAL_TABLET | Freq: Every day | ORAL | 1 refills | Status: DC
  Filled 2021-02-03: qty 90, 90d supply, fill #0
  Filled 2021-04-15 – 2021-04-16 (×2): qty 90, 90d supply, fill #1

## 2021-02-03 MED ORDER — HYDROCHLOROTHIAZIDE 25 MG PO TABS
ORAL_TABLET | Freq: Every day | ORAL | 1 refills | Status: DC
  Filled 2021-02-03: qty 90, fill #0
  Filled 2021-04-08: qty 90, 90d supply, fill #0
  Filled 2021-08-09: qty 90, 90d supply, fill #1

## 2021-02-03 MED ORDER — METOPROLOL SUCCINATE ER 25 MG PO TB24
ORAL_TABLET | Freq: Every day | ORAL | 1 refills | Status: DC
  Filled 2021-02-03: qty 90, fill #0
  Filled 2021-04-15: qty 90, 90d supply, fill #0
  Filled 2021-07-26: qty 90, 90d supply, fill #1

## 2021-02-03 MED ORDER — TRAZODONE HCL 50 MG PO TABS
50.0000 mg | ORAL_TABLET | Freq: Every day | ORAL | 1 refills | Status: DC
  Filled 2021-02-03 – 2021-04-15 (×2): qty 90, 90d supply, fill #0
  Filled 2021-07-26: qty 90, 90d supply, fill #1

## 2021-02-03 NOTE — Assessment & Plan Note (Addendum)
Lab Results  Component Value Date   HGBA1C 6.3 07/10/2020   HGBA1C 6.0 (H) 01/08/2020   HGBA1C 6.7 (H) 06/19/2019   Lab Results  Component Value Date   MICROALBUR 0.4 01/08/2020   LDLCALC 89 01/08/2020   CREATININE 0.64 07/10/2020   Stable.  Continue metformin.

## 2021-02-03 NOTE — Assessment & Plan Note (Signed)
Stable. Sees Roosevelt Locks NP

## 2021-02-03 NOTE — Progress Notes (Signed)
Subjective:   By signing my name below, I, Kristy Chung, attest that this documentation has been prepared under the direction and in the presence of Debbrah Alar, NP, 02/03/2021  Patient ID: Kristy Chung, female    DOB: 1985/12/11, 35 y.o.   MRN: [id number]  Chief Complaint  Patient presents with   Hypertension    Here for follow up   Hypothyroidism    Here for follow up    HPI Patient is in today for an office visit.   Blood pressure: Her last blood pressure was recorded at 125/55. BP Readings from Last 3 Encounters:  02/03/21 (!) 125/55  07/08/20 123/83  06/19/20 130/82    Diabetes: Her last A1C was recorded at 6.3 in February 2022. Repeat needed. Lab Results  Component Value Date   HGBA1C 6.7 (H) 02/03/2021    Abdominal pain: She is no longer experiencing abdominal pain Insomnia: She is no longer having episodes of insomnia due to her use of 50 mg trazodone.  Anemia: Her last blood draw resulted in normal findings and she has been taking her iron supplement once everyday.  Acne: She has seen major improvements in her acne since beginning her  Vaccinations: She is interested in receiving her flu and updated Covid-19 vaccines once she is home from vacation.  Vision: She is UTD on vision.  Hepatitis C screening: She is interested in getting a hepatitis C screening done today.   Health Maintenance Due  Topic Date Due   COVID-19 Vaccine (3 - Booster for Pfizer series) 11/24/2019   INFLUENZA VACCINE  12/15/2020   URINE MICROALBUMIN  01/07/2021    Past Medical History:  Diagnosis Date   Allergy    Diabetes mellitus without complication (HCC)    Hepatic adenoma    benign; related to birth control   History of chicken pox    Hyperlipidemia    Hypertension    Hypothyroid    Iron deficiency anemia 06/20/2019    Past Surgical History:  Procedure Laterality Date   WISDOM TOOTH EXTRACTION  2005    Family History  Problem Relation Age of Onset    Hyperlipidemia Mother    Depression Mother    Hyperlipidemia Father    Hypertension Father    Hypothyroidism Maternal Grandmother    Heart disease Maternal Grandfather        MI, stent, CAD   Hypertension Maternal Grandfather    Hyperlipidemia Maternal Grandfather    COPD Paternal Grandmother    Hypertension Paternal Grandmother    Hyperlipidemia Paternal Grandmother    Heart disease Paternal Grandmother    AAA (abdominal aortic aneurysm) Paternal Grandfather    Dementia Paternal Grandfather    Heart disease Paternal Grandfather     Social History   Socioeconomic History   Marital status: Single    Spouse name: Not on file   Number of children: Not on file   Years of education: Not on file   Highest education level: Not on file  Occupational History   Not on file  Tobacco Use   Smoking status: Never   Smokeless tobacco: Never  Vaping Use   Vaping Use: Never used  Substance and Sexual Activity   Alcohol use: Yes    Alcohol/week: 0.0 standard drinks    Comment: occasional   Drug use: No   Sexual activity: Yes    Partners: Male    Birth control/protection: Condom    Comment: INTERCOURSE AGE 12, SEXUAL PARTNERS 5  Other Topics Concern  Not on file  Social History Narrative   Enjoys family friends, sleeping, reading, relaxing   2 cats "Bruce and Bella"   No children   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 [place]   Social Determinants of Radio broadcast assistant Strain: Not on [employer] Insecurity: Not on file  Transportation Needs: Not on file  Physical Activity: Not on file  Stress: Not on file  Social Connections: Not on file  Intimate Partner Violence: Not on file    Outpatient Medications Prior to Visit  Medication Sig Dispense Refill   Ferrous Sulfate (IRON) 325 (65 Fe) MG TABS 1 tablet by mouth twice daily 30 tablet 0   glucose blood (FREESTYLE TEST STRIPS) test strip Use as instructed (Patient taking differently: Use as instructed to check  blood sugar 1 to 2 times daily.) 100 each 12   glucose monitoring kit (FREESTYLE) monitoring kit 1 each by Does not apply route as needed for other. 1 each 0   Lancets (FREESTYLE) lancets Use as instructed 100 each 12   medroxyPROGESTERone (PROVERA) 10 MG tablet Take for 5 days Every 2 mo if no cycle 15 tablet 1   metFORMIN (GLUCOPHAGE) 1000 MG tablet TAKE 1 TABLET (1,000 MG TOTAL) BY MOUTH 2 (TWO) TIMES DAILY WITH A MEAL. 180 tablet 1   Multiple Vitamins-Iron (MULTI-VITAMIN/IRON) TABS Take 1 tablet by mouth daily.  0   omeprazole (PRILOSEC) 20 MG capsule TAKE 1 CAPSULE (20 MG TOTAL) BY MOUTH DAILY. 90 capsule 1   vitamin B-12 (CYANOCOBALAMIN) 1000 MCG tablet Take 1,000 mcg by mouth daily.     atorvastatin (LIPITOR) 80 MG tablet TAKE 1 TABLET (80 MG TOTAL) BY MOUTH DAILY. 90 tablet 0   hydrochlorothiazide (HYDRODIURIL) 25 MG tablet TAKE 1 TABLET (25 MG TOTAL) BY MOUTH DAILY. 90 tablet 1   levothyroxine (SYNTHROID) 50 MCG tablet TAKE 1 TABLET BY MOUTH ONCE DAILY BEFORE BREAKFAST 90 tablet 1   metoprolol succinate (TOPROL-XL) 25 MG 24 hr tablet TAKE 1 TABLET (25 MG TOTAL) BY MOUTH DAILY. 90 tablet 1   Omega-3 Fatty Acids (FISH OIL) 1000 MG CAPS Take 2,000 mg by mouth 2 (two) times daily.     spironolactone (ALDACTONE) 50 MG tablet TAKE 1 TABLET (50 MG TOTAL) BY MOUTH DAILY. 90 tablet 1   traZODone (DESYREL) 50 MG tablet TAKE 1/2-1 TABLET (25-50 MG TOTAL) BY MOUTH AT BEDTIME AS NEEDED FOR SLEEP. 135 tablet 1   No facility-administered medications prior to visit.    Allergies  Allergen Reactions   Amlodipine     flushing   Other     CINNAMON GUM--blisters.    ROS     Objective:    Physical Exam Constitutional:      General: She is not in acute distress.    Appearance: Normal appearance. She is not ill-appearing.  HENT:     Head: Normocephalic and atraumatic.     Right Ear: External ear normal.     Left Ear: External ear normal.  Eyes:     Extraocular Movements: Extraocular  movements intact.     Pupils: Pupils are equal, round, and reactive to light.  Cardiovascular:     Rate and Rhythm: Normal rate and regular rhythm.     Heart sounds: Normal heart sounds. No murmur heard.   No gallop.  Pulmonary:     Effort: Pulmonary effort is normal. No respiratory distress.     Breath sounds: Normal breath sounds. No  wheezing or rales.  Lymphadenopathy:     Cervical: No cervical adenopathy.  Skin:    General: Skin is warm and dry.  Neurological:     Mental Status: She is alert and oriented to person, place, and time.     Deep Tendon Reflexes:     Reflex Scores:      Achilles reflexes are 2+ on the right side and 2+ on the left side. Psychiatric:        Behavior: Behavior normal.        Judgment: Judgment normal.    BP (!) 125/55 (BP Location: Left Arm, Patient Position: Sitting, Cuff Size: Large)   Pulse 81   Temp 98.7 F (37.1 C) (Oral)   Resp 16   Ht 5' 7"  (1.702 m)   Wt 272 lb (123.4 kg)   SpO2 100%   BMI 42.60 kg/m  Wt Readings from Last 3 Encounters:  02/03/21 272 lb (123.4 kg)  07/08/20 269 lb (122 kg)  06/19/20 265 lb (120.2 kg)       Assessment & Plan:   Problem List Items Addressed This Visit       Unprioritized   Iron deficiency anemia    Lab Results  Component Value Date   WBC 8.9 07/10/2020   HGB 12.9 07/10/2020   HCT 38.8 07/10/2020   MCV 86.2 07/10/2020   PLT 277.0 07/10/2020        Relevant Orders   Iron (Completed)   Ferritin (Completed)   CBC with Differential/Platelet (Completed)   Hypothyroidism    Lab Results  Component Value Date   TSH 2.07 07/10/2020        Relevant Medications   levothyroxine (SYNTHROID) 50 MCG tablet   metoprolol succinate (TOPROL-XL) 25 MG 24 hr tablet   Other Relevant Orders   TSH (Completed)   Hyperlipidemia    Lab Results  Component Value Date   CHOL 184 01/08/2020   HDL 43 (L) 01/08/2020   LDLCALC 89 01/08/2020   LDLDIRECT 125.0 06/19/2019   TRIG 390 (H) 01/08/2020    CHOLHDL 4.3 01/08/2020        Relevant Medications   spironolactone (ALDACTONE) 50 MG tablet   atorvastatin (LIPITOR) 80 MG tablet   hydrochlorothiazide (HYDRODIURIL) 25 MG tablet   metoprolol succinate (TOPROL-XL) 25 MG 24 hr tablet   Other Relevant Orders   Lipid panel (Completed)   HTN (hypertension)    BP Readings from Last 3 Encounters:  02/03/21 (!) 125/55  07/08/20 123/83  06/19/20 130/82        Relevant Medications   spironolactone (ALDACTONE) 50 MG tablet   atorvastatin (LIPITOR) 80 MG tablet   hydrochlorothiazide (HYDRODIURIL) 25 MG tablet   metoprolol succinate (TOPROL-XL) 25 MG 24 hr tablet   Hepatic adenoma    Stable. Sees Dawn Drazek NP      Diabetes type 2, controlled ([place])    Lab Results  Component Value Date   HGBA1C 6.3 07/10/2020   HGBA1C 6.0 (H) 01/08/2020   HGBA1C 6.7 (H) 06/19/2019   Lab Results  Component Value Date   MICROALBUR 0.4 01/08/2020   LDLCALC 89 01/08/2020   CREATININE 0.64 07/10/2020  Stable.  Continue metformin.       Relevant Medications   atorvastatin (LIPITOR) 80 MG tablet   Other Relevant Orders   Hemoglobin A1c (Completed)   Comp Met (CMET) (Completed)   Other Visit Diagnoses     Need for hepatitis C screening test    -  Primary  Relevant Orders   Hepatitis C Antibody (Completed)   Hirsutism       Relevant Medications   spironolactone (ALDACTONE) 50 MG tablet      Meds ordered this encounter  Medications   spironolactone (ALDACTONE) 50 MG tablet    Sig: TAKE 1 TABLET (50 MG TOTAL) BY MOUTH DAILY.    Dispense:  90 tablet    Refill:  1    Order Specific Question:   Supervising Provider    Answer:   Penni Homans A [[id number]]   atorvastatin (LIPITOR) 80 MG tablet    Sig: TAKE 1 TABLET (80 MG TOTAL) BY MOUTH DAILY.    Dispense:  90 tablet    Refill:  1    Order Specific Question:   Supervising Provider    Answer:   Penni Homans A [[id number]]   hydrochlorothiazide (HYDRODIURIL) 25 MG tablet    Sig: TAKE 1 TABLET  (25 MG TOTAL) BY MOUTH DAILY.    Dispense:  90 tablet    Refill:  1    Order Specific Question:   Supervising Provider    Answer:   Penni Homans A [[id number]]   levothyroxine (SYNTHROID) 50 MCG tablet    Sig: TAKE 1 TABLET BY MOUTH ONCE DAILY BEFORE BREAKFAST    Dispense:  90 tablet    Refill:  1    Order Specific Question:   Supervising Provider    Answer:   Penni Homans A [[id number]]   metoprolol succinate (TOPROL-XL) 25 MG 24 hr tablet    Sig: TAKE 1 TABLET (25 MG TOTAL) BY MOUTH DAILY.    Dispense:  90 tablet    Refill:  1    Order Specific Question:   Supervising Provider    Answer:   Penni Homans A [[id number]]   traZODone (DESYREL) 50 MG tablet    Sig: Take 1 tablet (50 mg total) by mouth at bedtime.    Dispense:  90 tablet    Refill:  1    Order Specific Question:   Supervising Provider    Answer:   Penni Homans A [[id number]]    I, Debbrah Alar, NP, personally preformed the services described in this documentation.  All medical record entries made by the scribe were at my direction and in my presence.  I have reviewed the chart and discharge instructions (if applicable) and agree that the record reflects my personal performance and is accurate and complete. 02/03/2021  I,Kristy Chung,acting as a Education administrator for Nance Pear, NP.,have documented all relevant documentation on the behalf of Nance Pear, NP,as directed by  Nance Pear, NP while in the presence of Nance Pear, NP.  Nance Pear, NP

## 2021-02-03 NOTE — Assessment & Plan Note (Signed)
Lab Results  Component Value Date   TSH 2.07 07/10/2020

## 2021-02-03 NOTE — Assessment & Plan Note (Signed)
BP Readings from Last 3 Encounters:  02/03/21 (!) 125/55  07/08/20 123/83  06/19/20 130/82

## 2021-02-03 NOTE — Patient Instructions (Signed)
Please complete lab work prior to leaving.   

## 2021-02-03 NOTE — Assessment & Plan Note (Signed)
Lab Results  Component Value Date   WBC 8.9 07/10/2020   HGB 12.9 07/10/2020   HCT 38.8 07/10/2020   MCV 86.2 07/10/2020   PLT 277.0 07/10/2020

## 2021-02-03 NOTE — Assessment & Plan Note (Signed)
Lab Results  Component Value Date   CHOL 184 01/08/2020   HDL 43 (L) 01/08/2020   LDLCALC 89 01/08/2020   LDLDIRECT 125.0 06/19/2019   TRIG 390 (H) 01/08/2020   CHOLHDL 4.3 01/08/2020

## 2021-02-04 LAB — COMPREHENSIVE METABOLIC PANEL
ALT: 65 U/L — ABNORMAL HIGH (ref 0–35)
AST: 41 U/L — ABNORMAL HIGH (ref 0–37)
Albumin: 4.6 g/dL (ref 3.5–5.2)
Alkaline Phosphatase: 99 U/L (ref 39–117)
BUN: 11 mg/dL (ref 6–23)
CO2: 22 mEq/L (ref 19–32)
Calcium: 9.9 mg/dL (ref 8.4–10.5)
Chloride: 100 mEq/L (ref 96–112)
Creatinine, Ser: 0.64 mg/dL (ref 0.40–1.20)
GFR: 114.72 mL/min (ref >60.00)
Glucose, Bld: 143 mg/dL — ABNORMAL HIGH (ref 70–99)
Potassium: 3.7 mEq/L (ref 3.5–5.1)
Sodium: 135 mEq/L (ref 135–145)
Total Bilirubin: 0.5 mg/dL (ref 0.2–1.2)
Total Protein: 7.4 g/dL (ref 6.0–8.3)

## 2021-02-04 LAB — FERRITIN: Ferritin: 47.6 ng/mL (ref 10.0–291.0)

## 2021-02-04 LAB — CBC WITH DIFFERENTIAL/PLATELET
Basophils Absolute: 0.1 10*3/uL (ref 0.0–0.1)
Basophils Relative: 0.5 % (ref 0.0–3.0)
Eosinophils Absolute: 0.2 10*3/uL (ref 0.0–0.7)
Eosinophils Relative: 1.7 % (ref 0.0–5.0)
HCT: 39.5 % (ref 36.0–46.0)
Hemoglobin: 13.2 g/dL (ref 12.0–15.0)
Lymphocytes Relative: 29.8 % (ref 12.0–46.0)
Lymphs Abs: 3.3 10*3/uL (ref 0.7–4.0)
MCHC: 33.4 g/dL (ref 30.0–36.0)
MCV: 87.1 fl (ref 78.0–100.0)
Monocytes Absolute: 0.5 10*3/uL (ref 0.1–1.0)
Monocytes Relative: 4.3 % (ref 3.0–12.0)
Neutro Abs: 7 10*3/uL (ref 1.4–7.7)
Neutrophils Relative %: 63.7 % (ref 43.0–77.0)
Platelets: 309 10*3/uL (ref 150.0–400.0)
RBC: 4.53 Mil/uL (ref 3.87–5.11)
RDW: 14.8 % (ref 11.5–15.5)
WBC: 11.1 10*3/uL — ABNORMAL HIGH (ref 4.0–10.5)

## 2021-02-04 LAB — LDL CHOLESTEROL, DIRECT: Direct LDL: 105 mg/dL

## 2021-02-04 LAB — LIPID PANEL
Cholesterol: 186 mg/dL (ref 0–200)
HDL: 42.8 mg/dL (ref >39.00)
Total CHOL/HDL Ratio: 4
Triglycerides: 511 mg/dL — ABNORMAL HIGH (ref 0.0–149.0)

## 2021-02-04 LAB — IRON: Iron: 42 ug/dL (ref 42–145)

## 2021-02-04 LAB — TSH: TSH: 1.51 u[IU]/mL (ref 0.35–5.50)

## 2021-02-04 LAB — HEPATITIS C ANTIBODY
Hepatitis C Ab: NONREACTIVE
SIGNAL TO CUT-OFF: 0.01 (ref <1.00)

## 2021-02-04 LAB — HEMOGLOBIN A1C: Hgb A1c MFr Bld: 6.7 % — ABNORMAL HIGH (ref 4.6–6.5)

## 2021-02-05 ENCOUNTER — Other Ambulatory Visit: Payer: Self-pay | Admitting: Family

## 2021-02-05 ENCOUNTER — Encounter: Payer: Self-pay | Admitting: Family

## 2021-02-05 MED ORDER — FISH OIL 1000 MG PO CAPS: 3.0000 | ORAL_CAPSULE | Freq: Two times a day (BID) | ORAL | 0 refills | Status: AC

## 2021-02-06 ENCOUNTER — Ambulatory Visit: Payer: 59 | Admitting: Family

## 2021-03-03 ENCOUNTER — Other Ambulatory Visit: Payer: Self-pay | Admitting: Nurse Practitioner

## 2021-03-03 DIAGNOSIS — D134 Benign neoplasm of liver: Secondary | ICD-10-CM

## 2021-03-21 ENCOUNTER — Ambulatory Visit
Admission: RE | Admit: 2021-03-21 | Discharge: 2021-03-21 | Disposition: A | Discharge status: Home / Self Care | Payer: 59 | Source: Ambulatory Visit | Attending: Nurse Practitioner | Admitting: Nurse Practitioner

## 2021-03-21 DIAGNOSIS — D134 Benign neoplasm of liver: Secondary | ICD-10-CM | POA: Diagnosis not present

## 2021-03-21 DIAGNOSIS — K760 Fatty (change of) liver, not elsewhere classified: Secondary | ICD-10-CM | POA: Diagnosis not present

## 2021-03-21 DIAGNOSIS — R160 Hepatomegaly, not elsewhere classified: Secondary | ICD-10-CM | POA: Diagnosis not present

## 2021-03-21 MED ORDER — GADOBENATE DIMEGLUMINE 529 MG/ML IV SOLN
20.0000 mL | Freq: Once | INTRAVENOUS | Status: AC | PRN
  Administered 2021-03-21: 20 mL via INTRAVENOUS

## 2021-04-02 DIAGNOSIS — K760 Fatty (change of) liver, not elsewhere classified: Secondary | ICD-10-CM | POA: Diagnosis not present

## 2021-04-02 DIAGNOSIS — D134 Benign neoplasm of liver: Secondary | ICD-10-CM | POA: Diagnosis not present

## 2021-04-10 ENCOUNTER — Other Ambulatory Visit (HOSPITAL_BASED_OUTPATIENT_CLINIC_OR_DEPARTMENT_OTHER): Payer: Self-pay

## 2021-04-15 ENCOUNTER — Other Ambulatory Visit (HOSPITAL_BASED_OUTPATIENT_CLINIC_OR_DEPARTMENT_OTHER): Payer: Self-pay

## 2021-04-16 ENCOUNTER — Other Ambulatory Visit (HOSPITAL_BASED_OUTPATIENT_CLINIC_OR_DEPARTMENT_OTHER): Payer: Self-pay

## 2021-05-01 ENCOUNTER — Other Ambulatory Visit (HOSPITAL_BASED_OUTPATIENT_CLINIC_OR_DEPARTMENT_OTHER): Payer: Self-pay

## 2021-07-02 ENCOUNTER — Ambulatory Visit: Payer: 59 | Admitting: Nurse Practitioner

## 2021-07-26 ENCOUNTER — Other Ambulatory Visit: Payer: Self-pay | Admitting: Family

## 2021-07-27 ENCOUNTER — Other Ambulatory Visit (HOSPITAL_BASED_OUTPATIENT_CLINIC_OR_DEPARTMENT_OTHER): Payer: Self-pay

## 2021-07-27 MED ORDER — METFORMIN HCL 1000 MG PO TABS
ORAL_TABLET | Freq: Two times a day (BID) | ORAL | 0 refills | Status: DC
  Filled 2021-07-27: qty 60, 30d supply, fill #0

## 2021-07-28 ENCOUNTER — Encounter: Payer: Self-pay | Admitting: Nurse Practitioner

## 2021-07-28 ENCOUNTER — Other Ambulatory Visit: Payer: Self-pay

## 2021-07-28 ENCOUNTER — Ambulatory Visit (INDEPENDENT_AMBULATORY_CARE_PROVIDER_SITE_OTHER): Payer: 59 | Admitting: Nurse Practitioner

## 2021-07-28 VITALS — BP 120/84 | Ht 67.0 in | Wt 276.0 lb

## 2021-07-28 DIAGNOSIS — Z01419 Encounter for gynecological examination (general) (routine) without abnormal findings: Secondary | ICD-10-CM | POA: Diagnosis not present

## 2021-07-28 NOTE — Progress Notes (Signed)
? ?  Kristy Chung [birth year] [id number] ? ? ?History:  36 y.o. G0 presents for annual exam. Monthly cycles. History of irregular cycles but has not had to take Provera for cycle initiation in a couple of years. Cycles regulated after she stopped working nights and lost some weight. Normal pap history. DM, HTN, and hypothyroidism managed by PCP. History of liver adenoma that decreased in size after stopping OCPs. No contraception, OK with pregnancy.  ? ?Gynecologic History ?Patient's last menstrual period was 07/18/2021. ?Period Cycle (Days): 28 ?Period Duration (Days): 6 ?Period Pattern: Regular ?Menstrual Flow: Moderate, Heavy (2 heavy days, then moderate) ?Dysmenorrhea: (!) Moderate ?Contraception/Family planning: none ?Sexually active: Yes ? ?Health Maintenance ?Last Pap: 06/13/2018. Results were: Normal, 5-year repeat ?Last mammogram: Not indicated ?Last colonoscopy: Not indicated ?Last Dexa: Not indicated ? ?Past medical history, past surgical history, family history and social history were all reviewed and documented in the EPIC chart. 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 ? ?ROS:  A ROS was performed and pertinent positives and negatives are included. ? ?Exam: ? ?Vitals:  ? 07/28/21 1550  ?BP: 120/84  ?Weight: 276 lb (125.2 kg)  ?Height: '5\' 7"'$  (1.702 m)  ? ? ?Body mass index is 43.23 kg/m?. ? ?General appearance:  Normal ?Thyroid:  Symmetrical, normal in size, without palpable masses or nodularity. ?Respiratory ? Auscultation:  Clear without wheezing or rhonchi ?Cardiovascular ? Auscultation:  Regular rate, without rubs, murmurs or gallops ? Edema/varicosities:  Not grossly evident ?Abdominal ? Soft,nontender, without masses, guarding or rebound. ? Liver/spleen:  No organomegaly noted ? Hernia:  None appreciated ? Skin ? Inspection:  Grossly normal ?  ?Breasts: Examined lying and sitting.  ? Right: Without masses, retractions, discharge or axillary adenopathy. ? ? Left: Without masses,  retractions, discharge or axillary adenopathy. ?Genitourinary  ? Inguinal/mons:  Normal without inguinal adenopathy ? External genitalia:  Normal appearing vulva with no masses, tenderness, or lesions ? BUS/Urethra/Skene's glands:  Normal ? Vagina:  Normal appearing with normal color and discharge, no lesions ? Cervix:  Normal appearing without discharge or lesions ? Uterus:  Difficult to palpate due to body habitus  but no gross masses or tenderness ? Adnexa/parametria:   ?  Rt: Normal in size, without masses or tenderness. ?  Lt: Normal in size, without masses or tenderness. ? Anus and perineum: Normal ? Digital rectal exam: Not indicated ? ?Patient informed chaperone available to be present for breast and pelvic exam. Patient has requested no chaperone to be present. Patient has been advised what will be completed during breast and pelvic exam.  ? ?Assessment/Plan:  36 y.o. G0 for annual exam.  ? ?Well female exam with routine gynecological exam - Education provided on SBEs, importance of preventative screenings, current guidelines, high calcium diet, regular exercise, and multivitamin daily. Labs with PCP.  ? ?Screening for cervical cancer - Normal Pap history.  Will repeat at 5-year interval per guidelines. ? ?Follow up in 1 year for annual.  ? ? ? ? ?Emmet, 4:10 PM 07/28/2021 ? ?

## 2021-08-09 ENCOUNTER — Other Ambulatory Visit: Payer: Self-pay | Admitting: Family

## 2021-08-09 MED ORDER — OMEPRAZOLE 20 MG PO CPDR
DELAYED_RELEASE_CAPSULE | Freq: Every day | ORAL | 0 refills | Status: DC
  Filled 2021-08-09: qty 90, 90d supply, fill #0

## 2021-08-10 ENCOUNTER — Other Ambulatory Visit (HOSPITAL_BASED_OUTPATIENT_CLINIC_OR_DEPARTMENT_OTHER): Payer: Self-pay

## 2021-08-14 IMAGING — MR MR ABDOMEN WO/W CM
16 series · 48 of 48 positions shown · IV contrast (EOVIST)
Comparison: MR abdomen, 03/08/2019, 03/16/2018, 03/12/2017

CLINICAL DATA: Characterize liver lesions, suspected hepatic
adenomas

EXAM:
MRI ABDOMEN WITHOUT AND WITH CONTRAST
TECHNIQUE: Multiplanar multisequence MR imaging of the abdomen was performed
both before and after the administration of intravenous contrast.
CONTRAST:  10mL EOVIST GADOXETATE DISODIUM 0.25 MOL/L IV SOLN

[Series 4: T2 · coronal · 5.0mm · 1.64mm/px · 1 of 38 slices shown (1 of 3)]
[im 1/38]
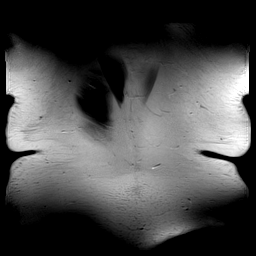

[Series 5: T1 · axial · 3.2mm · 1.25mm/px · z∈[-84,+194]mm · 6 of 176 slices shown]
[im 1/176]
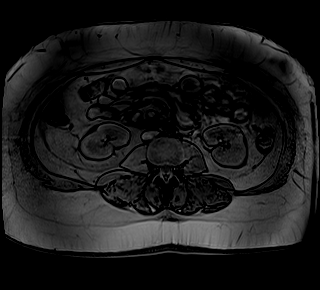
[im 36/176]
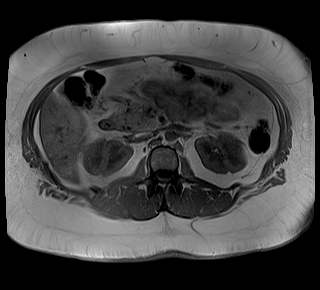
[im 71/176]
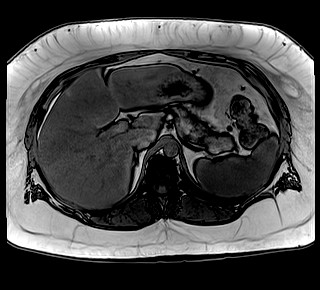
[im 106/176]
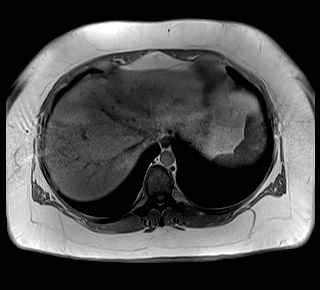
[im 141/176]
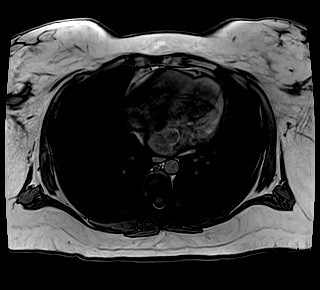
[im 176/176]
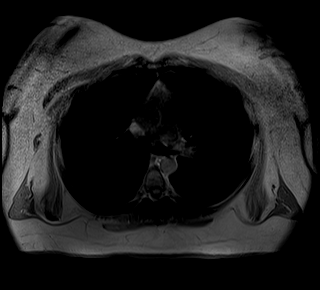

[Series 6: T1 dynamic · axial · non-contrast · 3.5mm · 1.25mm/px · z∈[-130,+146]mm · 3 of 80 slices shown]
[im 1/80]
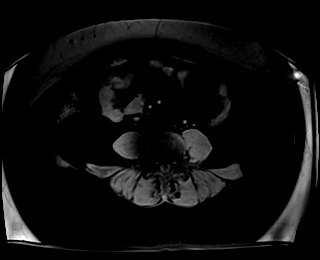
[im 40/80]
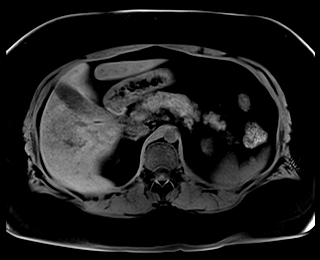
[im 80/80]
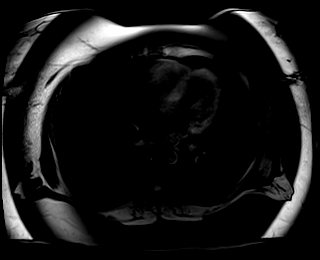

[Series 7: T1 dynamic post-contrast · axial · 3.5mm · 1.25mm/px · z∈[-130,+146]mm · 3 of 80 slices shown (1 of 9)]
[im 1/80]
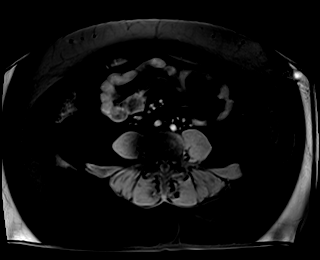
[im 40/80]
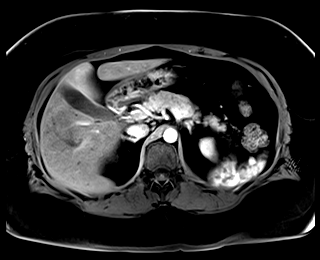
[im 80/80]
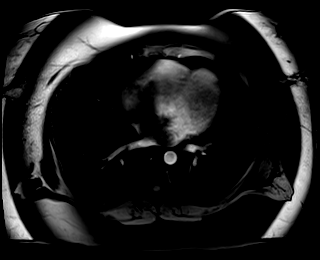

[Series 8: T1 dynamic post-contrast · axial · 3.5mm · 1.25mm/px · z∈[-130,+146]mm · 3 of 80 slices shown (2 of 9)]
[im 1/80]
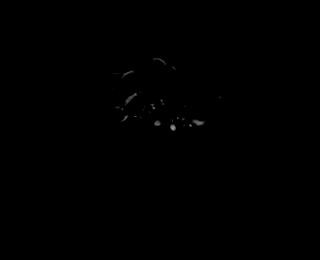
[im 40/80]
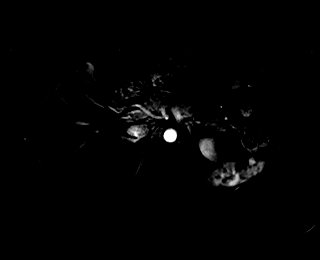
[im 80/80]
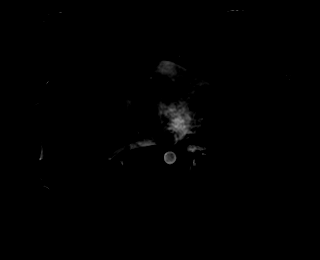

[Series 9: T1 dynamic post-contrast · axial · 3.5mm · 1.25mm/px · z∈[-130,+146]mm · 3 of 80 slices shown (3 of 9)]
[im 1/80]
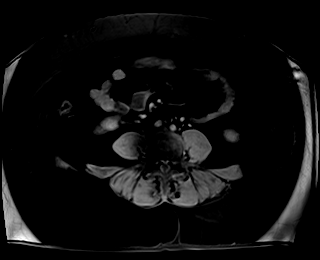
[im 40/80]
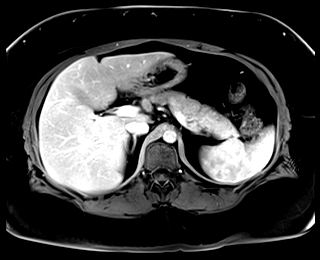
[im 80/80]
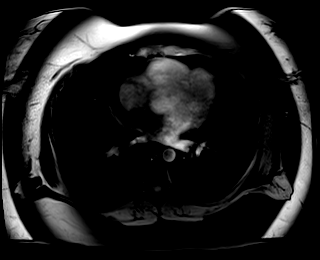

[Series 10: T1 dynamic post-contrast · axial · 3.5mm · 1.25mm/px · z∈[-130,+146]mm · 3 of 80 slices shown (4 of 9)]
[im 1/80]
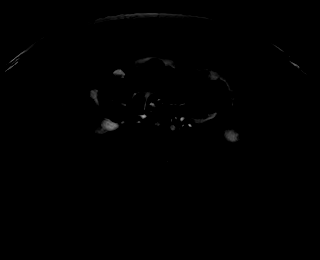
[im 40/80]
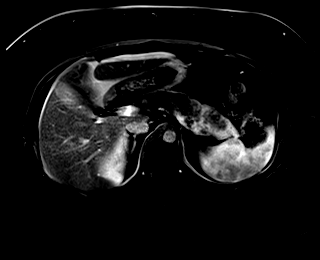
[im 80/80]
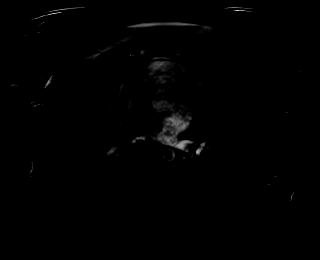

[Series 11: T1 dynamic post-contrast · axial · 3.5mm · 1.25mm/px · z∈[-130,+146]mm · 3 of 80 slices shown (5 of 9)]
[im 1/80]
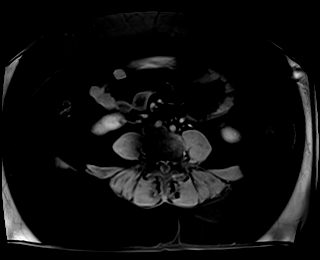
[im 40/80]
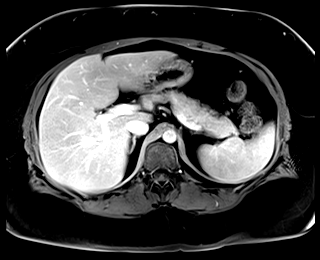
[im 80/80]
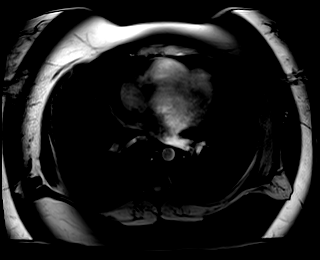

[Series 12: T1 dynamic post-contrast · axial · 3.5mm · 1.25mm/px · z∈[-130,+146]mm · 3 of 80 slices shown (6 of 9)]
[im 1/80]
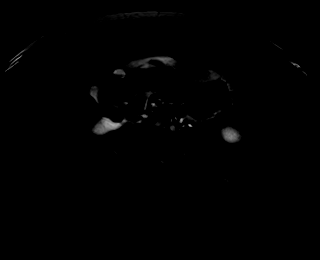
[im 40/80]
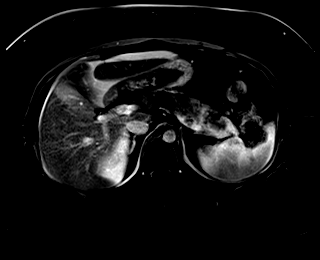
[im 80/80]
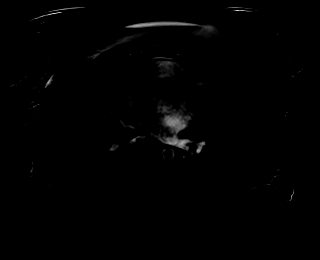

[Series 13: T1 dynamic post-contrast · coronal · 3.0mm · 1.25mm/px · 3 of 72 slices shown (7 of 9)]
[im 1/72]
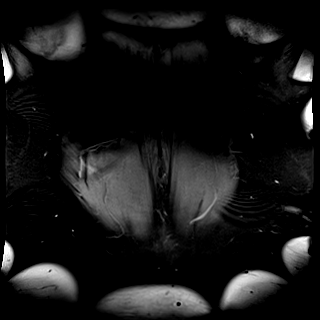
[im 36/72]
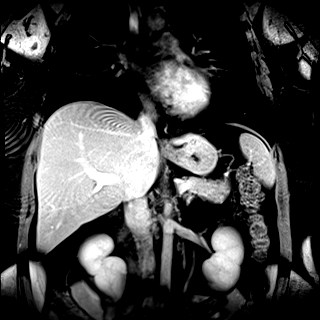
[im 72/72]
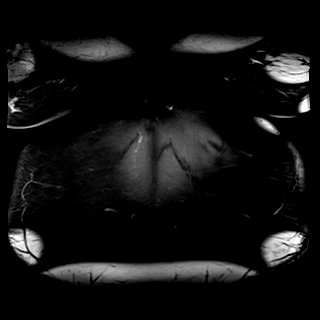

[Series 14: T2 · axial · 5.0mm · 1.48mm/px · z∈[-98,+190]mm · 2 of 49 slices shown (2 of 3)]
[im 1/49]
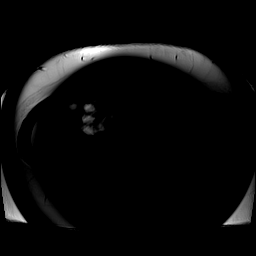
[im 49/49]
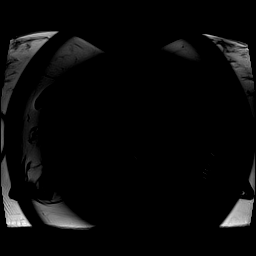

[Series 15: DWI · axial · 5.0mm · 1.49mm/px · z∈[-100,+194]mm · 6 of 150 slices shown (1 of 2)]
[im 1/150]
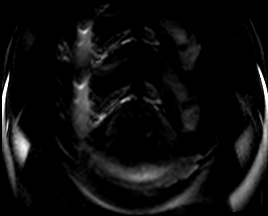
[im 30/150]
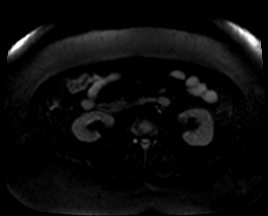
[im 60/150]
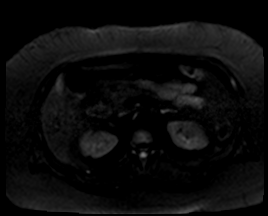
[im 90/150]
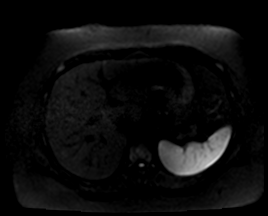
[im 120/150]
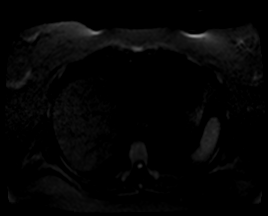
[im 150/150]
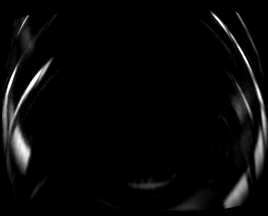

[Series 16: DWI · axial · 5.0mm · 1.49mm/px · z∈[-100,+194]mm · 2 of 50 slices shown (2 of 2)]
[im 1/50]
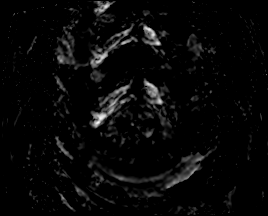
[im 50/50]
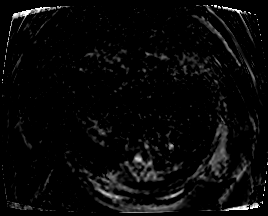

[Series 17: T2 · axial · 6.0mm · 1.25mm/px · 1 of 40 slices shown (3 of 3)]
[im 1/40]
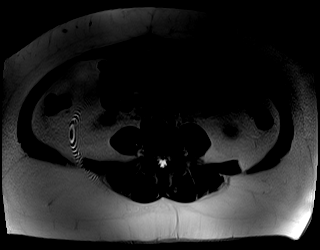

[Series 18: T1 dynamic post-contrast · axial · 3.5mm · 1.25mm/px · z∈[-130,+146]mm · 3 of 80 slices shown (8 of 9)]
[im 1/80]
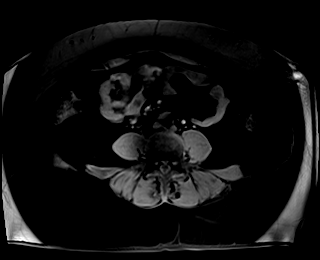
[im 40/80]
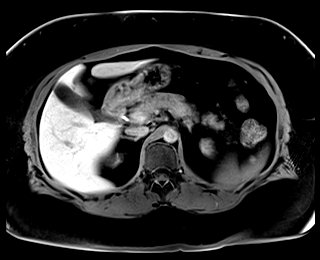
[im 80/80]
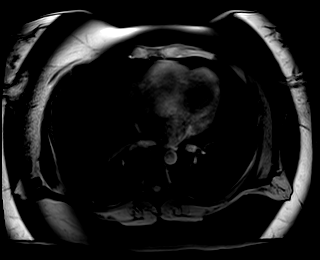

[Series 19: T1 dynamic post-contrast · axial · 3.5mm · 1.25mm/px · z∈[-130,+146]mm · 3 of 80 slices shown (9 of 9)]
[im 1/80]
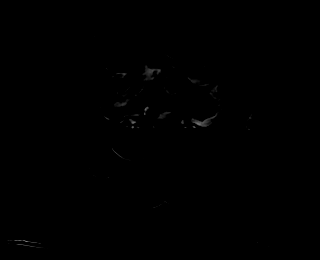
[im 40/80]
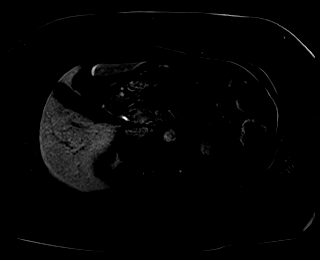
[im 80/80]
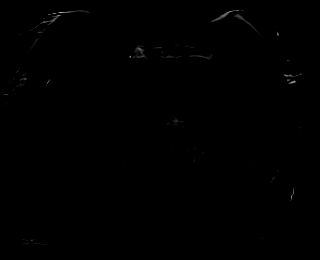

[48 of 48 positions shown; findings below may reference images not displayed]

FINDINGS: Lower chest: No acute findings.

Hepatobiliary: Mild hepatic steatosis. There are multiple
redemonstrated arterially hyperenhancing lesions throughout the
liver. One subcapsular lesion in the posterior liver dome, hepatic
segment VII, demonstrates intrinsic T1 hyperintensity, the remaining
lesions demonstrate no intrinsic signal abnormality. These lesions
demonstrate subtle hypoenhancement on delayed phase imaging. Largest
lesion in the posterior liver dome measures 2.2 x 1.4 cm, previously
2.4 x 1.5 cm (series 7, image 20). There is a single hypoenhancing
lesion of the anterior subcapsular left lobe of the liver, with a
distinct appearance compared to the remaining lesions, and this has
notably decreased in size over multiple prior examinations (series
11, image 36). No mass or other parenchymal abnormality identified.

Pancreas: No mass, inflammatory changes, or other parenchymal
abnormality identified.

Spleen:  Within normal limits in size and appearance.

Adrenals/Urinary Tract: No masses identified. No evidence of
hydronephrosis.

Stomach/Bowel: Visualized portions within the abdomen are
unremarkable.

Vascular/Lymphatic: No pathologically enlarged lymph nodes
identified. No abdominal aortic aneurysm demonstrated.

Other:  None.

Musculoskeletal: No suspicious bone lesions identified.
IMPRESSION: 1. Multiple redemonstrated arterially hyperenhancing lesions
throughout the liver. These may be slightly decreased in size
compared to prior examination and have definitively decreased in
size over multiple sequential prior examinations. These remain
consistent with hepatic adenomas. No new lesions.
2. A lesion of the anterior subcapsular left lobe of liver with a
distinct appearance is likely a small adenoma complicated by remote
prior hemorrhage, and has decreased in size over multiple prior
examinations.
3. Mild hepatic steatosis.

## 2021-08-24 ENCOUNTER — Other Ambulatory Visit (HOSPITAL_BASED_OUTPATIENT_CLINIC_OR_DEPARTMENT_OTHER): Payer: Self-pay

## 2021-09-01 ENCOUNTER — Ambulatory Visit: Payer: 59 | Admitting: Family

## 2021-09-01 VITALS — BP 133/87 | HR 88 | Temp 98.4°F | Resp 16 | Wt 274.0 lb

## 2021-09-01 DIAGNOSIS — D509 Iron deficiency anemia, unspecified: Secondary | ICD-10-CM | POA: Diagnosis not present

## 2021-09-01 DIAGNOSIS — I10 Essential (primary) hypertension: Secondary | ICD-10-CM

## 2021-09-01 DIAGNOSIS — E039 Hypothyroidism, unspecified: Secondary | ICD-10-CM

## 2021-09-01 DIAGNOSIS — E119 Type 2 diabetes mellitus without complications: Secondary | ICD-10-CM

## 2021-09-01 DIAGNOSIS — G4700 Insomnia, unspecified: Secondary | ICD-10-CM

## 2021-09-01 DIAGNOSIS — G43909 Migraine, unspecified, not intractable, without status migrainosus: Secondary | ICD-10-CM | POA: Diagnosis not present

## 2021-09-01 DIAGNOSIS — E785 Hyperlipidemia, unspecified: Secondary | ICD-10-CM

## 2021-09-01 NOTE — Assessment & Plan Note (Addendum)
BP Readings from Last 3 Encounters:  ?09/01/21 133/87  ?07/28/21 120/84  ?02/03/21 (!) 125/55  ? ?She is on aldactone for acne, bp looks good. Continue hctz. ?

## 2021-09-01 NOTE — Assessment & Plan Note (Signed)
Lab Results  ?Component Value Date  ? CHOL 186 02/03/2021  ? HDL 42.80 02/03/2021  ? [place] 89 01/08/2020  ? LDLDIRECT 105.0 02/03/2021  ? TRIG (H) 02/03/2021  ?  511.0 Triglyceride is over 400; calculations on Lipids are invalid.  ? CHOLHDL 4 02/03/2021  ? ?Trigs were high. Continue atorvastatin and fish oil. Will return fasting for lipid panel.  ?

## 2021-09-01 NOTE — Assessment & Plan Note (Signed)
Stable with nightly trazodone.  ?

## 2021-09-01 NOTE — Progress Notes (Signed)
? ?Subjective:  ? ?By signing my name below, I, Kristy Chung, attest that this documentation has been prepared under the direction and in the presence of Debbrah Alar NP, 09/01/2021 ? ? Patient ID: Kristy Chung, female    DOB: 04/28/1986, 36 y.o.   MRN: [id number] ? ?Chief Complaint  ?Patient presents with  ? Diabetes  ?  Here for follow up  ? Hypertension  ?  Here for follow up  ? ? ?HPI ?Patient is in today for an office visit. ? ?Refill - She is requesting a refill on all of her medications.  ? ?Migraine - She states that her last migraine was over a year ago. She had a headache last week but symptoms were resolved once she took her medications.  ? ?Sleep - Her sleep is improving but she has been experiencing stress due to her occupation. She has been reading or playing mindless games before sleeping. She is taking 50 MG of Trazodone.  ? ?Thyroid - She has been taking 50 MCG of Synthroid.  ?Lab Results  ?Component Value Date  ? TSH 1.51 02/03/2021  ? ? ?Blood Sugar - She has not been keeping up with her blood sugars. She has heard of Ozempic and is concerned about its side effects regarding her liver. She is currently taking 1000 MG of Metformin.  ?Lab Results  ?Component Value Date  ? HGBA1C 6.7 (H) 02/03/2021  ? ?Exercise - She has begun to exercise more frequently ? ?Diet - Her diet is improving, she has begun to meal prep. ?Wt Readings from Last 3 Encounters:  ?09/01/21 274 lb (124.3 kg)  ?07/28/21 276 lb (125.2 kg)  ?02/03/21 272 lb (123.4 kg)  ? ?Blood Pressure - As of today's visit, her blood pressure is fine. She is taking 50 MG of Aldactone and 25 MG of Hydrochlorothiazide. She notes that when she discontinues use, she develops acne.  ?BP Readings from Last 3 Encounters:  ?09/01/21 133/87  ?07/28/21 120/84  ?02/03/21 (!) 125/55  ? ?Cholesterol - She is currently taking 1000 MG of Fish Oil supplements and 80 MG of Lipitor. ?Lab Results  ?Component Value Date  ? CHOL 186 02/03/2021  ? HDL 42.80  02/03/2021  ? [place] 89 01/08/2020  ? LDLDIRECT 105.0 02/03/2021  ? TRIG (H) 02/03/2021  ?  511.0 Triglyceride is over 400; calculations on Lipids are invalid.  ? CHOLHDL 4 02/03/2021  ? ?Iron - She takes her multi - vitamin iron supplement daily.  ? ?Covid - 19 Bivalent Booster - She is not interested in receiving the COVID - 19 Bivalent booster.  ? ?Health Maintenance Due  ?Topic Date Due  ? URINE MICROALBUMIN  01/07/2021  ? HEMOGLOBIN A1C  08/03/2021  ? ? ?Past Medical History:  ?Diagnosis Date  ? Allergy   ? Diabetes mellitus without complication ([place])   ? Hepatic adenoma   ? benign; related to birth control  ? History of chicken pox   ? Hyperlipidemia   ? Hypertension   ? Hypothyroid   ? Iron deficiency anemia 06/20/2019  ? ? ?Past Surgical History:  ?Procedure Laterality Date  ? [place] EXTRACTION  2005  ? ? ?Family History  ?Problem Relation Age of Onset  ? Hyperlipidemia Mother   ? Depression Mother   ? Hyperlipidemia Father   ? Hypertension Father   ? Hypothyroidism Maternal Grandmother   ? Heart disease Maternal Grandfather   ?     MI, stent, CAD  ? Hypertension Maternal Grandfather   ?  Hyperlipidemia Maternal Grandfather   ? COPD Paternal Grandmother   ? Hypertension Paternal Grandmother   ? Hyperlipidemia Paternal Grandmother   ? Heart disease Paternal Grandmother   ? AAA (abdominal aortic aneurysm) Paternal Grandfather   ? Dementia Paternal Grandfather   ? Heart disease Paternal Grandfather   ? ? ?Social History  ? ?Socioeconomic History  ? Marital status: Single  ?  Spouse name: Not on file  ? Number of children: Not on file  ? Years of education: Not on file  ? Highest education level: Not on file  ?Occupational History  ? Not on file  ?Tobacco Use  ? Smoking status: Never  ? Smokeless tobacco: Never  ?Vaping Use  ? Vaping Use: Never used  ?Substance and Sexual Activity  ? Alcohol use: Yes  ?  Alcohol/week: 0.0 standard drinks  ?  Comment: occasional  ? Drug use: No  ? Sexual activity: Yes  ?   Partners: Male  ?  Birth control/protection: None  ?  Comment: INTERCOURSE AGE 64, SEXUAL PARTNERS 5  ?Other Topics Concern  ? Not on file  ?Social History Narrative  ? Enjoys family friends, sleeping, reading, relaxing  ? 2 cats "Bruce and Bella"  ? No children  ? Single  ? 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 Her family is in [place]  ? ?Social Determinants of Health  ? ?Financial Resource Strain: Not on file  ?Food Insecurity: Not on file  ?Transportation Needs: Not on file  ?Physical Activity: Not on file  ?Stress: Not on file  ?Social Connections: Not on file  ?Intimate Partner Violence: Not on file  ? ? ?Outpatient Medications Prior to Visit  ?Medication Sig Dispense Refill  ? atorvastatin (LIPITOR) 80 MG tablet TAKE 1 TABLET (80 MG TOTAL) BY MOUTH DAILY. 90 tablet 1  ? Ferrous Sulfate (IRON) 325 (65 Fe) MG TABS 1 tablet by mouth twice daily 30 tablet 0  ? glucose blood (FREESTYLE TEST STRIPS) test strip Use as instructed (Patient taking differently: Use as instructed to check blood sugar 1 to 2 times daily.) 100 each 12  ? glucose monitoring kit (FREESTYLE) monitoring kit 1 each by Does not apply route as needed for other. 1 each 0  ? hydrochlorothiazide (HYDRODIURIL) 25 MG tablet TAKE 1 TABLET (25 MG TOTAL) BY MOUTH DAILY. 90 tablet 1  ? Lancets (FREESTYLE) lancets Use as instructed 100 each 12  ? levothyroxine (SYNTHROID) 50 MCG tablet TAKE 1 TABLET BY MOUTH ONCE DAILY BEFORE BREAKFAST 90 tablet 1  ? metFORMIN (GLUCOPHAGE) 1000 MG tablet TAKE 1 TABLET (1,000 MG TOTAL) BY MOUTH 2 (TWO) TIMES DAILY WITH A MEAL. 60 tablet 0  ? metoprolol succinate (TOPROL-XL) 25 MG 24 hr tablet TAKE 1 TABLET (25 MG TOTAL) BY MOUTH DAILY. 90 tablet 1  ? Multiple Vitamins-Iron (MULTI-VITAMIN/IRON) TABS Take 1 tablet by mouth daily.  0  ? Omega-3 Fatty Acids (FISH OIL) 1000 MG CAPS Take 3 capsules (3,000 mg total) by mouth in the morning and at bedtime.  0  ? omeprazole (PRILOSEC) 20 MG capsule TAKE 1 CAPSULE (20 MG TOTAL) BY MOUTH DAILY. 90 capsule  0  ? spironolactone (ALDACTONE) 50 MG tablet TAKE 1 TABLET (50 MG TOTAL) BY MOUTH DAILY. 90 tablet 1  ? traZODone (DESYREL) 50 MG tablet Take 1 tablet (50 mg total) by mouth at bedtime. 90 tablet 1  ? vitamin B-12 (CYANOCOBALAMIN) 1000 MCG tablet Take 1,000 mcg by mouth daily.    ? ?No facility-administered medications prior to visit.  ? ? ?  Allergies  ?Allergen Reactions  ? Amlodipine   ?  flushing  ? Codeine   ?  Allscripts Description: Codeine DerivativesAllscripts Description: Codeine Sulfate TABSAllscripts Description: Codeine Phosphate Powder ?Allscripts Description: Codeine Phosphate Powder  ? Other   ?  CINNAMON GUM--blisters.  ? ? ?ROS ?See HPI ?   ?Objective:  ?  ?Physical Exam ?Constitutional:   ?   General: She is not in acute distress. ?   Appearance: She is not ill-appearing.  ?HENT:  ?   Head: Normocephalic and atraumatic.  ?   Right Ear: Tympanic membrane, ear canal and external ear normal.  ?   Left Ear: Tympanic membrane, ear canal and external ear normal.  ?   Mouth/Throat:  ?   Pharynx: No posterior oropharyngeal erythema.  ?Eyes:  ?   Extraocular Movements: Extraocular movements intact.  ?   Pupils: Pupils are equal, round, and reactive to light.  ?Cardiovascular:  ?   Rate and Rhythm: Normal rate and regular rhythm.  ?   Heart sounds: Normal heart sounds. No murmur heard. ?  No gallop.  ?Pulmonary:  ?   Effort: Pulmonary effort is normal. No respiratory distress.  ?   Breath sounds: Normal breath sounds. No wheezing or rales.  ?Skin: ?   General: Skin is warm and dry.  ?Neurological:  ?   Mental Status: She is oriented to person, place, and time.  ?Psychiatric:     ?   Mood and Affect: Mood normal.     ?   Behavior: Behavior normal.     ?   Judgment: Judgment normal.  ? ? ?BP 133/87 (BP Location: Left Arm, Patient Position: Sitting, Cuff Size: Large)   Pulse 88   Temp 98.4 ?F (36.9 ?C) (Oral)   Resp 16   Wt 274 lb (124.3 kg)   SpO2 100%   BMI 42.91 kg/m?  ?Wt Readings from Last 3  Encounters:  ?09/01/21 274 lb (124.3 kg)  ?07/28/21 276 lb (125.2 kg)  ?02/03/21 272 lb (123.4 kg)  ? ? ?   ?Assessment & Plan:  ? ?Problem List Items Addressed This Visit   ? ?  ? Unprioritized  ? Migraine headache  ?

## 2021-09-01 NOTE — Assessment & Plan Note (Signed)
Lab Results  ?Component Value Date  ? TSH 1.51 02/03/2021  ? ?Wt Readings from Last 3 Encounters:  ?09/01/21 274 lb (124.3 kg)  ?07/28/21 276 lb (125.2 kg)  ?02/03/21 272 lb (123.4 kg)  ? ?Clinically stable on synthroid 50 mcg.  Check follow up TSH.  ?

## 2021-09-01 NOTE — Assessment & Plan Note (Signed)
Lab Results  ?Component Value Date  ? HGBA1C 6.7 (H) 02/03/2021  ? HGBA1C 6.3 07/10/2020  ? HGBA1C 6.0 (H) 01/08/2020  ? ?Lab Results  ?Component Value Date  ? MICROALBUR 0.4 01/08/2020  ? [place] 89 01/08/2020  ? CREATININE 0.64 02/03/2021  ? ?Continue metformin '1000mg'$  bid. Continue exercise. Reinforced dietary changes.  ?

## 2021-09-01 NOTE — Assessment & Plan Note (Signed)
Overall stable.  Last migraine >1 year ago.  ?

## 2021-09-01 NOTE — Assessment & Plan Note (Signed)
Lab Results  ?Component Value Date  ? WBC 11.1 (H) 02/03/2021  ? HGB 13.2 02/03/2021  ? HCT 39.5 02/03/2021  ? MCV 87.1 02/03/2021  ? PLT 309.0 02/03/2021  ? ? ?

## 2021-09-04 ENCOUNTER — Other Ambulatory Visit (INDEPENDENT_AMBULATORY_CARE_PROVIDER_SITE_OTHER): Payer: 59

## 2021-09-04 ENCOUNTER — Telehealth: Payer: Self-pay | Admitting: Family

## 2021-09-04 DIAGNOSIS — E039 Hypothyroidism, unspecified: Secondary | ICD-10-CM

## 2021-09-04 DIAGNOSIS — D509 Iron deficiency anemia, unspecified: Secondary | ICD-10-CM | POA: Diagnosis not present

## 2021-09-04 DIAGNOSIS — E785 Hyperlipidemia, unspecified: Secondary | ICD-10-CM | POA: Diagnosis not present

## 2021-09-04 DIAGNOSIS — E119 Type 2 diabetes mellitus without complications: Secondary | ICD-10-CM

## 2021-09-04 LAB — CBC WITH DIFFERENTIAL/PLATELET
Basophils Absolute: 0 10*3/uL (ref 0.0–0.1)
Basophils Relative: 0.5 % (ref 0.0–3.0)
Eosinophils Absolute: 0.1 10*3/uL (ref 0.0–0.7)
Eosinophils Relative: 1.6 % (ref 0.0–5.0)
HCT: 39 % (ref 36.0–46.0)
Hemoglobin: 12.9 g/dL (ref 12.0–15.0)
Lymphocytes Relative: 32 % (ref 12.0–46.0)
Lymphs Abs: 3 10*3/uL (ref 0.7–4.0)
MCHC: 33.1 g/dL (ref 30.0–36.0)
MCV: 85.3 fl (ref 78.0–100.0)
Monocytes Absolute: 0.4 10*3/uL (ref 0.1–1.0)
Monocytes Relative: 4 % (ref 3.0–12.0)
Neutro Abs: 5.7 10*3/uL (ref 1.4–7.7)
Neutrophils Relative %: 61.9 % (ref 43.0–77.0)
Platelets: 276 10*3/uL (ref 150.0–400.0)
RBC: 4.58 Mil/uL (ref 3.87–5.11)
RDW: 14.9 % (ref 11.5–15.5)
WBC: 9.2 10*3/uL (ref 4.0–10.5)

## 2021-09-04 LAB — LIPID PANEL
Cholesterol: 160 mg/dL (ref 0–200)
HDL: 43.3 mg/dL (ref >39.00)
LDL Cholesterol: 81 mg/dL (ref 0–99)
NonHDL: 116.41
Total CHOL/HDL Ratio: 4
Triglycerides: 178 mg/dL — ABNORMAL HIGH (ref 0.0–149.0)
VLDL: 35.6 mg/dL (ref 0.0–40.0)

## 2021-09-04 LAB — COMPREHENSIVE METABOLIC PANEL
ALT: 74 U/L — ABNORMAL HIGH (ref 0–35)
AST: 46 U/L — ABNORMAL HIGH (ref 0–37)
Albumin: 4.2 g/dL (ref 3.5–5.2)
Alkaline Phosphatase: 90 U/L (ref 39–117)
BUN: 9 mg/dL (ref 6–23)
CO2: 24 mEq/L (ref 19–32)
Calcium: 9 mg/dL (ref 8.4–10.5)
Chloride: 101 mEq/L (ref 96–112)
Creatinine, Ser: 0.51 mg/dL (ref 0.40–1.20)
GFR: 120.68 mL/min (ref >60.00)
Glucose, Bld: 131 mg/dL — ABNORMAL HIGH (ref 70–99)
Potassium: 4 mEq/L (ref 3.5–5.1)
Sodium: 138 mEq/L (ref 135–145)
Total Bilirubin: 0.6 mg/dL (ref 0.2–1.2)
Total Protein: 6.5 g/dL (ref 6.0–8.3)

## 2021-09-04 LAB — MICROALBUMIN / CREATININE URINE RATIO
Creatinine,U: 216.7 mg/dL
Microalb Creat Ratio: 0.5 mg/g (ref 0.0–30.0)
Microalb, Ur: 1 mg/dL (ref 0.0–1.9)

## 2021-09-04 LAB — FERRITIN: Ferritin: 33.1 ng/mL (ref 10.0–291.0)

## 2021-09-04 LAB — HEMOGLOBIN A1C: Hgb A1c MFr Bld: 7.2 % — ABNORMAL HIGH (ref 4.6–6.5)

## 2021-09-04 LAB — IRON: Iron: 48 ug/dL (ref 42–145)

## 2021-09-04 LAB — TSH: TSH: 1.48 u[IU]/mL (ref 0.35–5.50)

## 2021-09-04 NOTE — Telephone Encounter (Signed)
See mychart.  

## 2021-09-06 ENCOUNTER — Other Ambulatory Visit: Payer: Self-pay | Admitting: Family

## 2021-09-07 ENCOUNTER — Other Ambulatory Visit (HOSPITAL_BASED_OUTPATIENT_CLINIC_OR_DEPARTMENT_OTHER): Payer: Self-pay

## 2021-09-07 ENCOUNTER — Other Ambulatory Visit: Payer: Self-pay | Admitting: Family

## 2021-09-07 MED ORDER — LEVOTHYROXINE SODIUM 50 MCG PO TABS
ORAL_TABLET | Freq: Every day | ORAL | 1 refills | Status: DC
  Filled 2021-09-07: qty 90, 90d supply, fill #0
  Filled 2021-12-01: qty 90, 90d supply, fill #1

## 2021-09-07 MED ORDER — METFORMIN HCL 1000 MG PO TABS
ORAL_TABLET | Freq: Two times a day (BID) | ORAL | 1 refills | Status: DC
  Filled 2021-09-07: qty 180, 90d supply, fill #0
  Filled 2021-12-01: qty 180, 90d supply, fill #1

## 2021-10-25 ENCOUNTER — Other Ambulatory Visit: Payer: Self-pay | Admitting: Family

## 2021-10-26 ENCOUNTER — Other Ambulatory Visit (HOSPITAL_BASED_OUTPATIENT_CLINIC_OR_DEPARTMENT_OTHER): Payer: Self-pay

## 2021-10-26 MED ORDER — METOPROLOL SUCCINATE ER 25 MG PO TB24
ORAL_TABLET | Freq: Every day | ORAL | 0 refills | Status: DC
  Filled 2021-10-26: qty 90, 90d supply, fill #0

## 2021-10-26 MED ORDER — OMEPRAZOLE 20 MG PO CPDR
DELAYED_RELEASE_CAPSULE | Freq: Every day | ORAL | 0 refills | Status: DC
  Filled 2021-10-26: qty 90, 90d supply, fill #0

## 2021-10-26 MED ORDER — TRAZODONE HCL 50 MG PO TABS
50.0000 mg | ORAL_TABLET | Freq: Every day | ORAL | 0 refills | Status: DC
  Filled 2021-10-26: qty 90, 90d supply, fill #0

## 2021-10-29 ENCOUNTER — Other Ambulatory Visit: Payer: Self-pay | Admitting: Family

## 2021-10-29 ENCOUNTER — Other Ambulatory Visit (HOSPITAL_BASED_OUTPATIENT_CLINIC_OR_DEPARTMENT_OTHER): Payer: Self-pay

## 2021-10-29 MED ORDER — ATORVASTATIN CALCIUM 80 MG PO TABS
80.0000 mg | ORAL_TABLET | Freq: Every day | ORAL | 0 refills | Status: DC
  Filled 2021-10-29: qty 90, 90d supply, fill #0

## 2021-11-06 ENCOUNTER — Other Ambulatory Visit (HOSPITAL_BASED_OUTPATIENT_CLINIC_OR_DEPARTMENT_OTHER): Payer: Self-pay

## 2021-11-24 ENCOUNTER — Other Ambulatory Visit: Payer: Self-pay | Admitting: Family

## 2021-11-24 DIAGNOSIS — L680 Hirsutism: Secondary | ICD-10-CM

## 2021-11-25 ENCOUNTER — Other Ambulatory Visit (HOSPITAL_BASED_OUTPATIENT_CLINIC_OR_DEPARTMENT_OTHER): Payer: Self-pay

## 2021-11-25 MED ORDER — SPIRONOLACTONE 50 MG PO TABS
ORAL_TABLET | Freq: Every day | ORAL | 1 refills | Status: DC
  Filled 2021-11-25: qty 90, 90d supply, fill #0
  Filled 2022-02-18: qty 90, 90d supply, fill #1

## 2021-11-25 MED ORDER — HYDROCHLOROTHIAZIDE 25 MG PO TABS
ORAL_TABLET | Freq: Every day | ORAL | 1 refills | Status: DC
  Filled 2021-11-25: qty 90, 90d supply, fill #0

## 2021-12-01 ENCOUNTER — Other Ambulatory Visit (HOSPITAL_BASED_OUTPATIENT_CLINIC_OR_DEPARTMENT_OTHER): Payer: Self-pay

## 2021-12-02 ENCOUNTER — Other Ambulatory Visit (HOSPITAL_BASED_OUTPATIENT_CLINIC_OR_DEPARTMENT_OTHER): Payer: Self-pay

## 2022-01-14 ENCOUNTER — Ambulatory Visit: Payer: 59 | Admitting: Family

## 2022-01-14 ENCOUNTER — Other Ambulatory Visit (HOSPITAL_BASED_OUTPATIENT_CLINIC_OR_DEPARTMENT_OTHER): Payer: Self-pay

## 2022-01-14 VITALS — BP 125/82 | HR 83 | Temp 97.5°F | Resp 16 | Wt 271.0 lb

## 2022-01-14 DIAGNOSIS — G4700 Insomnia, unspecified: Secondary | ICD-10-CM | POA: Diagnosis not present

## 2022-01-14 DIAGNOSIS — E119 Type 2 diabetes mellitus without complications: Secondary | ICD-10-CM | POA: Diagnosis not present

## 2022-01-14 DIAGNOSIS — E039 Hypothyroidism, unspecified: Secondary | ICD-10-CM

## 2022-01-14 DIAGNOSIS — D509 Iron deficiency anemia, unspecified: Secondary | ICD-10-CM | POA: Diagnosis not present

## 2022-01-14 DIAGNOSIS — K219 Gastro-esophageal reflux disease without esophagitis: Secondary | ICD-10-CM

## 2022-01-14 DIAGNOSIS — I10 Essential (primary) hypertension: Secondary | ICD-10-CM

## 2022-01-14 DIAGNOSIS — E785 Hyperlipidemia, unspecified: Secondary | ICD-10-CM

## 2022-01-14 MED ORDER — ATORVASTATIN CALCIUM 80 MG PO TABS
80.0000 mg | ORAL_TABLET | Freq: Every day | ORAL | 1 refills | Status: DC
  Filled 2022-01-14: qty 90, 90d supply, fill #0
  Filled 2022-04-22: qty 90, 90d supply, fill #1

## 2022-01-14 MED ORDER — METFORMIN HCL 1000 MG PO TABS
ORAL_TABLET | Freq: Two times a day (BID) | ORAL | 1 refills | Status: DC
  Filled 2022-01-14 – 2022-03-01 (×2): qty 180, 90d supply, fill #0

## 2022-01-14 MED ORDER — OMEPRAZOLE 20 MG PO CPDR
DELAYED_RELEASE_CAPSULE | Freq: Every day | ORAL | 1 refills | Status: DC
  Filled 2022-01-14: qty 90, 90d supply, fill #0
  Filled 2022-05-03: qty 90, 90d supply, fill #1

## 2022-01-14 MED ORDER — IRON 325 (65 FE) MG PO TABS: 1.0000 | ORAL_TABLET | Freq: Every day | ORAL | 0 refills | Status: AC

## 2022-01-14 MED ORDER — METOPROLOL SUCCINATE ER 25 MG PO TB24
ORAL_TABLET | Freq: Every day | ORAL | 1 refills | Status: DC
  Filled 2022-01-14: qty 90, 90d supply, fill #0
  Filled 2022-04-22: qty 90, 90d supply, fill #1

## 2022-01-14 MED ORDER — HYDROCHLOROTHIAZIDE 25 MG PO TABS
25.0000 mg | ORAL_TABLET | Freq: Every day | ORAL | 1 refills | Status: DC
  Filled 2022-01-14 – 2022-02-18 (×2): qty 90, 90d supply, fill #0
  Filled 2022-05-15: qty 90, 90d supply, fill #1

## 2022-01-14 MED ORDER — TRAZODONE HCL 50 MG PO TABS
50.0000 mg | ORAL_TABLET | Freq: Every day | ORAL | 1 refills | Status: DC
  Filled 2022-01-14: qty 90, 90d supply, fill #0
  Filled 2022-04-22: qty 90, 90d supply, fill #1

## 2022-01-14 NOTE — Progress Notes (Signed)
Subjective:     Patient ID: Kristy Chung, female    DOB: 1985-08-30, 36 y.o.   MRN: [id number]  Chief Complaint  Patient presents with   Diabetes    Here for follow up   Hypertension    Here for follow up    HPI Patient is in today for follow up.  DM2- 145 fasting 2 days in a row which is high for her.     Wt Readings from Last 3 Encounters:  01/14/22 271 lb (122.9 kg)  09/01/21 274 lb (124.3 kg)  07/28/21 276 lb (125.2 kg)    Lab Results  Component Value Date   HGBA1C 7.2 (H) 01/14/2022   HGBA1C 7.2 (H) 09/04/2021   HGBA1C 6.7 (H) 02/03/2021   Lab Results  Component Value Date   MICROALBUR 1.0 09/04/2021   LDLCALC 81 09/04/2021   CREATININE 0.60 01/14/2022   HTN- on aldactone,  hctz and toprol. BP Readings from Last 3 Encounters:  01/14/22 125/82  09/01/21 133/87  07/28/21 120/84   Hypothyroid- on synthroid 50 mcg Lab Results  Component Value Date   TSH 1.33 01/14/2022   Iron def anemia- She is taking one tablet once daily of iron.   GERD- stable as long as she takes her prilosec.    Hyperlipidemia- much improved last visit on atorvastatin/fish oil.  Lab Results  Component Value Date   CHOL 160 09/04/2021   HDL 43.30 09/04/2021   LDLCALC 81 09/04/2021   LDLDIRECT 105.0 02/03/2021   TRIG 178.0 (H) 09/04/2021   CHOLHDL 4 09/04/2021   Health Maintenance Due  Topic Date Due   COVID-19 Vaccine (3 - Pfizer series) 08/22/2019   INFLUENZA VACCINE  12/15/2021    Past Medical History:  Diagnosis Date   Allergy    Diabetes mellitus without complication ([facility])    Hepatic adenoma    benign; related to birth control   History of chicken pox    Hyperlipidemia    Hypertension    Hypothyroid    Iron deficiency anemia 06/20/2019    Past Surgical History:  Procedure Laterality Date   [place] TO[place] EXTRACTION  2005    Family History  Problem Relation Age of Onset   Hyperlipidemia Mother    Depression Mother    Hyperlipidemia Father     Hypertension Father    Hypothyroidism Maternal Grandmother    Heart disease Maternal Grandfather        MI, stent, CAD   Hypertension Maternal Grandfather    Hyperlipidemia Maternal Grandfather    COPD Paternal Grandmother    Hypertension Paternal Grandmother    Hyperlipidemia Paternal Grandmother    Heart disease Paternal Grandmother    AAA (abdominal aortic aneurysm) Paternal Grandfather    Dementia Paternal Grandfather    Heart disease Paternal Grandfather     Social History   Socioeconomic History   Marital status: Single    Spouse name: Not on file   Number of children: Not on file   Years of education: Not on file   Highest education level: Not on file  Occupational History   Not on file  Tobacco Use   Smoking status: Never   Smokeless tobacco: Never  Vaping Use   Vaping Use: Never used  Substance and Sexual Activity   Alcohol use: Yes    Alcohol/week: 0.0 standard drinks of alcohol    Comment: occasional   Drug use: No   Sexual activity: Yes    Partners: Male    Birth  control/protection: None    Comment: INTERCOURSE AGE 31, SEXUAL PARTNERS 5  Other Topics Concern   Not on file  Social History Narrative   Enjoys family friends, sleeping, reading, relaxing   2 cats "Bruce and Bella"   No children   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 [place]   Social Determinants of Radio broadcast assistant Strain: Not on file  Food Insecurity: Not on file  Transportation Needs: Not on file  Physical Activity: Not on file  Stress: Not on file  Social Connections: Not on file  Intimate Partner Violence: Not on file    Outpatient Medications Prior to Visit  Medication Sig Dispense Refill   glucose blood (FREESTYLE TEST STRIPS) test strip Use as instructed (Patient taking differently: Use as instructed to check blood sugar 1 to 2 times daily.) 100 each 12   glucose monitoring kit (FREESTYLE) monitoring kit 1 each by Does not apply route as needed for other. 1  each 0   Lancets (FREESTYLE) lancets Use as instructed 100 each 12   Multiple Vitamins-Iron (MULTI-VITAMIN/IRON) TABS Take 1 tablet by mouth daily.  0   Omega-3 Fatty Acids (FISH OIL) 1000 MG CAPS Take 3 capsules (3,000 mg total) by mouth in the morning and at bedtime.  0   spironolactone (ALDACTONE) 50 MG tablet TAKE 1 TABLET (50 MG TOTAL) BY MOUTH DAILY. 90 tablet 1   atorvastatin (LIPITOR) 80 MG tablet TAKE 1 TABLET (80 MG TOTAL) BY MOUTH DAILY. 90 tablet 0   Ferrous Sulfate (IRON) 325 (65 Fe) MG TABS 1 tablet by mouth twice daily 30 tablet 0   hydrochlorothiazide (HYDRODIURIL) 25 MG tablet TAKE 1 TABLET (25 MG TOTAL) BY MOUTH DAILY. 90 tablet 1   levothyroxine (SYNTHROID) 50 MCG tablet TAKE 1 TABLET BY MOUTH ONCE DAILY BEFORE BREAKFAST 90 tablet 1   metFORMIN (GLUCOPHAGE) 1000 MG tablet TAKE 1 TABLET (1,000 MG TOTAL) BY MOUTH 2 (TWO) TIMES DAILY WITH A MEAL. 180 tablet 1   metoprolol succinate (TOPROL-XL) 25 MG 24 hr tablet TAKE 1 TABLET (25 MG TOTAL) BY MOUTH DAILY. 90 tablet 0   omeprazole (PRILOSEC) 20 MG capsule TAKE 1 CAPSULE (20 MG TOTAL) BY MOUTH DAILY. 90 capsule 0   traZODone (DESYREL) 50 MG tablet Take 1 tablet (50 mg total) by mouth at bedtime. 90 tablet 0   vitamin B-12 (CYANOCOBALAMIN) 1000 MCG tablet Take 1,000 mcg by mouth daily.     No facility-administered medications prior to visit.    Allergies  Allergen Reactions   Amlodipine     flushing   Codeine     Allscripts Description: Codeine DerivativesAllscripts Description: Codeine Sulfate TABSAllscripts Description: Codeine Phosphate Powder Allscripts Description: Codeine Phosphate Powder   Other     CINNAMON GUM--blisters.    ROS See HPI    Objective:    Physical Exam Constitutional:      General: She is not in acute distress.    Appearance: Normal appearance. She is well-developed.  HENT:     Head: Normocephalic and atraumatic.     Right Ear: External ear normal.     Left Ear: External ear normal.  Eyes:      General: No scleral icterus. Neck:     Thyroid: No thyromegaly.  Cardiovascular:     Rate and Rhythm: Normal rate and regular rhythm.     Heart sounds: Normal heart sounds. No murmur heard. Pulmonary:     Effort: Pulmonary effort is normal. No  respiratory distress.     Breath sounds: Normal breath sounds. No wheezing.  Musculoskeletal:     Cervical back: Neck supple.  Skin:    General: Skin is warm and dry.  Neurological:     Mental Status: She is alert and oriented to person, place, and time.  Psychiatric:        Mood and Affect: Mood normal.        Behavior: Behavior normal.        Thought Content: Thought content normal.        Judgment: Judgment normal.     BP 125/82 (BP Location: Left Arm, Patient Position: Sitting, Cuff Size: Large)   Pulse 83   Temp (!) 97.5 F (36.4 C) (Oral)   Resp 16   Wt 271 lb (122.9 kg)   SpO2 99%   BMI 42.44 kg/m  Wt Readings from Last 3 Encounters:  01/14/22 271 lb (122.9 kg)  09/01/21 274 lb (124.3 kg)  07/28/21 276 lb (125.2 kg)       Assessment & Plan:   Problem List Items Addressed This Visit       Unprioritized   Iron deficiency anemia    Lab Results  Component Value Date   WBC 9.2 09/04/2021   HGB 12.9 09/04/2021   HCT 39.0 09/04/2021   MCV 85.3 09/04/2021   PLT 276.0 09/04/2021  Continue iron once daily.  H/H stable.       Relevant Medications   Ferrous Sulfate (IRON) 325 (65 Fe) MG TABS   Insomnia    Stable on trazodone. Continue same.      Hypothyroidism    Lab Results  Component Value Date   TSH 1.33 01/14/2022  Clinically stable on synthroid. Continue same.       Relevant Medications   metoprolol succinate (TOPROL-XL) 25 MG 24 hr tablet   Other Relevant Orders   TSH (Completed)   Hyperlipidemia    Lab Results  Component Value Date   CHOL 160 09/04/2021   HDL 43.30 09/04/2021   LDLCALC 81 09/04/2021   LDLDIRECT 105.0 02/03/2021   TRIG 178.0 (H) 09/04/2021   CHOLHDL 4 09/04/2021   This  SmartLink has not been configured with any valid records.  Last LDL at goal. Contnue atorvastatin 46m.       Relevant Medications   metoprolol succinate (TOPROL-XL) 25 MG 24 hr tablet   hydrochlorothiazide (HYDRODIURIL) 25 MG tablet   atorvastatin (LIPITOR) 80 MG tablet   HTN (hypertension)    BP Readings from Last 3 Encounters:  01/14/22 125/82  09/01/21 133/87  07/28/21 120/84  BP at goal. Continue metoprolol and aldactone.        Relevant Medications   metoprolol succinate (TOPROL-XL) 25 MG 24 hr tablet   hydrochlorothiazide (HYDRODIURIL) 25 MG tablet   atorvastatin (LIPITOR) 80 MG tablet   GERD (gastroesophageal reflux disease)    Stable. Continue prilosec.       Relevant Medications   omeprazole (PRILOSEC) 20 MG capsule   Diabetes type 2, controlled [place] [facility]    Obtain follow up A1C.  Continue metformin.       Relevant Medications   metFORMIN (GLUCOPHAGE) 1000 MG tablet   atorvastatin (LIPITOR) 80 MG tablet   Other Relevant Orders   Hemoglobin A1c (Completed)   Basic metabolic panel (Completed)    I have discontinued AEbony HailB. Hensarling's cyanocobalamin. I have also changed her Iron. Additionally, I am having her maintain her glucose monitoring kit, freestyle, glucose blood, Multi-Vitamin/Iron, Fish  Oil, spironolactone, traZODone, omeprazole, metoprolol succinate, metFORMIN, hydrochlorothiazide, and atorvastatin.  Meds ordered this encounter  Medications   Ferrous Sulfate (IRON) 325 (65 Fe) MG TABS    Sig: Take 1 tablet (325 mg total) by mouth daily. 1 tablet by mouth twice daily    Dispense:  30 tablet    Refill:  0    Order Specific Question:   Supervising Provider    Answer:   Penni Homans A [[id number]]   traZODone (DESYREL) 50 MG tablet    Sig: Take 1 tablet (50 mg total) by mouth at bedtime.    Dispense:  90 tablet    Refill:  1    Order Specific Question:   Supervising Provider    Answer:   Penni Homans A [[id number]]   omeprazole (PRILOSEC) 20 MG capsule     Sig: TAKE 1 CAPSULE (20 MG TOTAL) BY MOUTH DAILY.    Dispense:  90 capsule    Refill:  1    Order Specific Question:   Supervising Provider    Answer:   Penni Homans A [[id number]]   metoprolol succinate (TOPROL-XL) 25 MG 24 hr tablet    Sig: TAKE 1 TABLET (25 MG TOTAL) BY MOUTH DAILY.    Dispense:  90 tablet    Refill:  1    Order Specific Question:   Supervising Provider    Answer:   Penni Homans A [[id number]]   metFORMIN (GLUCOPHAGE) 1000 MG tablet    Sig: TAKE 1 TABLET (1,000 MG TOTAL) BY MOUTH 2 (TWO) TIMES DAILY WITH A MEAL.    Dispense:  180 tablet    Refill:  1    Requested drug refills are authorized, however, the patient needs further evaluation and/or laboratory testing before further refills are given. Ask her to make an appointment for this.    Order Specific Question:   Supervising Provider    Answer:   Penni Homans A [[id number]]   hydrochlorothiazide (HYDRODIURIL) 25 MG tablet    Sig: TAKE 1 TABLET (25 MG TOTAL) BY MOUTH DAILY.    Dispense:  90 tablet    Refill:  1    Order Specific Question:   Supervising Provider    Answer:   Penni Homans A [[id number]]   atorvastatin (LIPITOR) 80 MG tablet    Sig: TAKE 1 TABLET (80 MG TOTAL) BY MOUTH DAILY.    Dispense:  90 tablet    Refill:  1    Order Specific Question:   Supervising Provider    Answer:   Penni Homans A [[id number]]

## 2022-01-15 ENCOUNTER — Other Ambulatory Visit: Payer: Self-pay | Admitting: Family

## 2022-01-15 ENCOUNTER — Other Ambulatory Visit (HOSPITAL_BASED_OUTPATIENT_CLINIC_OR_DEPARTMENT_OTHER): Payer: Self-pay

## 2022-01-15 LAB — BASIC METABOLIC PANEL
BUN: 12 mg/dL (ref 6–23)
CO2: 24 mEq/L (ref 19–32)
Calcium: 10.3 mg/dL (ref 8.4–10.5)
Chloride: 101 mEq/L (ref 96–112)
Creatinine, Ser: 0.6 mg/dL (ref 0.40–1.20)
GFR: 115.75 mL/min (ref >60.00)
Glucose, Bld: 110 mg/dL — ABNORMAL HIGH (ref 70–99)
Potassium: 3.9 mEq/L (ref 3.5–5.1)
Sodium: 137 mEq/L (ref 135–145)

## 2022-01-15 LAB — TSH: TSH: 1.33 u[IU]/mL (ref 0.35–5.50)

## 2022-01-15 LAB — HEMOGLOBIN A1C: Hgb A1c MFr Bld: 7.2 % — ABNORMAL HIGH (ref 4.6–6.5)

## 2022-01-15 MED ORDER — LEVOTHYROXINE SODIUM 50 MCG PO TABS
ORAL_TABLET | Freq: Every day | ORAL | 1 refills | Status: DC
  Filled 2022-01-15: qty 90, fill #0
  Filled 2022-03-01: qty 90, 90d supply, fill #0

## 2022-01-17 ENCOUNTER — Telehealth: Payer: Self-pay | Admitting: Family

## 2022-01-17 NOTE — Telephone Encounter (Signed)
See mychart.  

## 2022-01-18 DIAGNOSIS — K219 Gastro-esophageal reflux disease without esophagitis: Secondary | ICD-10-CM | POA: Insufficient documentation

## 2022-01-18 NOTE — Assessment & Plan Note (Signed)
Obtain follow up A1C.  Continue metformin.

## 2022-01-18 NOTE — Assessment & Plan Note (Signed)
Lab Results  Component Value Date   CHOL 160 09/04/2021   HDL 43.30 09/04/2021   LDLCALC 81 09/04/2021   LDLDIRECT 105.0 02/03/2021   TRIG 178.0 (H) 09/04/2021   CHOLHDL 4 09/04/2021   This SmartLink has not been configured with any valid records.   Last LDL at goal. Contnue atorvastatin '80mg'$ .

## 2022-01-18 NOTE — Assessment & Plan Note (Signed)
Lab Results  Component Value Date   WBC 9.2 09/04/2021   HGB 12.9 09/04/2021   HCT 39.0 09/04/2021   MCV 85.3 09/04/2021   PLT 276.0 09/04/2021   Continue iron once daily.  H/H stable.

## 2022-01-18 NOTE — Assessment & Plan Note (Signed)
BP Readings from Last 3 Encounters:  01/14/22 125/82  09/01/21 133/87  07/28/21 120/84   BP at goal. Continue metoprolol and aldactone.

## 2022-01-18 NOTE — Assessment & Plan Note (Signed)
Stable. Continue prilosec.

## 2022-01-18 NOTE — Assessment & Plan Note (Signed)
Lab Results  Component Value Date   TSH 1.33 01/14/2022   Clinically stable on synthroid. Continue same.

## 2022-01-18 NOTE — Assessment & Plan Note (Signed)
Stable on trazodone. Continue same.  

## 2022-01-26 ENCOUNTER — Other Ambulatory Visit (HOSPITAL_BASED_OUTPATIENT_CLINIC_OR_DEPARTMENT_OTHER): Payer: Self-pay

## 2022-01-26 ENCOUNTER — Other Ambulatory Visit: Payer: Self-pay | Admitting: Infectious Diseases

## 2022-01-26 MED ORDER — CEFADROXIL 500 MG PO CAPS
500.0000 mg | ORAL_CAPSULE | Freq: Two times a day (BID) | ORAL | 0 refills | Status: DC
  Filled 2022-01-26: qty 14, 7d supply, fill #0

## 2022-02-11 DIAGNOSIS — H52221 Regular astigmatism, right eye: Secondary | ICD-10-CM | POA: Diagnosis not present

## 2022-02-11 DIAGNOSIS — E119 Type 2 diabetes mellitus without complications: Secondary | ICD-10-CM | POA: Diagnosis not present

## 2022-02-11 DIAGNOSIS — H35033 Hypertensive retinopathy, bilateral: Secondary | ICD-10-CM | POA: Diagnosis not present

## 2022-02-11 LAB — HM DIABETES EYE EXAM

## 2022-02-15 ENCOUNTER — Encounter: Payer: Self-pay | Admitting: *Deleted

## 2022-02-18 ENCOUNTER — Other Ambulatory Visit (HOSPITAL_BASED_OUTPATIENT_CLINIC_OR_DEPARTMENT_OTHER): Payer: Self-pay

## 2022-02-19 ENCOUNTER — Encounter: Payer: Self-pay | Admitting: Family

## 2022-02-21 MED ORDER — GLIPIZIDE ER 2.5 MG PO TB24
2.5000 mg | ORAL_TABLET | Freq: Every day | ORAL | 1 refills | Status: DC
Start: 2022-02-21 — End: 2022-06-15
  Filled 2022-02-21: qty 24, 24d supply, fill #0
  Filled 2022-02-22: qty 90, 90d supply, fill #0
  Filled 2022-05-19 – 2022-05-24 (×2): qty 90, 90d supply, fill #1

## 2022-02-22 ENCOUNTER — Other Ambulatory Visit (HOSPITAL_BASED_OUTPATIENT_CLINIC_OR_DEPARTMENT_OTHER): Payer: Self-pay

## 2022-02-25 ENCOUNTER — Other Ambulatory Visit: Payer: Self-pay | Admitting: Nurse Practitioner

## 2022-02-25 DIAGNOSIS — D134 Benign neoplasm of liver: Secondary | ICD-10-CM

## 2022-03-02 ENCOUNTER — Other Ambulatory Visit (HOSPITAL_BASED_OUTPATIENT_CLINIC_OR_DEPARTMENT_OTHER): Payer: Self-pay

## 2022-03-18 ENCOUNTER — Ambulatory Visit
Admission: RE | Admit: 2022-03-18 | Discharge: 2022-03-18 | Disposition: A | Discharge status: Home / Self Care | Payer: 59 | Source: Ambulatory Visit | Attending: Nurse Practitioner | Admitting: Nurse Practitioner

## 2022-03-18 DIAGNOSIS — D134 Benign neoplasm of liver: Secondary | ICD-10-CM

## 2022-03-18 MED ORDER — GADOPICLENOL 0.5 MMOL/ML IV SOLN
10.0000 mL | Freq: Once | INTRAVENOUS | Status: AC | PRN
  Administered 2022-03-18: 10 mL via INTRAVENOUS

## 2022-03-23 DIAGNOSIS — D134 Benign neoplasm of liver: Secondary | ICD-10-CM | POA: Diagnosis not present

## 2022-03-23 DIAGNOSIS — K760 Fatty (change of) liver, not elsewhere classified: Secondary | ICD-10-CM | POA: Diagnosis not present

## 2022-04-22 ENCOUNTER — Other Ambulatory Visit (HOSPITAL_BASED_OUTPATIENT_CLINIC_OR_DEPARTMENT_OTHER): Payer: Self-pay

## 2022-05-19 ENCOUNTER — Other Ambulatory Visit (HOSPITAL_BASED_OUTPATIENT_CLINIC_OR_DEPARTMENT_OTHER): Payer: Self-pay

## 2022-05-19 ENCOUNTER — Other Ambulatory Visit: Payer: Self-pay | Admitting: Family

## 2022-05-19 DIAGNOSIS — L680 Hirsutism: Secondary | ICD-10-CM

## 2022-05-19 MED ORDER — SPIRONOLACTONE 50 MG PO TABS
50.0000 mg | ORAL_TABLET | Freq: Every day | ORAL | 0 refills | Status: DC
  Filled 2022-05-19: qty 90, 90d supply, fill #0

## 2022-05-19 NOTE — Telephone Encounter (Signed)
Please contact pt to schedule follow up visit.

## 2022-05-21 NOTE — Telephone Encounter (Signed)
Pt stated she will make the appt on mychart.

## 2022-06-15 ENCOUNTER — Other Ambulatory Visit (HOSPITAL_BASED_OUTPATIENT_CLINIC_OR_DEPARTMENT_OTHER): Payer: Self-pay

## 2022-06-15 ENCOUNTER — Ambulatory Visit (INDEPENDENT_AMBULATORY_CARE_PROVIDER_SITE_OTHER): Payer: Commercial Managed Care - PPO | Admitting: Family

## 2022-06-15 ENCOUNTER — Encounter: Payer: Self-pay | Admitting: Family

## 2022-06-15 VITALS — BP 136/84 | HR 73 | Temp 97.9°F | Resp 16 | Ht 67.0 in | Wt 274.0 lb

## 2022-06-15 DIAGNOSIS — G4700 Insomnia, unspecified: Secondary | ICD-10-CM | POA: Diagnosis not present

## 2022-06-15 DIAGNOSIS — D509 Iron deficiency anemia, unspecified: Secondary | ICD-10-CM

## 2022-06-15 DIAGNOSIS — E785 Hyperlipidemia, unspecified: Secondary | ICD-10-CM | POA: Diagnosis not present

## 2022-06-15 DIAGNOSIS — L680 Hirsutism: Secondary | ICD-10-CM

## 2022-06-15 DIAGNOSIS — E039 Hypothyroidism, unspecified: Secondary | ICD-10-CM | POA: Diagnosis not present

## 2022-06-15 DIAGNOSIS — K219 Gastro-esophageal reflux disease without esophagitis: Secondary | ICD-10-CM | POA: Diagnosis not present

## 2022-06-15 DIAGNOSIS — I10 Essential (primary) hypertension: Secondary | ICD-10-CM | POA: Diagnosis not present

## 2022-06-15 DIAGNOSIS — E119 Type 2 diabetes mellitus without complications: Secondary | ICD-10-CM | POA: Diagnosis not present

## 2022-06-15 LAB — CBC WITH DIFFERENTIAL/PLATELET
Basophils Absolute: 0 10*3/uL (ref 0.0–0.1)
Basophils Relative: 0.5 % (ref 0.0–3.0)
Eosinophils Absolute: 0.2 10*3/uL (ref 0.0–0.7)
Eosinophils Relative: 2.2 % (ref 0.0–5.0)
HCT: 39.9 % (ref 36.0–46.0)
Hemoglobin: 13.5 g/dL (ref 12.0–15.0)
Lymphocytes Relative: 34.2 % (ref 12.0–46.0)
Lymphs Abs: 2.9 10*3/uL (ref 0.7–4.0)
MCHC: 33.8 g/dL (ref 30.0–36.0)
MCV: 86.4 fl (ref 78.0–100.0)
Monocytes Absolute: 0.3 10*3/uL (ref 0.1–1.0)
Monocytes Relative: 3.6 % (ref 3.0–12.0)
Neutro Abs: 5.1 10*3/uL (ref 1.4–7.7)
Neutrophils Relative %: 59.5 % (ref 43.0–77.0)
Platelets: 294 10*3/uL (ref 150.0–400.0)
RBC: 4.61 Mil/uL (ref 3.87–5.11)
RDW: 14.6 % (ref 11.5–15.5)
WBC: 8.6 10*3/uL (ref 4.0–10.5)

## 2022-06-15 LAB — LIPID PANEL
Cholesterol: 196 mg/dL (ref 0–200)
HDL: 51.7 mg/dL (ref >39.00)
LDL Cholesterol: 107 mg/dL — ABNORMAL HIGH (ref 0–99)
NonHDL: 144.57
Total CHOL/HDL Ratio: 4
Triglycerides: 186 mg/dL — ABNORMAL HIGH (ref 0.0–149.0)
VLDL: 37.2 mg/dL (ref 0.0–40.0)

## 2022-06-15 LAB — COMPREHENSIVE METABOLIC PANEL
ALT: 59 U/L — ABNORMAL HIGH (ref 0–35)
AST: 32 U/L (ref 0–37)
Albumin: 4.3 g/dL (ref 3.5–5.2)
Alkaline Phosphatase: 93 U/L (ref 39–117)
BUN: 11 mg/dL (ref 6–23)
CO2: 23 mEq/L (ref 19–32)
Calcium: 9.6 mg/dL (ref 8.4–10.5)
Chloride: 104 mEq/L (ref 96–112)
Creatinine, Ser: 0.45 mg/dL (ref 0.40–1.20)
GFR: 123.7 mL/min (ref >60.00)
Glucose, Bld: 114 mg/dL — ABNORMAL HIGH (ref 70–99)
Potassium: 4.2 mEq/L (ref 3.5–5.1)
Sodium: 139 mEq/L (ref 135–145)
Total Bilirubin: 0.4 mg/dL (ref 0.2–1.2)
Total Protein: 6.7 g/dL (ref 6.0–8.3)

## 2022-06-15 LAB — TSH: TSH: 2.09 u[IU]/mL (ref 0.35–5.50)

## 2022-06-15 LAB — HEMOGLOBIN A1C: Hgb A1c MFr Bld: 6.3 % (ref 4.6–6.5)

## 2022-06-15 MED ORDER — TRAZODONE HCL 50 MG PO TABS
50.0000 mg | ORAL_TABLET | Freq: Every day | ORAL | 1 refills | Status: DC
  Filled 2022-06-15 – 2022-07-22 (×2): qty 90, 90d supply, fill #0
  Filled 2022-10-20: qty 90, 90d supply, fill #1

## 2022-06-15 MED ORDER — HYDROCHLOROTHIAZIDE 25 MG PO TABS
25.0000 mg | ORAL_TABLET | Freq: Every day | ORAL | 1 refills | Status: DC
  Filled 2022-06-15 – 2022-09-14 (×2): qty 90, 90d supply, fill #0
  Filled 2022-12-11: qty 90, 90d supply, fill #1

## 2022-06-15 MED ORDER — METOPROLOL SUCCINATE ER 25 MG PO TB24
25.0000 mg | ORAL_TABLET | Freq: Every day | ORAL | 1 refills | Status: DC
  Filled 2022-06-15 – 2022-07-22 (×2): qty 90, 90d supply, fill #0
  Filled 2022-10-20: qty 90, 90d supply, fill #1

## 2022-06-15 MED ORDER — GLIPIZIDE ER 2.5 MG PO TB24
2.5000 mg | ORAL_TABLET | Freq: Every day | ORAL | 1 refills | Status: DC
  Filled 2022-06-15 – 2022-08-15 (×2): qty 90, 90d supply, fill #0
  Filled 2022-11-17 – 2022-11-29 (×2): qty 90, 90d supply, fill #1

## 2022-06-15 MED ORDER — METFORMIN HCL 1000 MG PO TABS
1000.0000 mg | ORAL_TABLET | Freq: Two times a day (BID) | ORAL | 1 refills | Status: DC
  Filled 2022-06-15: qty 180, 90d supply, fill #0
  Filled 2022-09-14: qty 180, 90d supply, fill #1

## 2022-06-15 MED ORDER — ATORVASTATIN CALCIUM 80 MG PO TABS
80.0000 mg | ORAL_TABLET | Freq: Every day | ORAL | 1 refills | Status: DC
  Filled 2022-06-15 – 2022-07-22 (×2): qty 90, 90d supply, fill #0
  Filled 2022-10-20: qty 90, 90d supply, fill #1

## 2022-06-15 MED ORDER — LEVOTHYROXINE SODIUM 50 MCG PO TABS
50.0000 ug | ORAL_TABLET | Freq: Every day | ORAL | 1 refills | Status: DC
  Filled 2022-06-15: qty 90, 90d supply, fill #0
  Filled 2022-09-14: qty 90, 90d supply, fill #1

## 2022-06-15 MED ORDER — SPIRONOLACTONE 50 MG PO TABS
50.0000 mg | ORAL_TABLET | Freq: Every day | ORAL | 1 refills | Status: DC
  Filled 2022-06-15 – 2022-09-14 (×2): qty 90, 90d supply, fill #0
  Filled 2022-12-11: qty 90, 90d supply, fill #1

## 2022-06-15 MED ORDER — OMEPRAZOLE 20 MG PO CPDR
20.0000 mg | DELAYED_RELEASE_CAPSULE | Freq: Every day | ORAL | 1 refills | Status: DC
  Filled 2022-06-15 – 2022-07-22 (×2): qty 90, 90d supply, fill #0
  Filled 2022-10-20: qty 90, 90d supply, fill #1

## 2022-06-15 NOTE — Assessment & Plan Note (Signed)
Stable with HS trazodone.

## 2022-06-15 NOTE — Assessment & Plan Note (Signed)
Stable on omeprazole. 

## 2022-06-15 NOTE — Assessment & Plan Note (Signed)
Lab Results  Component Value Date   TSH 1.33 01/14/2022   Clinically stable. Continue synthroid.

## 2022-06-15 NOTE — Assessment & Plan Note (Signed)
Tolerating po iron. Obtain follow up cbc/iron studies.

## 2022-06-15 NOTE — Assessment & Plan Note (Signed)
Tolerating statin, update lipid panel.

## 2022-06-15 NOTE — Assessment & Plan Note (Signed)
She has had some mild hypoglycemia in the late mornings. Recommended that she try to eat a heartier breakfast or take it with her larger meal at lunch time.

## 2022-06-15 NOTE — Assessment & Plan Note (Signed)
BP stable. Continue toprol xl, hctz, and aldactone.

## 2022-06-15 NOTE — Progress Notes (Signed)
Subjective:     Patient ID: Kristy Chung, female    DOB: 03/03/1986, 37 y.o.   MRN: [id number]  Chief Complaint  Patient presents with   Diabetes    Here for follow up   Hypertension   Hypothyroidism    Here for follow up    Diabetes  Hypertension   Patient is in today for follow up.  Declines covid booster.  DM2- maintained on glucotrol and metformin.  She has had low 70's.    Lab Results  Component Value Date   HGBA1C 7.2 (H) 01/14/2022   HGBA1C 7.2 (H) 09/04/2021   HGBA1C 6.7 (H) 02/03/2021   Lab Results  Component Value Date   MICROALBUR 1.0 09/04/2021   LDLCALC 81 09/04/2021   CREATININE 0.60 01/14/2022   HTN- on aldactone, hctz and toprol xl.  BP Readings from Last 3 Encounters:  06/15/22 136/84  01/14/22 125/82  09/01/21 133/87   GERD- maintained on omeprazole.   Iron def anemia- maintained on iron supplement once daily.  Lab Results  Component Value Date   WBC 9.2 09/04/2021   HGB 12.9 09/04/2021   HCT 39.0 09/04/2021   MCV 85.3 09/04/2021   PLT 276.0 09/04/2021   Hypothyroid- maintained on synthroid 73mg.  Lab Results  Component Value Date   TSH 1.33 01/14/2022   Hyperlipidemia- maintained on lipitor '80mg'$ .  Lab Results  Component Value Date   CHOL 160 09/04/2021   HDL 43.30 09/04/2021   LDLCALC 81 09/04/2021   LDLDIRECT 105.0 02/03/2021   TRIG 178.0 (H) 09/04/2021   CHOLHDL 4 09/04/2021   Morbid obesity-  Wt Readings from Last 3 Encounters:  06/15/22 274 lb (124.3 kg)  01/14/22 271 lb (122.9 kg)  09/01/21 274 lb (124.3 kg)       There are no preventive care reminders to display for this patient.   Past Medical History:  Diagnosis Date   Allergy    Diabetes mellitus without complication (HCC)    Hepatic adenoma    benign; related to birth control   History of chicken pox    Hyperlipidemia    Hypertension    Hypothyroid    Iron deficiency anemia 06/20/2019    Past Surgical History:  Procedure Laterality Date    WISDOM TOOTH EXTRACTION  2005    Family History  Problem Relation Age of Onset   Hyperlipidemia Mother    Depression Mother    Hyperlipidemia Father    Hypertension Father    Hypothyroidism Maternal Grandmother    Heart disease Maternal Grandfather        MI, stent, CAD   Hypertension Maternal Grandfather    Hyperlipidemia Maternal Grandfather    COPD Paternal Grandmother    Hypertension Paternal Grandmother    Hyperlipidemia Paternal Grandmother    Heart disease Paternal Grandmother    AAA (abdominal aortic aneurysm) Paternal Grandfather    Dementia Paternal Grandfather    Heart disease Paternal Grandfather     Social History   Socioeconomic History   Marital status: Single    Spouse name: Not on file   Number of children: Not on file   Years of education: Not on file   Highest education level: Not on file  Occupational History   Not on file  Tobacco Use   Smoking status: Never   Smokeless tobacco: Never  Vaping Use   Vaping Use: Never used  Substance and Sexual Activity   Alcohol use: Yes    Alcohol/week: 0.0 standard drinks of  alcohol    Comment: occasional   Drug use: No   Sexual activity: Yes    Partners: Male    Birth control/protection: None    Comment: INTERCOURSE AGE 26, SEXUAL PARTNERS 5  Other Topics Concern   Not on file  Social History Narrative   Enjoys family friends, sleeping, reading, relaxing   2 cats "Bruce and Bella"   No children   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 [place]   Social Determinants of Radio broadcast assistant Strain: Not on file  Food Insecurity: Not on file  Transportation Needs: Not on file  Physical Activity: Not on file  Stress: Not on file  Social Connections: Not on file  Intimate Partner Violence: Not on file    Outpatient Medications Prior to Visit  Medication Sig Dispense Refill   Ferrous Sulfate (IRON) 325 (65 Fe) MG TABS Take 1 tablet (325 mg total) by mouth daily. 1 tablet by mouth twice  daily 30 tablet 0   glucose blood (FREESTYLE TEST STRIPS) test strip Use as instructed (Patient taking differently: Use as instructed to check blood sugar 1 to 2 times daily.) 100 each 12   glucose monitoring kit (FREESTYLE) monitoring kit 1 each by Does not apply route as needed for other. 1 each 0   Lancets (FREESTYLE) lancets Use as instructed 100 each 12   Multiple Vitamins-Iron (MULTI-VITAMIN/IRON) TABS Take 1 tablet by mouth daily.  0   Omega-3 Fatty Acids (FISH OIL) 1000 MG CAPS Take 3 capsules (3,000 mg total) by mouth in the morning and at bedtime.  0   atorvastatin (LIPITOR) 80 MG tablet TAKE 1 TABLET (80 MG TOTAL) BY MOUTH DAILY. 90 tablet 1   glipiZIDE (GLUCOTROL XL) 2.5 MG 24 hr tablet Take 1 tablet (2.5 mg total) by mouth daily with breakfast. 90 tablet 1   hydrochlorothiazide (HYDRODIURIL) 25 MG tablet Take 1 tablet (25 mg total) by mouth daily. 90 tablet 1   levothyroxine (SYNTHROID) 50 MCG tablet TAKE 1 TABLET BY MOUTH ONCE DAILY BEFORE BREAKFAST 90 tablet 1   metFORMIN (GLUCOPHAGE) 1000 MG tablet TAKE 1 TABLET (1,000 MG TOTAL) BY MOUTH 2 (TWO) TIMES DAILY WITH A MEAL. 180 tablet 1   metoprolol succinate (TOPROL-XL) 25 MG 24 hr tablet TAKE 1 TABLET (25 MG TOTAL) BY MOUTH DAILY. 90 tablet 1   omeprazole (PRILOSEC) 20 MG capsule TAKE 1 CAPSULE (20 MG TOTAL) BY MOUTH DAILY. 90 capsule 1   spironolactone (ALDACTONE) 50 MG tablet Take 1 tablet (50 mg total) by mouth daily. 90 tablet 0   traZODone (DESYREL) 50 MG tablet Take 1 tablet (50 mg total) by mouth at bedtime. 90 tablet 1   No facility-administered medications prior to visit.    Allergies  Allergen Reactions   Amlodipine     flushing   Codeine     Allscripts Description: Codeine DerivativesAllscripts Description: Codeine Sulfate TABSAllscripts Description: Codeine Phosphate Powder Allscripts Description: Codeine Phosphate Powder   Other     CINNAMON GUM--blisters.    ROS See HPI    Objective:    Physical  Exam Constitutional:      General: She is not in acute distress.    Appearance: Normal appearance. She is well-developed.  HENT:     Head: Normocephalic and atraumatic.     Right Ear: External ear normal.     Left Ear: External ear normal.  Eyes:     General: No scleral icterus. Neck:  Thyroid: No thyromegaly.  Cardiovascular:     Rate and Rhythm: Normal rate and regular rhythm.     Heart sounds: Normal heart sounds. No murmur heard. Pulmonary:     Effort: Pulmonary effort is normal. No respiratory distress.     Breath sounds: Normal breath sounds. No wheezing.  Musculoskeletal:     Cervical back: Neck supple.  Skin:    General: Skin is warm and dry.  Neurological:     Mental Status: She is alert and oriented to person, place, and time.  Psychiatric:        Mood and Affect: Mood normal.        Behavior: Behavior normal.        Thought Content: Thought content normal.        Judgment: Judgment normal.     BP 136/84 (BP Location: Right Arm, Patient Position: Sitting, Cuff Size: Large)   Pulse 73   Temp 97.9 F (36.6 C) (Oral)   Resp 16   Ht '5\' 7"'$  (1.702 m)   Wt 274 lb (124.3 kg)   SpO2 98%   BMI 42.91 kg/m  Wt Readings from Last 3 Encounters:  06/15/22 274 lb (124.3 kg)  01/14/22 271 lb (122.9 kg)  09/01/21 274 lb (124.3 kg)       Assessment & Plan:   Problem List Items Addressed This Visit       Unprioritized   Morbid obesity ([place])    We did discuss trial of GLP-1 agonist but she wishes to continue her work on diet/exercise.       Relevant Medications   metFORMIN (GLUCOPHAGE) 1000 MG tablet   glipiZIDE (GLUCOTROL XL) 2.5 MG 24 hr tablet   Iron deficiency anemia    Tolerating po iron. Obtain follow up cbc/iron studies.       Relevant Orders   CBC w/Diff   Iron, TIBC and Ferritin Panel   Insomnia    Stable with HS trazodone.       Hypothyroidism    Lab Results  Component Value Date   TSH 1.33 01/14/2022  Clinically stable. Continue  synthroid.       Relevant Medications   metoprolol succinate (TOPROL-XL) 25 MG 24 hr tablet   levothyroxine (SYNTHROID) 50 MCG tablet   Other Relevant Orders   TSH   Hyperlipidemia    Tolerating statin, update lipid panel.       Relevant Medications   spironolactone (ALDACTONE) 50 MG tablet   metoprolol succinate (TOPROL-XL) 25 MG 24 hr tablet   hydrochlorothiazide (HYDRODIURIL) 25 MG tablet   atorvastatin (LIPITOR) 80 MG tablet   Other Relevant Orders   Lipid panel   HTN (hypertension) - Primary    BP stable. Continue toprol xl, hctz, and aldactone.      Relevant Medications   spironolactone (ALDACTONE) 50 MG tablet   metoprolol succinate (TOPROL-XL) 25 MG 24 hr tablet   hydrochlorothiazide (HYDRODIURIL) 25 MG tablet   atorvastatin (LIPITOR) 80 MG tablet   Other Relevant Orders   Comp Met (CMET)   GERD (gastroesophageal reflux disease)    Stable on omeprazole.       Relevant Medications   omeprazole (PRILOSEC) 20 MG capsule   Diabetes type 2, controlled ([place])    She has had some mild hypoglycemia in the late mornings. Recommended that she try to eat a heartier breakfast or take it with her larger meal at lunch time.       Relevant Medications   metFORMIN (GLUCOPHAGE) 1000 MG  tablet   glipiZIDE (GLUCOTROL XL) 2.5 MG 24 hr tablet   atorvastatin (LIPITOR) 80 MG tablet   Other Relevant Orders   HgB A1c   Comp Met (CMET)   Other Visit Diagnoses     Hirsutism       Relevant Medications   spironolactone (ALDACTONE) 50 MG tablet       I have changed Ebony Hail B. Schone's levothyroxine. I am also having her maintain her glucose monitoring kit, freestyle, glucose blood, Multi-Vitamin/Iron, Fish Oil, Iron, metFORMIN, spironolactone, traZODone, omeprazole, metoprolol succinate, hydrochlorothiazide, glipiZIDE, and atorvastatin.  Meds ordered this encounter  Medications   metFORMIN (GLUCOPHAGE) 1000 MG tablet    Sig: Take 1 tablet (1,000 mg total) by mouth 2 (two)  times daily with a meal.    Dispense:  180 tablet    Refill:  1    Requested drug refills are authorized, however, the patient needs further evaluation and/or laboratory testing before further refills are given. Ask her to make an appointment for this.    Order Specific Question:   Supervising Provider    Answer:   Mosie Lukes [[id number]]   spironolactone (ALDACTONE) 50 MG tablet    Sig: Take 1 tablet (50 mg total) by mouth daily.    Dispense:  90 tablet    Refill:  1    Order Specific Question:   Supervising Provider    Answer:   Penni Homans A [[id number]]   traZODone (DESYREL) 50 MG tablet    Sig: Take 1 tablet (50 mg total) by mouth at bedtime.    Dispense:  90 tablet    Refill:  1    Order Specific Question:   Supervising Provider    Answer:   Penni Homans A [[id number]]   omeprazole (PRILOSEC) 20 MG capsule    Sig: Take 1 capsule (20 mg total) by mouth daily.    Dispense:  90 capsule    Refill:  1    Order Specific Question:   Supervising Provider    Answer:   Penni Homans A [[id number]]   metoprolol succinate (TOPROL-XL) 25 MG 24 hr tablet    Sig: Take 1 tablet (25 mg total) by mouth daily.    Dispense:  90 tablet    Refill:  1    Order Specific Question:   Supervising Provider    Answer:   Penni Homans A [[id number]]   levothyroxine (SYNTHROID) 50 MCG tablet    Sig: Take 1 tablet (50 mcg total) by mouth daily before breakfast.    Dispense:  90 tablet    Refill:  1    Order Specific Question:   Supervising Provider    Answer:   Penni Homans A [[id number]]   hydrochlorothiazide (HYDRODIURIL) 25 MG tablet    Sig: Take 1 tablet (25 mg total) by mouth daily.    Dispense:  90 tablet    Refill:  1    Order Specific Question:   Supervising Provider    Answer:   Penni Homans A [[id number]]   glipiZIDE (GLUCOTROL XL) 2.5 MG 24 hr tablet    Sig: Take 1 tablet (2.5 mg total) by mouth daily with breakfast.    Dispense:  90 tablet    Refill:  1    Order Specific Question:   Supervising Provider    Answer:    Penni Homans A [[id number]]   atorvastatin (LIPITOR) 80 MG tablet    Sig: Take 1 tablet (80 mg total) by mouth daily.  Dispense:  90 tablet    Refill:  1    Order Specific Question:   Supervising Provider    Answer:   Penni Homans A [[id number]]

## 2022-06-15 NOTE — Assessment & Plan Note (Addendum)
We did discuss trial of GLP-1 agonist but she wishes to continue her work on diet/exercise.

## 2022-06-16 LAB — IRON,TIBC AND FERRITIN PANEL
%SAT: 14 % (calc) — ABNORMAL LOW (ref 16–45)
Ferritin: 23 ng/mL (ref 16–154)
Iron: 54 ug/dL (ref 40–190)
TIBC: 398 mcg/dL (calc) (ref 250–450)

## 2022-07-22 ENCOUNTER — Other Ambulatory Visit (HOSPITAL_BASED_OUTPATIENT_CLINIC_OR_DEPARTMENT_OTHER): Payer: Self-pay

## 2022-08-03 ENCOUNTER — Ambulatory Visit: Payer: Commercial Managed Care - PPO | Admitting: Nurse Practitioner

## 2022-08-10 ENCOUNTER — Ambulatory Visit: Payer: Commercial Managed Care - PPO | Admitting: Nurse Practitioner

## 2022-08-12 ENCOUNTER — Ambulatory Visit: Payer: Commercial Managed Care - PPO | Admitting: Nurse Practitioner

## 2022-08-16 ENCOUNTER — Other Ambulatory Visit (HOSPITAL_BASED_OUTPATIENT_CLINIC_OR_DEPARTMENT_OTHER): Payer: Self-pay

## 2022-08-25 ENCOUNTER — Encounter: Payer: Self-pay | Admitting: Family

## 2022-09-06 ENCOUNTER — Ambulatory Visit: Payer: Commercial Managed Care - PPO | Admitting: Nurse Practitioner

## 2022-09-14 ENCOUNTER — Other Ambulatory Visit (HOSPITAL_BASED_OUTPATIENT_CLINIC_OR_DEPARTMENT_OTHER): Payer: Self-pay

## 2022-09-30 ENCOUNTER — Ambulatory Visit: Payer: Commercial Managed Care - PPO | Admitting: Nurse Practitioner

## 2022-09-30 VITALS — BP 122/80

## 2022-09-30 DIAGNOSIS — N907 Vulvar cyst: Secondary | ICD-10-CM

## 2022-09-30 NOTE — Progress Notes (Signed)
   Acute Office Visit  Subjective:    Patient ID: Kristy Chung, female    DOB: [birth year], 37 y.o.   MRN: [id number]   HPI 37 y.o. presents today for right labial cyst since 09/14/2022. It has fluctuated in size, sometimes tender, sometimes has serous drainage.   Patient's last menstrual period was 09/09/2022 (exact date).    Review of Systems  Constitutional: Negative.   Genitourinary:  Positive for genital sores (Labial cyst).       Objective:    Physical Exam Constitutional:      Appearance: Normal appearance.  Genitourinary:      BP 122/80 (BP Location: Right Arm, Patient Position: Sitting, Cuff Size: Large)   LMP 09/09/2022 (Exact Date)  Wt Readings from Last 3 Encounters:  06/15/22 274 lb (124.3 kg)  01/14/22 271 lb (122.9 kg)  09/01/21 274 lb (124.3 kg)        Patient informed chaperone available to be present for breast and/or pelvic exam. Patient has requested no chaperone to be present. Patient has been advised what will be completed during breast and pelvic exam.   Assessment & Plan:   Problem List Items Addressed This Visit   None Visit Diagnoses     Labial cyst    -  Primary      Plan: Reassurance provided on non-infectious appearing fluid-filled cyst. Needle aspiration with mix of serous and blood. Provided with triple antibiotic ointment to apply 2-3 times daily for a few days and instructed to use warm compresses. Returns for annual in a couple of weeks. Will re-evaluate then or sooner if area worsens/shows signs of infection.      Kristy Mackie DNP, 3:55 PM 09/30/2022

## 2022-10-21 ENCOUNTER — Encounter: Payer: Self-pay | Admitting: Nurse Practitioner

## 2022-10-21 ENCOUNTER — Other Ambulatory Visit (HOSPITAL_COMMUNITY)
Admission: RE | Admit: 2022-10-21 | Discharge: 2022-10-21 | Disposition: A | Discharge status: Home / Self Care | Payer: Commercial Managed Care - PPO | Source: Ambulatory Visit | Attending: Nurse Practitioner | Admitting: Nurse Practitioner

## 2022-10-21 ENCOUNTER — Ambulatory Visit (INDEPENDENT_AMBULATORY_CARE_PROVIDER_SITE_OTHER): Payer: Commercial Managed Care - PPO | Admitting: Nurse Practitioner

## 2022-10-21 VITALS — BP 122/72 | HR 80 | Ht 67.0 in | Wt 275.0 lb

## 2022-10-21 DIAGNOSIS — Z01419 Encounter for gynecological examination (general) (routine) without abnormal findings: Secondary | ICD-10-CM | POA: Diagnosis not present

## 2022-10-21 DIAGNOSIS — Z124 Encounter for screening for malignant neoplasm of cervix: Secondary | ICD-10-CM | POA: Diagnosis not present

## 2022-10-21 DIAGNOSIS — N907 Vulvar cyst: Secondary | ICD-10-CM

## 2022-10-21 NOTE — Progress Notes (Signed)
   Kristy Chung January 27, 1986 [id number]   History:  37 y.o. G0 presents for annual exam and to follow up on labial cyst. Seen 09/30/22 for non-infectious fluid-filled cyst. Needle aspiration performed, antibiotic ointment provided. History of irregular cycles but cycles regulated after she stopped working nights and lost some weight. Normal pap history. DM, HTN, and hypothyroidism managed by PCP. History of liver adenoma that decreased in size after stopping OCPs. No contraception, OK with pregnancy.   Gynecologic History Patient's last menstrual period was 10/08/2022 (exact date). Period Cycle (Days): 28 Period Duration (Days): 7 Period Pattern: Regular Menstrual Flow: Moderate Menstrual Control: Tampon Menstrual Control Change Freq (Hours): 3 Dysmenorrhea: (!) Moderate Dysmenorrhea Symptoms: Cramping, Headache Contraception/Family planning: none Sexually active: Yes  Health Maintenance Last Pap: 06/13/2018. Results were: Nelva Bush neg HPV, 5-year repeat Last mammogram: Not indicated Last colonoscopy: Not indicated Last Dexa: Not indicated  Past medical history, past surgical history, family history and social history were all reviewed and documented in the EPIC chart. 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  ROS:  A ROS was performed and pertinent positives and negatives are included.  Exam:  Vitals:   10/21/22 1023  BP: 122/72  Pulse: 80  SpO2: 98%  Weight: 275 lb (124.7 kg)  Height: 5\' 7"  (1.702 m)     Body mass index is 43.07 kg/m.  General appearance:  Normal Thyroid:  Symmetrical, normal in size, without palpable masses or nodularity. Respiratory  Auscultation:  Clear without wheezing or rhonchi Cardiovascular  Auscultation:  Regular rate, without rubs, murmurs or gallops  Edema/varicosities:  Not grossly evident Abdominal  Soft,nontender, without masses, guarding or rebound.  Liver/spleen:  No organomegaly noted  Hernia:  None appreciated  Skin  Inspection:   Grossly normal Breasts: Examined lying and sitting.   Right: Without masses, retractions, discharge or axillary adenopathy.   Left: Without masses, retractions, discharge or axillary adenopathy. Genitourinary   Inguinal/mons:  Normal without inguinal adenopathy  External genitalia:  Normal appearing vulva with no masses, tenderness, or lesions  BUS/Urethra/Skene's glands:  Normal  Vagina:  Normal appearing with normal color and discharge, no lesions  Cervix:  Normal appearing without discharge or lesions  Uterus:  Difficult to palpate due to body habitus  but no gross masses or tenderness  Adnexa/parametria:     Rt: Normal in size, without masses or tenderness.   Lt: Normal in size, without masses or tenderness.  Anus and perineum: Normal  Digital rectal exam: Not indicated  Patient informed chaperone available to be present for breast and pelvic exam. Patient has requested no chaperone to be present. Patient has been advised what will be completed during breast and pelvic exam.   Assessment/Plan:  37 y.o. G0 for annual exam.   Well female exam with routine gynecological exam - Education provided on SBEs, importance of preventative screenings, current guidelines, high calcium diet, regular exercise, and multivitamin daily. Labs with PCP.   Screening for cervical cancer - Plan: Cytology - PAP( [facility]). Normal pap history.   Labial cyst - small today, non-infectious. Will monitor. Continue warm compresses as needed. Will return if area becomes large, red or painful.   Follow up in 1 year for annual.      [name] [facility], 10:39 AM 10/21/2022

## 2022-10-25 ENCOUNTER — Other Ambulatory Visit: Payer: Self-pay | Admitting: Nurse Practitioner

## 2022-10-25 ENCOUNTER — Other Ambulatory Visit (HOSPITAL_BASED_OUTPATIENT_CLINIC_OR_DEPARTMENT_OTHER): Payer: Self-pay

## 2022-10-25 DIAGNOSIS — B9689 Other specified bacterial agents as the cause of diseases classified elsewhere: Secondary | ICD-10-CM

## 2022-10-25 LAB — CYTOLOGY - PAP
Comment: NEGATIVE
Diagnosis: NEGATIVE
High risk HPV: NEGATIVE

## 2022-10-25 MED ORDER — METRONIDAZOLE 500 MG PO TABS
500.0000 mg | ORAL_TABLET | Freq: Two times a day (BID) | ORAL | 0 refills | Status: DC
  Filled 2022-10-25: qty 14, 7d supply, fill #0

## 2022-11-26 ENCOUNTER — Other Ambulatory Visit (HOSPITAL_BASED_OUTPATIENT_CLINIC_OR_DEPARTMENT_OTHER): Payer: Self-pay

## 2022-11-29 ENCOUNTER — Other Ambulatory Visit (HOSPITAL_BASED_OUTPATIENT_CLINIC_OR_DEPARTMENT_OTHER): Payer: Self-pay

## 2022-12-11 ENCOUNTER — Other Ambulatory Visit: Payer: Self-pay | Admitting: Family

## 2022-12-12 ENCOUNTER — Other Ambulatory Visit (HOSPITAL_BASED_OUTPATIENT_CLINIC_OR_DEPARTMENT_OTHER): Payer: Self-pay

## 2022-12-12 MED ORDER — METFORMIN HCL 1000 MG PO TABS
1000.0000 mg | ORAL_TABLET | Freq: Two times a day (BID) | ORAL | 1 refills | Status: DC
  Filled 2022-12-12: qty 180, 90d supply, fill #0
  Filled 2023-03-20: qty 180, 90d supply, fill #1

## 2022-12-12 MED ORDER — LEVOTHYROXINE SODIUM 50 MCG PO TABS
50.0000 ug | ORAL_TABLET | Freq: Every day | ORAL | 1 refills | Status: DC
  Filled 2022-12-12: qty 90, 90d supply, fill #0
  Filled 2023-03-20: qty 90, 90d supply, fill #1

## 2022-12-13 ENCOUNTER — Other Ambulatory Visit (HOSPITAL_BASED_OUTPATIENT_CLINIC_OR_DEPARTMENT_OTHER): Payer: Self-pay

## 2023-01-16 ENCOUNTER — Other Ambulatory Visit: Payer: Self-pay | Admitting: Family

## 2023-01-17 ENCOUNTER — Other Ambulatory Visit (HOSPITAL_BASED_OUTPATIENT_CLINIC_OR_DEPARTMENT_OTHER): Payer: Self-pay

## 2023-01-17 MED ORDER — TRAZODONE HCL 50 MG PO TABS
50.0000 mg | ORAL_TABLET | Freq: Every day | ORAL | 1 refills | Status: DC
  Filled 2023-01-17: qty 90, 90d supply, fill #0
  Filled 2023-04-17: qty 90, 90d supply, fill #1

## 2023-01-17 MED ORDER — OMEPRAZOLE 20 MG PO CPDR
20.0000 mg | DELAYED_RELEASE_CAPSULE | Freq: Every day | ORAL | 1 refills | Status: DC
  Filled 2023-01-17: qty 90, 90d supply, fill #0
  Filled 2023-04-17: qty 90, 90d supply, fill #1

## 2023-01-17 MED ORDER — ATORVASTATIN CALCIUM 80 MG PO TABS
80.0000 mg | ORAL_TABLET | Freq: Every day | ORAL | 1 refills | Status: DC
  Filled 2023-01-17: qty 90, 90d supply, fill #0
  Filled 2023-06-05: qty 90, 90d supply, fill #1

## 2023-01-17 MED ORDER — METOPROLOL SUCCINATE ER 25 MG PO TB24
25.0000 mg | ORAL_TABLET | Freq: Every day | ORAL | 1 refills | Status: DC
  Filled 2023-01-17: qty 90, 90d supply, fill #0
  Filled 2023-06-05: qty 90, 90d supply, fill #1

## 2023-01-24 ENCOUNTER — Ambulatory Visit: Payer: Commercial Managed Care - PPO | Admitting: Family

## 2023-01-24 VITALS — BP 120/79 | HR 74 | Temp 98.0°F | Resp 16 | Wt 281.0 lb

## 2023-01-24 DIAGNOSIS — D134 Benign neoplasm of liver: Secondary | ICD-10-CM | POA: Diagnosis not present

## 2023-01-24 DIAGNOSIS — D509 Iron deficiency anemia, unspecified: Secondary | ICD-10-CM | POA: Diagnosis not present

## 2023-01-24 DIAGNOSIS — E119 Type 2 diabetes mellitus without complications: Secondary | ICD-10-CM | POA: Diagnosis not present

## 2023-01-24 DIAGNOSIS — I10 Essential (primary) hypertension: Secondary | ICD-10-CM | POA: Diagnosis not present

## 2023-01-24 LAB — BASIC METABOLIC PANEL
BUN: 15 mg/dL (ref 6–23)
CO2: 24 meq/L (ref 19–32)
Calcium: 9 mg/dL (ref 8.4–10.5)
Chloride: 103 meq/L (ref 96–112)
Creatinine, Ser: 0.62 mg/dL (ref 0.40–1.20)
GFR: 114.02 mL/min (ref >60.00)
Glucose, Bld: 152 mg/dL — ABNORMAL HIGH (ref 70–99)
Potassium: 3.9 meq/L (ref 3.5–5.1)
Sodium: 138 meq/L (ref 135–145)

## 2023-01-24 LAB — HEMOGLOBIN A1C: Hgb A1c MFr Bld: 7 % — ABNORMAL HIGH (ref 4.6–6.5)

## 2023-01-24 NOTE — Assessment & Plan Note (Signed)
  On iron supplementation. -Continue iron supplementation. -Defer complete blood count due to patient's financial concerns.

## 2023-01-24 NOTE — Assessment & Plan Note (Signed)
  Well controlled on Metoprolol and Aldactone. -Continue current regimen.

## 2023-01-24 NOTE — Assessment & Plan Note (Signed)
Clinically stable.  Continues annual follow up with Hepatology, Annamarie Major NP.

## 2023-01-24 NOTE — Patient Instructions (Signed)
VISIT SUMMARY:  During your recent visit, we discussed your diabetes, hyperlipidemia, hypertension, and iron deficiency anemia. You mentioned experiencing occasional low blood sugar levels, which you manage by eating snacks. Your cholesterol level is slightly above the target, but your blood pressure is well controlled. You are taking an iron supplement for your anemia.  YOUR PLAN:  -DIABETES: Diabetes is a condition where your body doesn't properly process food for use as energy, leading to high blood sugar levels. We will continue your current treatment with Glipizide and Metformin. If your low blood sugar episodes become more frequent, we may consider stopping Glipizide. We will also check your A1c and basic metabolic panel, which are tests to monitor your diabetes.  -HYPERLIPIDEMIA: Hyperlipidemia means you have high levels of fats (lipids) in your blood, which can increase your risk of heart disease. We will continue your current treatment with Atorvastatin. Due to your financial concerns, we will defer the lipid panel, which is a blood test to check your cholesterol levels.  -HYPERTENSION: Hypertension, or high blood pressure, can lead to serious health problems like heart disease and stroke if not controlled. Your blood pressure is well controlled with Metoprolol and Aldactone, so we will continue this treatment.  -IRON DEFICIENCY ANEMIA: Iron deficiency anemia is a condition where your body lacks enough iron to produce hemoglobin, a substance in your red blood cells that allows them to carry oxygen. You will continue taking your iron supplement. We will defer the complete blood count, a test to check your red and white blood cells, due to your financial concerns.  INSTRUCTIONS:  For your general health maintenance, please get a COVID-19 booster shot when it becomes available and a flu shot at work. We will follow up after the new year.

## 2023-01-24 NOTE — Assessment & Plan Note (Signed)
Lab Results  Component Value Date   HGBA1C 6.3 06/15/2022   HGBA1C 7.2 (H) 01/14/2022   HGBA1C 7.2 (H) 09/04/2021   Lab Results  Component Value Date   MICROALBUR 1.0 09/04/2021   LDLCALC 107 (H) 06/15/2022   CREATININE 0.45 06/15/2022    Intermittent hypoglycemia, likely related to missed meals. Currently managed with Glipizide and Metformin. -Continue current regimen. -Consider discontinuing Glipizide if hypoglycemia becomes more frequent. -Check A1c and basic metabolic panel.

## 2023-01-24 NOTE — Progress Notes (Signed)
Subjective:     Patient ID: Kristy Chung, female    DOB: 02/18/1986, 37 y.o.   MRN: [id number]  Chief Complaint  Patient presents with   Diabetes    Here for follow up   Hypothyroidism    Here for follow up   Hypertension    Here for follow up    Diabetes  Hypertension    Discussed the use of AI scribe software for clinical note transcription with the patient, who gave verbal consent to proceed.  History of Present Illness   The patient, with a history of diabetes, hyperlipidemia, and hypertension, presents for a routine follow-up. They report intermittent hypoglycemia, with blood sugars in the 70s, which they attribute to skipping meals. They manage these episodes with snacks and do not feel terrible, but note that their legs feel a little jittery. They are currently on glipizide and metformin for diabetes management.  For their hyperlipidemia, they are on atorvastatin. They express concern about the cost of routine labs and would like to minimize these where possible. Their last LDL was 107, close to the target of under 100.  Their hypertension is well controlled, with a blood pressure of 120/79 at this visit. They are on metoprolol and Aldactone, the latter of which also helps with their acne.  They are also on an iron supplement, which they take daily. They report no swelling in the ankles.      Lab Results  Component Value Date   HGBA1C 6.3 06/15/2022   HGBA1C 7.2 (H) 01/14/2022   HGBA1C 7.2 (H) 09/04/2021   Lab Results  Component Value Date   MICROALBUR 1.0 09/04/2021   LDLCALC 107 (H) 06/15/2022   CREATININE 0.45 06/15/2022   Wt Readings from Last 3 Encounters:  01/24/23 281 lb (127.5 kg)  10/21/22 275 lb (124.7 kg)  06/15/22 274 lb (124.3 kg)   BP Readings from Last 3 Encounters:  01/24/23 120/79  10/21/22 122/72  09/30/22 122/80   Lab Results  Component Value Date   CHOL 196 06/15/2022   HDL 51.70 06/15/2022   LDLCALC 107 (H) 06/15/2022    LDLDIRECT 105.0 02/03/2021   TRIG 186.0 (H) 06/15/2022   CHOLHDL 4 06/15/2022       Health Maintenance Due  Topic Date Due   HEMOGLOBIN A1C  12/14/2022   INFLUENZA VACCINE  12/16/2022   COVID-19 Vaccine (3 - 2023-24 season) 01/16/2023    Past Medical History:  Diagnosis Date   Allergy    Diabetes mellitus without complication (HCC)    Hepatic adenoma    benign; related to birth control   History of chicken pox    Hyperlipidemia    Hypertension    Hypothyroid    Iron deficiency anemia 06/20/2019    Past Surgical History:  Procedure Laterality Date   WISDOM TOOTH EXTRACTION  2005    Family History  Problem Relation Age of Onset   Hyperlipidemia Mother    Depression Mother    Hyperlipidemia Father    Hypertension Father    Hypothyroidism Maternal Grandmother    Heart disease Maternal Grandfather        MI, stent, CAD   Hypertension Maternal Grandfather    Hyperlipidemia Maternal Grandfather    COPD Paternal Grandmother    Hypertension Paternal Grandmother    Hyperlipidemia Paternal Grandmother    Heart disease Paternal Grandmother    AAA (abdominal aortic aneurysm) Paternal Grandfather    Dementia Paternal Grandfather    Heart disease Paternal Grandfather  Social History   Socioeconomic History   Marital status: Single    Spouse name: Not on file   Number of children: Not on file   Years of education: Not on file   Highest education level: Bachelor's degree (e.g., BA, AB, BS)  Occupational History   Not on file  Tobacco Use   Smoking status: Never   Smokeless tobacco: Never  Vaping Use   Vaping status: Never Used  Substance and Sexual Activity   Alcohol use: Yes    Alcohol/week: 0.0 standard drinks of alcohol    Comment: occasional   Drug use: No   Sexual activity: Yes    Partners: Male    Birth control/protection: None    Comment: INTERCOURSE AGE 30, SEXUAL PARTNERS 5  Other Topics Concern   Not on file  Social History Narrative   Enjoys  family friends, sleeping, reading, relaxing   2 cats "Bruce and Bella"   No children   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 [place]   Social Determinants of Health   Financial Resource Strain: Low Risk  (01/21/2023)   Overall Financial Resource Strain (CARDIA)    Difficulty of Paying Living Expenses: Not very hard  Food Insecurity: No Food Insecurity (01/21/2023)   Hunger Vital Sign    Worried About Running Out of Food in the Last Year: Never true    Ran Out of Food in the Last Year: Never true  Transportation Needs: No Transportation Needs (01/21/2023)   PRAPARE - Administrator, Civil Service (Medical): No    Lack of Transportation (Non-Medical): No  Physical Activity: Not on file  Stress: Not on file  Social Connections: Unknown (01/21/2023)   Social Connection and Isolation Panel [NHANES]    Frequency of Communication with Friends and Family: More than three times a week    Frequency of Social Gatherings with Friends and Family: Once a week    Attends Religious Services: Patient declined    Database administrator or Organizations: Not on file    Attends Banker Meetings: Not on file    Marital Status: Living with partner  Intimate Partner Violence: Not on file    Outpatient Medications Prior to Visit  Medication Sig Dispense Refill   atorvastatin (LIPITOR) 80 MG tablet Take 1 tablet (80 mg total) by mouth daily. 90 tablet 1   Ferrous Sulfate (IRON) 325 (65 Fe) MG TABS Take 1 tablet (325 mg total) by mouth daily. 1 tablet by mouth twice daily 30 tablet 0   glipiZIDE (GLUCOTROL XL) 2.5 MG 24 hr tablet Take 1 tablet (2.5 mg total) by mouth daily with breakfast. 90 tablet 1   glucose blood (FREESTYLE TEST STRIPS) test strip Use as instructed (Patient taking differently: Use as instructed to check blood sugar 1 to 2 times daily.) 100 each 12   glucose monitoring kit (FREESTYLE) monitoring kit 1 each by Does not apply route as needed for other. 1 each 0    hydrochlorothiazide (HYDRODIURIL) 25 MG tablet Take 1 tablet (25 mg total) by mouth daily. 90 tablet 1   Lancets (FREESTYLE) lancets Use as instructed 100 each 12   levothyroxine (SYNTHROID) 50 MCG tablet Take 1 tablet (50 mcg total) by mouth daily before breakfast. 90 tablet 1   metFORMIN (GLUCOPHAGE) 1000 MG tablet Take 1 tablet (1,000 mg total) by mouth 2 (two) times daily with a meal. 180 tablet 1   metoprolol succinate (TOPROL-XL) 25 MG  24 hr tablet Take 1 tablet (25 mg total) by mouth daily. 90 tablet 1   Multiple Vitamins-Iron (MULTI-VITAMIN/IRON) TABS Take 1 tablet by mouth daily.  0   Omega-3 Fatty Acids (FISH OIL) 1000 MG CAPS Take 3 capsules (3,000 mg total) by mouth in the morning and at bedtime.  0   omeprazole (PRILOSEC) 20 MG capsule Take 1 capsule (20 mg total) by mouth daily. 90 capsule 1   spironolactone (ALDACTONE) 50 MG tablet Take 1 tablet (50 mg total) by mouth daily. 90 tablet 1   traZODone (DESYREL) 50 MG tablet Take 1 tablet (50 mg total) by mouth at bedtime. 90 tablet 1   metroNIDAZOLE (FLAGYL) 500 MG tablet Take 1 tablet (500 mg total) by mouth 2 (two) times daily. 14 tablet 0   No facility-administered medications prior to visit.    Allergies  Allergen Reactions   Amlodipine     flushing   Codeine     Allscripts Description: Codeine DerivativesAllscripts Description: Codeine Sulfate TABSAllscripts Description: Codeine Phosphate Powder Allscripts Description: Codeine Phosphate Powder   Other     CINNAMON GUM--blisters.    ROS See HPI    Objective:    Physical Exam Constitutional:      General: She is not in acute distress.    Appearance: Normal appearance. She is well-developed.  HENT:     Head: Normocephalic and atraumatic.     Right Ear: External ear normal.     Left Ear: External ear normal.  Eyes:     General: No scleral icterus. Neck:     Thyroid: No thyromegaly.  Cardiovascular:     Rate and Rhythm: Normal rate and regular rhythm.      Heart sounds: Normal heart sounds. No murmur heard. Pulmonary:     Effort: Pulmonary effort is normal. No respiratory distress.     Breath sounds: Normal breath sounds. No wheezing.  Musculoskeletal:        General: No swelling.     Cervical back: Neck supple.  Skin:    General: Skin is warm and dry.  Neurological:     Mental Status: She is alert and oriented to person, place, and time.  Psychiatric:        Mood and Affect: Mood normal.        Behavior: Behavior normal.        Thought Content: Thought content normal.        Judgment: Judgment normal.      BP 120/79 (BP Location: Left Arm, Patient Position: Sitting, Cuff Size: Large)   Pulse 74   Temp 98 F (36.7 C) (Oral)   Resp 16   Wt 281 lb (127.5 kg)   SpO2 99%   BMI 44.01 kg/m  Wt Readings from Last 3 Encounters:  01/24/23 281 lb (127.5 kg)  10/21/22 275 lb (124.7 kg)  06/15/22 274 lb (124.3 kg)       Assessment & Plan:   Problem List Items Addressed This Visit       Unprioritized   Iron deficiency anemia     On iron supplementation. -Continue iron supplementation. -Defer complete blood count due to patient's financial concerns.      HTN (hypertension)     Well controlled on Metoprolol and Aldactone. -Continue current regimen.      Hepatic adenoma    Clinically stable.  Continues annual follow up with Hepatology, Annamarie Major NP.      Diabetes type 2, controlled (HCC) - Primary    Lab  Results  Component Value Date   HGBA1C 6.3 06/15/2022   HGBA1C 7.2 (H) 01/14/2022   HGBA1C 7.2 (H) 09/04/2021   Lab Results  Component Value Date   MICROALBUR 1.0 09/04/2021   LDLCALC 107 (H) 06/15/2022   CREATININE 0.45 06/15/2022    Intermittent hypoglycemia, likely related to missed meals. Currently managed with Glipizide and Metformin. -Continue current regimen. -Consider discontinuing Glipizide if hypoglycemia becomes more frequent. -Check A1c and basic metabolic panel.      Relevant Orders   HgB  A1c   Basic Metabolic Panel (BMET)    I have discontinued Revonda Standard B. Storck's metroNIDAZOLE. I am also having her maintain her glucose monitoring kit, freestyle, glucose blood, Multi-Vitamin/Iron, Fish Oil, Iron, spironolactone, hydrochlorothiazide, glipiZIDE, metFORMIN, levothyroxine, traZODone, omeprazole, metoprolol succinate, and atorvastatin.  No orders of the defined types were placed in this encounter.

## 2023-03-02 ENCOUNTER — Other Ambulatory Visit (HOSPITAL_COMMUNITY): Payer: Self-pay | Admitting: Nurse Practitioner

## 2023-03-02 ENCOUNTER — Other Ambulatory Visit: Payer: Self-pay | Admitting: Nurse Practitioner

## 2023-03-02 DIAGNOSIS — D134 Benign neoplasm of liver: Secondary | ICD-10-CM

## 2023-03-05 ENCOUNTER — Other Ambulatory Visit: Payer: Self-pay | Admitting: Family

## 2023-03-06 ENCOUNTER — Other Ambulatory Visit (HOSPITAL_BASED_OUTPATIENT_CLINIC_OR_DEPARTMENT_OTHER): Payer: Self-pay

## 2023-03-06 MED ORDER — GLIPIZIDE ER 2.5 MG PO TB24
2.5000 mg | ORAL_TABLET | Freq: Every day | ORAL | 1 refills | Status: DC
  Filled 2023-03-06: qty 90, 90d supply, fill #0
  Filled 2023-06-05: qty 90, 90d supply, fill #1

## 2023-03-08 ENCOUNTER — Ambulatory Visit (HOSPITAL_COMMUNITY)
Admission: RE | Admit: 2023-03-08 | Discharge: 2023-03-08 | Disposition: A | Discharge status: Home / Self Care | Payer: Commercial Managed Care - PPO | Source: Ambulatory Visit | Attending: Nurse Practitioner | Admitting: Nurse Practitioner

## 2023-03-08 DIAGNOSIS — D134 Benign neoplasm of liver: Secondary | ICD-10-CM

## 2023-03-08 DIAGNOSIS — R160 Hepatomegaly, not elsewhere classified: Secondary | ICD-10-CM | POA: Diagnosis not present

## 2023-03-08 DIAGNOSIS — K760 Fatty (change of) liver, not elsewhere classified: Secondary | ICD-10-CM | POA: Diagnosis not present

## 2023-03-08 MED ORDER — GADOXETATE DISODIUM 0.25 MMOL/ML IV SOLN
10.0000 mL | Freq: Once | INTRAVENOUS | Status: AC | PRN
  Administered 2023-03-08: 10 mL via INTRAVENOUS

## 2023-03-17 DIAGNOSIS — H52223 Regular astigmatism, bilateral: Secondary | ICD-10-CM | POA: Diagnosis not present

## 2023-03-17 LAB — HM DIABETES EYE EXAM

## 2023-03-20 ENCOUNTER — Other Ambulatory Visit: Payer: Self-pay | Admitting: Family

## 2023-03-20 DIAGNOSIS — L680 Hirsutism: Secondary | ICD-10-CM

## 2023-03-21 ENCOUNTER — Other Ambulatory Visit: Payer: Self-pay

## 2023-03-21 ENCOUNTER — Other Ambulatory Visit (HOSPITAL_BASED_OUTPATIENT_CLINIC_OR_DEPARTMENT_OTHER): Payer: Self-pay

## 2023-03-21 MED ORDER — HYDROCHLOROTHIAZIDE 25 MG PO TABS
25.0000 mg | ORAL_TABLET | Freq: Every day | ORAL | 0 refills | Status: DC
  Filled 2023-03-21: qty 90, 90d supply, fill #0

## 2023-03-21 MED ORDER — SPIRONOLACTONE 50 MG PO TABS
50.0000 mg | ORAL_TABLET | Freq: Every day | ORAL | 0 refills | Status: DC
  Filled 2023-03-21: qty 90, 90d supply, fill #0

## 2023-03-24 DIAGNOSIS — K760 Fatty (change of) liver, not elsewhere classified: Secondary | ICD-10-CM | POA: Diagnosis not present

## 2023-03-24 DIAGNOSIS — D134 Benign neoplasm of liver: Secondary | ICD-10-CM | POA: Diagnosis not present

## 2023-06-19 ENCOUNTER — Other Ambulatory Visit: Payer: Self-pay | Admitting: Family

## 2023-06-19 DIAGNOSIS — L680 Hirsutism: Secondary | ICD-10-CM

## 2023-06-19 MED ORDER — HYDROCHLOROTHIAZIDE 25 MG PO TABS
25.0000 mg | ORAL_TABLET | Freq: Every day | ORAL | 0 refills | Status: DC
  Filled 2023-06-19: qty 90, 90d supply, fill #0

## 2023-06-19 MED ORDER — SPIRONOLACTONE 50 MG PO TABS
50.0000 mg | ORAL_TABLET | Freq: Every day | ORAL | 0 refills | Status: DC
  Filled 2023-06-19: qty 90, 90d supply, fill #0

## 2023-06-20 ENCOUNTER — Other Ambulatory Visit (HOSPITAL_BASED_OUTPATIENT_CLINIC_OR_DEPARTMENT_OTHER): Payer: Self-pay

## 2023-06-20 ENCOUNTER — Other Ambulatory Visit: Payer: Self-pay

## 2023-06-24 ENCOUNTER — Other Ambulatory Visit (HOSPITAL_BASED_OUTPATIENT_CLINIC_OR_DEPARTMENT_OTHER): Payer: Self-pay

## 2023-06-24 ENCOUNTER — Other Ambulatory Visit: Payer: Self-pay | Admitting: Family

## 2023-06-24 MED ORDER — METFORMIN HCL 1000 MG PO TABS
1000.0000 mg | ORAL_TABLET | Freq: Two times a day (BID) | ORAL | 0 refills | Status: DC
  Filled 2023-06-24: qty 180, 90d supply, fill #0

## 2023-06-24 MED ORDER — LEVOTHYROXINE SODIUM 50 MCG PO TABS
50.0000 ug | ORAL_TABLET | Freq: Every day | ORAL | 0 refills | Status: DC
  Filled 2023-06-24: qty 90, 90d supply, fill #0

## 2023-07-24 ENCOUNTER — Other Ambulatory Visit: Payer: Self-pay | Admitting: Family

## 2023-07-25 ENCOUNTER — Encounter: Payer: Self-pay | Admitting: *Deleted

## 2023-07-25 ENCOUNTER — Other Ambulatory Visit (HOSPITAL_BASED_OUTPATIENT_CLINIC_OR_DEPARTMENT_OTHER): Payer: Self-pay

## 2023-07-25 MED ORDER — TRAZODONE HCL 50 MG PO TABS
50.0000 mg | ORAL_TABLET | Freq: Every day | ORAL | 0 refills | Status: DC
  Filled 2023-07-25: qty 90, 90d supply, fill #0

## 2023-07-25 MED ORDER — OMEPRAZOLE 20 MG PO CPDR
20.0000 mg | DELAYED_RELEASE_CAPSULE | Freq: Every day | ORAL | 0 refills | Status: DC
  Filled 2023-07-25: qty 90, 90d supply, fill #0

## 2023-09-04 ENCOUNTER — Other Ambulatory Visit: Payer: Self-pay | Admitting: Family

## 2023-09-04 MED ORDER — ATORVASTATIN CALCIUM 80 MG PO TABS
80.0000 mg | ORAL_TABLET | Freq: Every day | ORAL | 1 refills | Status: DC
  Filled 2023-09-04: qty 90, 90d supply, fill #0

## 2023-09-04 MED ORDER — GLIPIZIDE ER 2.5 MG PO TB24
2.5000 mg | ORAL_TABLET | Freq: Every day | ORAL | 1 refills | Status: DC
  Filled 2023-09-04: qty 90, 90d supply, fill #0

## 2023-09-04 MED ORDER — METOPROLOL SUCCINATE ER 25 MG PO TB24
25.0000 mg | ORAL_TABLET | Freq: Every day | ORAL | 1 refills | Status: DC
  Filled 2023-09-04: qty 90, 90d supply, fill #0

## 2023-09-05 ENCOUNTER — Other Ambulatory Visit (HOSPITAL_BASED_OUTPATIENT_CLINIC_OR_DEPARTMENT_OTHER): Payer: Self-pay

## 2023-09-16 ENCOUNTER — Ambulatory Visit: Admitting: Family

## 2023-09-16 ENCOUNTER — Other Ambulatory Visit (HOSPITAL_BASED_OUTPATIENT_CLINIC_OR_DEPARTMENT_OTHER): Payer: Self-pay

## 2023-09-16 ENCOUNTER — Other Ambulatory Visit: Payer: Self-pay | Admitting: Family

## 2023-09-16 ENCOUNTER — Encounter: Payer: Self-pay | Admitting: Family

## 2023-09-16 VITALS — BP 129/79 | HR 85 | Temp 97.8°F | Resp 16 | Ht 67.0 in | Wt 281.0 lb

## 2023-09-16 DIAGNOSIS — G4700 Insomnia, unspecified: Secondary | ICD-10-CM | POA: Diagnosis not present

## 2023-09-16 DIAGNOSIS — Z7984 Long term (current) use of oral hypoglycemic drugs: Secondary | ICD-10-CM

## 2023-09-16 DIAGNOSIS — E785 Hyperlipidemia, unspecified: Secondary | ICD-10-CM

## 2023-09-16 DIAGNOSIS — I10 Essential (primary) hypertension: Secondary | ICD-10-CM

## 2023-09-16 DIAGNOSIS — D134 Benign neoplasm of liver: Secondary | ICD-10-CM | POA: Diagnosis not present

## 2023-09-16 DIAGNOSIS — L680 Hirsutism: Secondary | ICD-10-CM

## 2023-09-16 DIAGNOSIS — E039 Hypothyroidism, unspecified: Secondary | ICD-10-CM | POA: Diagnosis not present

## 2023-09-16 DIAGNOSIS — E119 Type 2 diabetes mellitus without complications: Secondary | ICD-10-CM | POA: Diagnosis not present

## 2023-09-16 DIAGNOSIS — K219 Gastro-esophageal reflux disease without esophagitis: Secondary | ICD-10-CM | POA: Diagnosis not present

## 2023-09-16 MED ORDER — GLIPIZIDE ER 2.5 MG PO TB24
2.5000 mg | ORAL_TABLET | Freq: Every day | ORAL | 1 refills | Status: DC
  Filled 2023-09-16 – 2023-12-08 (×2): qty 90, 90d supply, fill #0

## 2023-09-16 MED ORDER — ATORVASTATIN CALCIUM 80 MG PO TABS
80.0000 mg | ORAL_TABLET | Freq: Every day | ORAL | 1 refills | Status: DC
  Filled 2023-09-16 – 2023-12-08 (×2): qty 90, 90d supply, fill #0
  Filled 2024-03-11: qty 90, 90d supply, fill #1

## 2023-09-16 MED ORDER — FREESTYLE LIBRE 3 SENSOR MISC
5 refills | Status: DC
  Filled 2023-09-16: qty 6, 84d supply, fill #0
  Filled 2023-09-19: qty 2, 28d supply, fill #0
  Filled 2023-10-16: qty 2, 28d supply, fill #1
  Filled 2023-11-06: qty 2, 28d supply, fill #2
  Filled 2023-12-08: qty 2, 28d supply, fill #3
  Filled 2024-01-03: qty 2, 28d supply, fill #4
  Filled 2024-01-15: qty 2, 28d supply, fill #5

## 2023-09-16 MED ORDER — METFORMIN HCL 1000 MG PO TABS
1000.0000 mg | ORAL_TABLET | Freq: Two times a day (BID) | ORAL | 1 refills | Status: DC
  Filled 2023-09-16: qty 180, 90d supply, fill #0

## 2023-09-16 MED ORDER — TRAZODONE HCL 50 MG PO TABS
50.0000 mg | ORAL_TABLET | Freq: Every day | ORAL | 1 refills | Status: DC
  Filled 2023-09-16 – 2023-10-16 (×2): qty 90, 90d supply, fill #0
  Filled 2024-01-15: qty 90, 90d supply, fill #1

## 2023-09-16 MED ORDER — LEVOTHYROXINE SODIUM 50 MCG PO TABS
50.0000 ug | ORAL_TABLET | Freq: Every day | ORAL | 1 refills | Status: DC
  Filled 2023-09-16: qty 90, 90d supply, fill #0
  Filled 2023-12-21: qty 90, 90d supply, fill #1

## 2023-09-16 MED ORDER — METOPROLOL SUCCINATE ER 25 MG PO TB24
25.0000 mg | ORAL_TABLET | Freq: Every day | ORAL | 1 refills | Status: DC
  Filled 2023-09-16 – 2023-12-08 (×2): qty 90, 90d supply, fill #0
  Filled 2024-03-11: qty 90, 90d supply, fill #1

## 2023-09-16 MED ORDER — OMEPRAZOLE 20 MG PO CPDR
20.0000 mg | DELAYED_RELEASE_CAPSULE | Freq: Every day | ORAL | 1 refills | Status: DC
  Filled 2023-09-16 – 2023-10-16 (×2): qty 90, 90d supply, fill #0
  Filled 2024-01-15: qty 90, 90d supply, fill #1

## 2023-09-16 MED ORDER — SPIRONOLACTONE 50 MG PO TABS
50.0000 mg | ORAL_TABLET | Freq: Every day | ORAL | 1 refills | Status: DC
  Filled 2023-09-16: qty 90, 90d supply, fill #0
  Filled 2023-12-21: qty 90, 90d supply, fill #1

## 2023-09-16 NOTE — Patient Instructions (Signed)
 VISIT SUMMARY:  Today, we discussed your diabetes, hypertension, thyroid  management, reflux, and insomnia. We also addressed your concerns about weight gain and potential neuropathy.  YOUR PLAN:  TYPE 2 DIABETES MELLITUS: You are concerned about weight gain and anticipate a high A1c level due to poor dietary habits over the past five months. -We will order an A1c test to check your blood sugar levels. -Based on the A1c results and your insurance coverage, we may consider starting you on Mounjaro, a medication that can help with blood sugar control and weight loss. -We discussed the possible side effects of Mounjaro, including nausea and gastrointestinal discomfort. -We provided information on insurance and copay assistance for [facility].  HYPERTENSION: Your blood pressure is well-controlled at 129/79 mmHg with your current medications. -Continue taking hydrochlorothiazide , metoprolol , and spironolactone  as prescribed.  HYPOTHYROIDISM: Your thyroid  condition is stable with your current medication. -Continue taking Synthroid  as prescribed. -We will order thyroid  function tests to monitor your condition.  GASTROESOPHAGEAL REFLUX DISEASE (GERD): Your reflux symptoms are managed with omeprazole  but are triggered by tomato-based foods. -Continue taking omeprazole  as prescribed. -Try to avoid tomato-based foods and other triggers that worsen your reflux.  INSOMNIA: Your sleep is well-managed with trazodone . -Continue taking trazodone  as prescribed.  GENERAL HEALTH MAINTENANCE: You are due for a foot exam to check for signs of neuropathy related to diabetes. -We will perform a foot exam to check for signs of neuropathy. -Monitor for any symptoms of neuropathy, such as burning in your feet.  FOLLOW-UP: We need to follow up on your lab results and MRI scheduling. -Schedule a follow-up appointment to discuss your lab results and any necessary medication adjustments. -Remember to schedule your MRI  in [place] in the fall. We will send you a reminder for the order.

## 2023-09-16 NOTE — Assessment & Plan Note (Signed)
Stable on trazodone. Continue same. 

## 2023-09-16 NOTE — Assessment & Plan Note (Signed)
 Update A1C. Continue glucotrol /metformin . Consider addition of GLP-1.

## 2023-09-16 NOTE — Assessment & Plan Note (Signed)
 These are being followed by serial MRI's and Hepatology.

## 2023-09-16 NOTE — Assessment & Plan Note (Signed)
Stable on omeprazole. Continue same.  ?

## 2023-09-16 NOTE — Assessment & Plan Note (Signed)
 Non-fasting today. Wishes to defer lipid panel.

## 2023-09-16 NOTE — Assessment & Plan Note (Addendum)
 Lab Results  Component Value Date   TSH 2.09 06/15/2022   Clinically stable on synthroid . Continue same.

## 2023-09-16 NOTE — Assessment & Plan Note (Signed)
 We discussed possibility of GLP-1.  She will consider based on her A1C.

## 2023-09-16 NOTE — Progress Notes (Unsigned)
 Subjective:     Patient ID: Kristy Chung, female    DOB: 1985-09-29, 38 y.o.   MRN: [id number]  Chief Complaint  Patient presents with   Hypertension    Here for follow up   Diabetes    Here for follow up    Hypertension  Diabetes    Discussed the use of AI scribe software for clinical note transcription with the patient, who gave verbal consent to proceed.  History of Present Illness Wt Readings from Last 3 Encounters:  09/16/23 281 lb (127.5 kg)  01/24/23 281 lb (127.5 kg)  10/21/22 275 lb (124.7 kg)   Kristy Chung is a 38 year old female with diabetes and hypertension who presents for a follow-up visit.  She feels generally well on her current medication regimen, which includes hydrochlorothiazide , metoprolol , and spironolactone  for hypertension, and Synthroid  for thyroid  management. Her blood pressure is well-controlled at 129/79 mmHg.  She is concerned about weight gain and anticipates a high A1c level due to poor dietary habits over the past five months, attributed to work-related stress.  She experiences intermittent burning in her feet after activity, raising concern for possible neuropathy.  Her current medications also include trazodone  for sleep, which she finds helpful, and omeprazole  for reflux, which is effective. Reflux symptoms are exacerbated by tomato-based foods.      Health Maintenance Due  Topic Date Due   Pneumococcal Vaccine 33-41 Years old (2 of 2 - PCV) 09/22/2014   Diabetic kidney evaluation - Urine ACR  09/05/2022   COVID-19 Vaccine (3 - 2024-25 season) 01/16/2023   FOOT EXAM  06/16/2023   HEMOGLOBIN A1C  07/24/2023    Past Medical History:  Diagnosis Date   Allergy    Diabetes mellitus without complication (HCC)    Hepatic adenoma    benign; related to birth control   History of chicken pox    Hyperlipidemia    Hypertension    Hypothyroid    Iron  deficiency anemia 06/20/2019    Past Surgical History:  Procedure  Laterality Date   WISDOM TOOTH EXTRACTION  2005    Family History  Problem Relation Age of Onset   Hyperlipidemia Mother    Depression Mother    Hyperlipidemia Father    Hypertension Father    Hypothyroidism Maternal Grandmother    Heart disease Maternal Grandfather        MI, stent, CAD   Hypertension Maternal Grandfather    Hyperlipidemia Maternal Grandfather    COPD Paternal Grandmother    Hypertension Paternal Grandmother    Hyperlipidemia Paternal Grandmother    Heart disease Paternal Grandmother    AAA (abdominal aortic aneurysm) Paternal Grandfather    Dementia Paternal Grandfather    Heart disease Paternal Grandfather     Social History   Socioeconomic History   Marital status: Single    Spouse name: Not on file   Number of children: Not on file   Years of education: Not on file   Highest education level: Bachelor's degree (e.g., BA, AB, BS)  Occupational History   Not on file  Tobacco Use   Smoking status: Never   Smokeless tobacco: Never  Vaping Use   Vaping status: Never Used  Substance and Sexual Activity   Alcohol use: Yes    Alcohol/week: 0.0 standard drinks of alcohol    Comment: occasional   Drug use: No   Sexual activity: Yes    Partners: Male    Birth control/protection: None  Comment: INTERCOURSE AGE 54, SEXUAL PARTNERS 5  Other Topics Concern   Not on file  Social History Narrative   Enjoys family friends, sleeping, reading, relaxing   2 cats "Bruce and Bella"   No children   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 [place]   Social Drivers of Health   Financial Resource Strain: Low Risk  (09/15/2023)   Overall Financial Resource Strain (CARDIA)    Difficulty of Paying Living Expenses: Not hard at all  Food Insecurity: No Food Insecurity (09/15/2023)   Hunger Vital Sign    Worried About Running Out of Food in the Last Year: Never true    Ran Out of Food in the Last Year: Never true  Transportation Needs: No Transportation Needs  (09/15/2023)   PRAPARE - Administrator, Civil Service (Medical): No    Lack of Transportation (Non-Medical): No  Physical Activity: Insufficiently Active (09/15/2023)   Exercise Vital Sign    Days of Exercise per Week: 3 days    Minutes of Exercise per Session: 30 min  Stress: Stress Concern Present (09/15/2023)   [employer] of Occupational Health - Occupational Stress Questionnaire    Feeling of Stress : To some extent  Social Connections: Moderately Isolated (09/15/2023)   Social Connection and Isolation Panel [NHANES]    Frequency of Communication with Friends and Family: More than three times a week    Frequency of Social Gatherings with Friends and Family: Twice a week    Attends Religious Services: Never    Database administrator or Organizations: No    Attends Engineer, structural: Not on file    Marital Status: Living with partner  Intimate Partner Violence: Not on file    Outpatient Medications Prior to Visit  Medication Sig Dispense Refill   Ferrous Sulfate (IRON ) 325 (65 Fe) MG TABS Take 1 tablet (325 mg total) by mouth daily. 1 tablet by mouth twice daily 30 tablet 0   glucose blood (FREESTYLE TEST STRIPS) test strip Use as instructed (Patient taking differently: Use as instructed to check blood sugar 1 to 2 times daily.) 100 each 12   hydrochlorothiazide  (HYDRODIURIL ) 25 MG tablet Take 1 tablet (25 mg total) by mouth daily. 90 tablet 0   Multiple Vitamins-Iron  (MULTI-VITAMIN/IRON ) TABS Take 1 tablet by mouth daily.  0   Omega-3 Fatty Acids (FISH OIL ) 1000 MG CAPS Take 3 capsules (3,000 mg total) by mouth in the morning and at bedtime.  0   atorvastatin  (LIPITOR) 80 MG tablet Take 1 tablet (80 mg total) by mouth daily. 90 tablet 1   glipiZIDE  (GLUCOTROL  XL) 2.5 MG 24 hr tablet Take 1 tablet (2.5 mg total) by mouth daily with breakfast. 90 tablet 1   glucose monitoring kit (FREESTYLE) monitoring kit 1 each by Does not apply route as needed for other. 1  each 0   Lancets (FREESTYLE) lancets Use as instructed 100 each 12   levothyroxine  (SYNTHROID ) 50 MCG tablet Take 1 tablet (50 mcg total) by mouth daily before breakfast. 90 tablet 0   metFORMIN  (GLUCOPHAGE ) 1000 MG tablet Take 1 tablet (1,000 mg total) by mouth 2 (two) times daily with a meal. 180 tablet 0   metoprolol  succinate (TOPROL -XL) 25 MG 24 hr tablet Take 1 tablet (25 mg total) by mouth daily. 90 tablet 1   omeprazole  (PRILOSEC) 20 MG capsule Take 1 capsule (20 mg total) by mouth daily. 90 capsule 0   spironolactone  (ALDACTONE )  50 MG tablet Take 1 tablet (50 mg total) by mouth daily. 90 tablet 0   traZODone  (DESYREL ) 50 MG tablet Take 1 tablet (50 mg total) by mouth at bedtime. 90 tablet 0   No facility-administered medications prior to visit.    Allergies  Allergen Reactions   Amlodipine      flushing   Codeine     Allscripts Description: Codeine DerivativesAllscripts Description: Codeine Sulfate TABSAllscripts Description: Codeine Phosphate Powder Allscripts Description: Codeine Phosphate Powder   Other     CINNAMON GUM--blisters.    ROS See HPI    Objective:    Physical Exam Constitutional:      General: She is not in acute distress.    Appearance: Normal appearance. She is well-developed.  HENT:     Head: Normocephalic and atraumatic.     Right Ear: External ear normal.     Left Ear: External ear normal.  Eyes:     General: No scleral icterus. Neck:     Thyroid : No thyromegaly.  Cardiovascular:     Rate and Rhythm: Normal rate and regular rhythm.     Heart sounds: Normal heart sounds. No murmur heard. Pulmonary:     Effort: Pulmonary effort is normal. No respiratory distress.     Breath sounds: Normal breath sounds. No wheezing.  Musculoskeletal:     Cervical back: Neck supple.  Skin:    General: Skin is warm and dry.  Neurological:     Mental Status: She is alert and oriented to person, place, and time.  Psychiatric:        Mood and Affect: Mood  normal.        Behavior: Behavior normal.        Thought Content: Thought content normal.        Judgment: Judgment normal.      BP 129/79 (BP Location: Right Arm, Patient Position: Sitting)   Pulse 85   Temp 97.8 F (36.6 C) (Oral)   Resp 16   Ht 5\' 7"  (1.702 m)   Wt 281 lb (127.5 kg)   SpO2 99%   BMI 44.01 kg/m  Wt Readings from Last 3 Encounters:  09/16/23 281 lb (127.5 kg)  01/24/23 281 lb (127.5 kg)  10/21/22 275 lb (124.7 kg)       Assessment & Plan:   Problem List Items Addressed This Visit       Unprioritized   Morbid obesity (HCC)   We discussed possibility of GLP-1.  She will consider based on her A1C.       Relevant Medications   metFORMIN  (GLUCOPHAGE ) 1000 MG tablet   glipiZIDE  (GLUCOTROL  XL) 2.5 MG 24 hr tablet   Insomnia   Stable on trazodone . Continue same.      Relevant Medications   traZODone  (DESYREL ) 50 MG tablet   Hypothyroidism   Lab Results  Component Value Date   TSH 2.09 06/15/2022   Clinically stable on synthroid . Continue same.       Relevant Medications   metoprolol  succinate (TOPROL -XL) 25 MG 24 hr tablet   levothyroxine  (SYNTHROID ) 50 MCG tablet   Hyperlipidemia   Non-fasting today. Wishes to defer lipid panel.      Relevant Medications   spironolactone  (ALDACTONE ) 50 MG tablet   metoprolol  succinate (TOPROL -XL) 25 MG 24 hr tablet   atorvastatin  (LIPITOR) 80 MG tablet   HTN (hypertension) - Primary   BP Readings from Last 3 Encounters:  09/16/23 129/79  01/24/23 120/79  10/21/22 122/72   Stable on  current meds.       Relevant Medications   spironolactone  (ALDACTONE ) 50 MG tablet   metoprolol  succinate (TOPROL -XL) 25 MG 24 hr tablet   atorvastatin  (LIPITOR) 80 MG tablet   Other Relevant Orders   TSH   Hepatic adenoma   These are being followed by serial MRI's and Hepatology.       GERD (gastroesophageal reflux disease)   Stable on omeprazole . Continue same.       Relevant Medications   omeprazole   (PRILOSEC) 20 MG capsule   Diabetes type 2, controlled (HCC)   Update A1C. Continue glucotrol /metformin . Consider addition of GLP-1.      Relevant Medications   metFORMIN  (GLUCOPHAGE ) 1000 MG tablet   Continuous Glucose Sensor (FREESTYLE LIBRE 3 SENSOR) MISC   glipiZIDE  (GLUCOTROL  XL) 2.5 MG 24 hr tablet   atorvastatin  (LIPITOR) 80 MG tablet   Other Relevant Orders   HgB A1c   Urine Microalbumin w/creat. ratio   Basic Metabolic Panel (BMET)   Other Visit Diagnoses       Hirsutism       Relevant Medications   spironolactone  (ALDACTONE ) 50 MG tablet       I have discontinued Frankie B. Pizzolato's glucose monitoring kit and freestyle. I am also having her start on FreeStyle Libre 3 Sensor. Additionally, I am having her maintain her glucose blood, Multi-Vitamin/Iron , Fish Oil , Iron , hydrochlorothiazide , spironolactone , traZODone , omeprazole , metoprolol  succinate, metFORMIN , levothyroxine , glipiZIDE , and atorvastatin .  Meds ordered this encounter  Medications   spironolactone  (ALDACTONE ) 50 MG tablet    Sig: Take 1 tablet (50 mg total) by mouth daily.    Dispense:  90 tablet    Refill:  1    Supervising Provider:   Randie Bustle A [[id number]]   traZODone  (DESYREL ) 50 MG tablet    Sig: Take 1 tablet (50 mg total) by mouth at bedtime.    Dispense:  90 tablet    Refill:  1    Supervising Provider:   Randie Bustle A [[id number]]   omeprazole  (PRILOSEC) 20 MG capsule    Sig: Take 1 capsule (20 mg total) by mouth daily.    Dispense:  90 capsule    Refill:  1    Supervising Provider:   Randie Bustle A [[id number]]   metoprolol  succinate (TOPROL -XL) 25 MG 24 hr tablet    Sig: Take 1 tablet (25 mg total) by mouth daily.    Dispense:  90 tablet    Refill:  1    Supervising Provider:   Randie Bustle A [[id number]]   metFORMIN  (GLUCOPHAGE ) 1000 MG tablet    Sig: Take 1 tablet (1,000 mg total) by mouth 2 (two) times daily with a meal.    Dispense:  180 tablet    Refill:  1    Supervising Provider:   Randie Bustle A [[id number]]   levothyroxine  (SYNTHROID ) 50 MCG tablet    Sig: Take 1 tablet (50 mcg total) by mouth daily before breakfast.    Dispense:  90 tablet    Refill:  1    Supervising Provider:   Randie Bustle A [[id number]]   Continuous Glucose Sensor (FREESTYLE LIBRE 3 SENSOR) MISC    Sig: Change every 14 days    Dispense:  6 each    Refill:  5    Supervising Provider:   Randie Bustle A [[id number]]   glipiZIDE  (GLUCOTROL  XL) 2.5 MG 24 hr tablet    Sig: Take 1 tablet (2.5 mg total) by mouth daily with breakfast.  Dispense:  90 tablet    Refill:  1    Supervising Provider:   Randie Bustle A [[id number]]   atorvastatin  (LIPITOR) 80 MG tablet    Sig: Take 1 tablet (80 mg total) by mouth daily.    Dispense:  90 tablet    Refill:  1    Supervising Provider:   Randie Bustle A [[id number]]

## 2023-09-16 NOTE — Assessment & Plan Note (Signed)
 BP Readings from Last 3 Encounters:  09/16/23 129/79  01/24/23 120/79  10/21/22 122/72   Stable on current meds.

## 2023-09-17 LAB — MICROALBUMIN / CREATININE URINE RATIO
Creatinine, Urine: 79 mg/dL (ref 20–275)
Microalb, Ur: <0.2 mg/dL

## 2023-09-17 LAB — HEMOGLOBIN A1C
Hgb A1c MFr Bld: 7.5 % — ABNORMAL HIGH (ref <5.7)
Mean Plasma Glucose: 169 mg/dL
eAG (mmol/L): 9.3 mmol/L

## 2023-09-17 LAB — BASIC METABOLIC PANEL WITH GFR
BUN: 10 mg/dL (ref 7–25)
CO2: 23 mmol/L (ref 20–32)
Calcium: 10.1 mg/dL (ref 8.6–10.2)
Chloride: 99 mmol/L (ref 98–110)
Creat: 0.63 mg/dL (ref 0.50–0.97)
Glucose, Bld: 146 mg/dL — ABNORMAL HIGH (ref 65–99)
Potassium: 3.8 mmol/L (ref 3.5–5.3)
Sodium: 135 mmol/L (ref 135–146)
eGFR: 117 mL/min/{1.73_m2} (ref >=60)

## 2023-09-17 LAB — TSH: TSH: 1.41 m[IU]/L

## 2023-09-17 MED ORDER — HYDROCHLOROTHIAZIDE 25 MG PO TABS
25.0000 mg | ORAL_TABLET | Freq: Every day | ORAL | 0 refills | Status: DC
Start: 2023-09-17 — End: 2023-12-21
  Filled 2023-09-17: qty 90, 90d supply, fill #0

## 2023-09-18 ENCOUNTER — Other Ambulatory Visit (HOSPITAL_BASED_OUTPATIENT_CLINIC_OR_DEPARTMENT_OTHER): Payer: Self-pay

## 2023-09-19 ENCOUNTER — Telehealth: Payer: Self-pay | Admitting: Family

## 2023-09-19 ENCOUNTER — Other Ambulatory Visit (HOSPITAL_BASED_OUTPATIENT_CLINIC_OR_DEPARTMENT_OTHER): Payer: Self-pay

## 2023-09-19 ENCOUNTER — Other Ambulatory Visit (HOSPITAL_COMMUNITY): Payer: Self-pay

## 2023-09-19 MED ORDER — TIRZEPATIDE 2.5 MG/0.5ML ~~LOC~~ SOAJ
2.5000 mg | SUBCUTANEOUS | 0 refills | Status: DC
  Filled 2023-09-19: qty 2, 28d supply, fill #0

## 2023-09-19 NOTE — Telephone Encounter (Signed)
 See mychart.

## 2023-09-20 ENCOUNTER — Other Ambulatory Visit (HOSPITAL_BASED_OUTPATIENT_CLINIC_OR_DEPARTMENT_OTHER): Payer: Self-pay

## 2023-09-26 ENCOUNTER — Other Ambulatory Visit (HOSPITAL_BASED_OUTPATIENT_CLINIC_OR_DEPARTMENT_OTHER): Payer: Self-pay

## 2023-09-27 ENCOUNTER — Other Ambulatory Visit (HOSPITAL_BASED_OUTPATIENT_CLINIC_OR_DEPARTMENT_OTHER): Payer: Self-pay

## 2023-09-28 ENCOUNTER — Other Ambulatory Visit (HOSPITAL_BASED_OUTPATIENT_CLINIC_OR_DEPARTMENT_OTHER): Payer: Self-pay

## 2023-09-28 ENCOUNTER — Telehealth: Payer: Self-pay

## 2023-09-28 ENCOUNTER — Other Ambulatory Visit (HOSPITAL_COMMUNITY): Payer: Self-pay

## 2023-09-28 ENCOUNTER — Other Ambulatory Visit: Payer: Self-pay | Admitting: Family

## 2023-09-28 ENCOUNTER — Other Ambulatory Visit: Payer: Self-pay

## 2023-09-28 DIAGNOSIS — E119 Type 2 diabetes mellitus without complications: Secondary | ICD-10-CM

## 2023-09-28 MED ORDER — TIRZEPATIDE 2.5 MG/0.5ML ~~LOC~~ SOAJ
2.5000 mg | SUBCUTANEOUS | 0 refills | Status: DC
Start: 2023-09-28 — End: 2023-09-29
  Filled 2023-09-28: qty 2, 28d supply, fill #0

## 2023-09-28 NOTE — Telephone Encounter (Signed)
 Pharmacy Patient Advocate Encounter   Received notification from Patient Advice Request messages that prior authorization for Mounjaro  2.5mg /0.64ml is required/requested.   Insurance verification completed.   The patient is insured through [facility] .   Per test claim: The ICD10 code has to be added to the order in order for it to be filled. Can you please send over a new order with the ICD10?

## 2023-09-29 ENCOUNTER — Other Ambulatory Visit (HOSPITAL_BASED_OUTPATIENT_CLINIC_OR_DEPARTMENT_OTHER): Payer: Self-pay

## 2023-09-29 MED ORDER — TIRZEPATIDE 2.5 MG/0.5ML ~~LOC~~ SOAJ
2.5000 mg | SUBCUTANEOUS | 0 refills | Status: DC
  Filled 2023-09-29 (×2): qty 2, 28d supply, fill #0

## 2023-09-29 NOTE — Telephone Encounter (Signed)
 Rx sent with icd code added to comments

## 2023-09-29 NOTE — Addendum Note (Signed)
 Addended by: Joye Nobles on: 09/29/2023 10:14 AM   Modules accepted: Orders

## 2023-10-17 ENCOUNTER — Other Ambulatory Visit (HOSPITAL_BASED_OUTPATIENT_CLINIC_OR_DEPARTMENT_OTHER): Payer: Self-pay

## 2023-10-23 ENCOUNTER — Encounter: Payer: Self-pay | Admitting: Family

## 2023-10-24 ENCOUNTER — Other Ambulatory Visit (HOSPITAL_BASED_OUTPATIENT_CLINIC_OR_DEPARTMENT_OTHER): Payer: Self-pay

## 2023-10-24 MED ORDER — METFORMIN HCL 500 MG PO TABS
500.0000 mg | ORAL_TABLET | Freq: Two times a day (BID) | ORAL | 1 refills | Status: DC
  Filled 2023-10-24: qty 180, 90d supply, fill #0
  Filled 2024-01-15: qty 180, 90d supply, fill #1

## 2023-10-24 MED ORDER — TIRZEPATIDE 5 MG/0.5ML ~~LOC~~ SOAJ
5.0000 mg | SUBCUTANEOUS | 1 refills | Status: DC
  Filled 2023-10-24: qty 2, 28d supply, fill #0

## 2023-10-31 NOTE — Progress Notes (Deleted)
   Kristy Chung 17-Jun-1985 [id number]   History:  37 y.o. G0 presents for annual exam. Monthly cycles. Cycles used to be irregular until switching to day shift and losing weight. Normal pap history. DM, HTN, and hypothyroidism managed by PCP. History of liver adenoma that decreased in size after stopping OCPs. No contraception, OK with pregnancy.   Gynecologic History No LMP recorded.   Contraception/Family planning: none Sexually active: Yes  Health Maintenance Last Pap: 16/10/2022. Results were: Kristy Chung neg HPV Last mammogram: Not indicated Last colonoscopy: Not indicated Last Dexa: Not indicated  Past medical history, past surgical history, family history and social history were all reviewed and documented in the EPIC chart. 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  ROS:  A ROS was performed and pertinent positives and negatives are included.  Exam:  There were no vitals filed for this visit.    There is no height or weight on file to calculate BMI.  General appearance:  Normal Thyroid :  Symmetrical, normal in size, without palpable masses or nodularity. Respiratory  Auscultation:  Clear without wheezing or rhonchi Cardiovascular  Auscultation:  Regular rate, without rubs, murmurs or gallops  Edema/varicosities:  Not grossly evident Abdominal  Soft,nontender, without masses, guarding or rebound.  Liver/spleen:  No organomegaly noted  Hernia:  None appreciated  Skin  Inspection:  Grossly normal Breasts: Examined lying and sitting.   Right: Without masses, retractions, discharge or axillary adenopathy.   Left: Without masses, retractions, discharge or axillary adenopathy. Pelvic: External genitalia:  no lesions              Urethra:  normal appearing urethra with no masses, tenderness or lesions              Bartholins and Skenes: normal                 Vagina: normal appearing vagina with normal color and discharge, no lesions              Cervix: no  lesions Bimanual Exam:  Uterus:  no masses or tenderness              Adnexa: no mass, fullness, tenderness              Rectovaginal: Deferred              Anus:  normal, no lesions  Kristy Chung, CMA present as chaperone.   Assessment/Plan:  38 y.o. G0 for annual exam.   Well female exam with routine gynecological exam - Education provided on SBEs, importance of preventative screenings, current guidelines, high calcium  diet, regular exercise, and multivitamin daily. Labs with PCP.   Screening for cervical cancer - Normal pap history. Will repeat at 5-year interval per guidelines.   No follow-ups on file.     Kristy Chung [facility], 2:24 PM 10/31/2023

## 2023-11-01 ENCOUNTER — Ambulatory Visit: Admitting: Nurse Practitioner

## 2023-11-01 DIAGNOSIS — Z1331 Encounter for screening for depression: Secondary | ICD-10-CM

## 2023-11-01 DIAGNOSIS — Z01419 Encounter for gynecological examination (general) (routine) without abnormal findings: Secondary | ICD-10-CM

## 2023-11-07 ENCOUNTER — Other Ambulatory Visit (HOSPITAL_BASED_OUTPATIENT_CLINIC_OR_DEPARTMENT_OTHER): Payer: Self-pay

## 2023-11-21 ENCOUNTER — Other Ambulatory Visit (HOSPITAL_BASED_OUTPATIENT_CLINIC_OR_DEPARTMENT_OTHER): Payer: Self-pay

## 2023-11-21 MED ORDER — TIRZEPATIDE 7.5 MG/0.5ML ~~LOC~~ SOAJ
7.5000 mg | SUBCUTANEOUS | 1 refills | Status: DC
Start: 2023-11-21 — End: 2024-01-15
  Filled 2023-11-21: qty 2, 28d supply, fill #0
  Filled 2023-12-21: qty 2, 28d supply, fill #1

## 2023-11-21 NOTE — Addendum Note (Signed)
 Addended by: DARYL SETTER on: 11/21/2023 12:30 PM   Modules accepted: Orders

## 2023-11-24 ENCOUNTER — Other Ambulatory Visit: Payer: Self-pay

## 2023-12-08 ENCOUNTER — Encounter: Payer: Self-pay | Admitting: Nurse Practitioner

## 2023-12-08 ENCOUNTER — Ambulatory Visit (INDEPENDENT_AMBULATORY_CARE_PROVIDER_SITE_OTHER): Admitting: Nurse Practitioner

## 2023-12-08 VITALS — BP 128/80 | HR 83 | Ht 67.0 in | Wt 264.0 lb

## 2023-12-08 DIAGNOSIS — Z3009 Encounter for other general counseling and advice on contraception: Secondary | ICD-10-CM

## 2023-12-08 DIAGNOSIS — Z01419 Encounter for gynecological examination (general) (routine) without abnormal findings: Secondary | ICD-10-CM

## 2023-12-08 DIAGNOSIS — Z1331 Encounter for screening for depression: Secondary | ICD-10-CM

## 2023-12-08 NOTE — Progress Notes (Signed)
 Kristy Chung Nov 28, 1985 [id number]   38 y.o. G0 presents for annual exam. Cycles have been more regular since switching to day shift and losing weight. Ranging between 4-6 weeks. Normal pap history. DM, HTN, and hypothyroidism managed by PCP. Down 20 pounds with Mounjaro . History of liver adenoma that decreased in size after stopping OCPs. Considering contraception - IUD versus BTL.   Gynecologic History Patient's last menstrual period was 11/28/2023 (approximate). Period Cycle (Days): 28 Period Duration (Days): 5 Period Pattern: (!) Irregular Menstrual Flow: Moderate Menstrual Control: Tampon Menstrual Control Change Freq (Hours): 4 Dysmenorrhea: (!) Moderate Dysmenorrhea Symptoms: Cramping, Headache, Nausea Contraception/Family planning: none Sexually active: Yes  Health Maintenance Last Pap: 10/21/2022. Results were: Normal neg HPV Last mammogram: Not indicated Last colonoscopy: Not indicated Last Dexa: Not indicated     12/08/2023    9:35 AM  Depression screen PHQ 2/9  Down, Depressed, Hopeless 0  PHQ - 2 Score 0     Past medical history, past surgical history, family history and social history were all reviewed and documented in the EPIC chart. 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  ROS:  A ROS was performed and pertinent positives and negatives are included.  Exam:  Vitals:   12/08/23 0935  BP: 128/80  Pulse: 83  SpO2: 97%  Weight: 264 lb (119.7 kg)  Height: 5' 7 (1.702 m)      Body mass index is 41.35 kg/m.  General appearance:  Normal Thyroid :  Symmetrical, normal in size, without palpable masses or nodularity. Respiratory  Auscultation:  Clear without wheezing or rhonchi Cardiovascular  Auscultation:  Regular rate, without rubs, murmurs or gallops  Edema/varicosities:  Not grossly evident Abdominal  Soft,nontender, without masses, guarding or rebound.  Liver/spleen:  No organomegaly noted  Hernia:  None appreciated   Skin  Inspection:  Grossly normal Breasts: Examined lying and sitting.   Right: Without masses, retractions, discharge or axillary adenopathy.   Left: Without masses, retractions, discharge or axillary adenopathy. Pelvic: External genitalia:  no lesions              Urethra:  normal appearing urethra with no masses, tenderness or lesions              Bartholins and Skenes: normal                 Vagina: normal appearing vagina with normal color and discharge, no lesions              Cervix: no lesions Bimanual Exam:  Uterus:  no masses or tenderness              Adnexa: no mass, fullness, tenderness              Rectovaginal: Deferred              Anus:  normal, no lesions  Kristy Chung, CMA present as chaperone.   Assessment/Plan:  38 y.o. G0 for annual exam.   Well female exam with routine gynecological exam - Education provided on SBEs, importance of preventative screenings, current guidelines, high calcium  diet, regular exercise, and multivitamin daily. Labs with PCP.   General counseling and advice on female contraception - OCPs contraindicated due to liver adenoma. Considering IUD or BTL. Also discussed Phexxi as a non-hormonal option.   Screening for cervical cancer - Normal pap history. Will repeat at 5-year interval per guidelines.   Return in about 1 year (around 12/07/2024) for Annual.     Kristy Chung  Kristy Chung [facility], 9:53 AM 12/08/2023

## 2023-12-08 NOTE — Patient Instructions (Signed)

## 2023-12-09 ENCOUNTER — Other Ambulatory Visit (HOSPITAL_BASED_OUTPATIENT_CLINIC_OR_DEPARTMENT_OTHER): Payer: Self-pay

## 2023-12-21 ENCOUNTER — Other Ambulatory Visit (HOSPITAL_BASED_OUTPATIENT_CLINIC_OR_DEPARTMENT_OTHER): Payer: Self-pay

## 2023-12-21 ENCOUNTER — Other Ambulatory Visit: Payer: Self-pay

## 2023-12-21 ENCOUNTER — Other Ambulatory Visit: Payer: Self-pay | Admitting: Family

## 2023-12-21 MED ORDER — HYDROCHLOROTHIAZIDE 25 MG PO TABS
25.0000 mg | ORAL_TABLET | Freq: Every day | ORAL | 0 refills | Status: DC
  Filled 2023-12-21: qty 90, 90d supply, fill #0

## 2024-01-03 ENCOUNTER — Encounter: Payer: Self-pay | Admitting: Family

## 2024-01-04 ENCOUNTER — Other Ambulatory Visit (HOSPITAL_BASED_OUTPATIENT_CLINIC_OR_DEPARTMENT_OTHER): Payer: Self-pay

## 2024-01-15 ENCOUNTER — Other Ambulatory Visit: Payer: Self-pay | Admitting: Family

## 2024-01-16 ENCOUNTER — Other Ambulatory Visit (HOSPITAL_BASED_OUTPATIENT_CLINIC_OR_DEPARTMENT_OTHER): Payer: Self-pay

## 2024-01-17 ENCOUNTER — Other Ambulatory Visit (HOSPITAL_BASED_OUTPATIENT_CLINIC_OR_DEPARTMENT_OTHER): Payer: Self-pay

## 2024-01-17 ENCOUNTER — Other Ambulatory Visit: Payer: Self-pay

## 2024-01-17 MED ORDER — MOUNJARO 7.5 MG/0.5ML ~~LOC~~ SOAJ
7.5000 mg | SUBCUTANEOUS | 0 refills | Status: DC
Start: 2024-01-17 — End: 2024-01-20
  Filled 2024-01-17: qty 2, 28d supply, fill #0

## 2024-01-20 ENCOUNTER — Other Ambulatory Visit (HOSPITAL_BASED_OUTPATIENT_CLINIC_OR_DEPARTMENT_OTHER): Payer: Self-pay

## 2024-01-20 ENCOUNTER — Ambulatory Visit: Admitting: Family

## 2024-01-20 ENCOUNTER — Encounter: Payer: Self-pay | Admitting: Family

## 2024-01-20 VITALS — BP 122/73 | HR 86 | Temp 98.6°F | Resp 16 | Ht 67.0 in | Wt 266.5 lb

## 2024-01-20 DIAGNOSIS — E119 Type 2 diabetes mellitus without complications: Secondary | ICD-10-CM

## 2024-01-20 DIAGNOSIS — E039 Hypothyroidism, unspecified: Secondary | ICD-10-CM | POA: Diagnosis not present

## 2024-01-20 DIAGNOSIS — D509 Iron deficiency anemia, unspecified: Secondary | ICD-10-CM

## 2024-01-20 DIAGNOSIS — E785 Hyperlipidemia, unspecified: Secondary | ICD-10-CM

## 2024-01-20 DIAGNOSIS — G4700 Insomnia, unspecified: Secondary | ICD-10-CM | POA: Diagnosis not present

## 2024-01-20 DIAGNOSIS — Z7985 Long-term (current) use of injectable non-insulin antidiabetic drugs: Secondary | ICD-10-CM

## 2024-01-20 DIAGNOSIS — I10 Essential (primary) hypertension: Secondary | ICD-10-CM

## 2024-01-20 DIAGNOSIS — G43909 Migraine, unspecified, not intractable, without status migrainosus: Secondary | ICD-10-CM

## 2024-01-20 DIAGNOSIS — K219 Gastro-esophageal reflux disease without esophagitis: Secondary | ICD-10-CM

## 2024-01-20 MED ORDER — TIRZEPATIDE 10 MG/0.5ML ~~LOC~~ SOAJ
10.0000 mg | SUBCUTANEOUS | 0 refills | Status: DC
  Filled 2024-01-20: qty 6, 84d supply, fill #0
  Filled 2024-02-12: qty 2, 28d supply, fill #0
  Filled 2024-03-11: qty 2, 28d supply, fill #1
  Filled 2024-04-01 – 2024-04-03 (×2): qty 2, 28d supply, fill #2

## 2024-01-20 MED ORDER — FREESTYLE LIBRE 3 PLUS SENSOR MISC
5 refills | Status: AC
  Filled 2024-01-20 (×2): qty 6, 90d supply, fill #0
  Filled 2024-04-17: qty 6, 90d supply, fill #1
  Filled 2024-06-03: qty 6, 84d supply, fill #2

## 2024-01-20 NOTE — Assessment & Plan Note (Signed)
No recent migraines.  

## 2024-01-20 NOTE — Progress Notes (Signed)
 Subjective:     Patient ID: Kristy Chung, female    DOB: 1985/12/17, 38 y.o.   MRN: [id number]  Chief Complaint  Patient presents with   Follow-up    HPI  Discussed the use of AI scribe software for clinical note transcription with the patient, who gave verbal consent to proceed.  History of Present Illness  Kristy Chung is a 38 year old female with diabetes and hypertension who presents for a follow-up on medications.  She discusses her diabetes management, noting a switch from Freestyle 3 to Freestyle 3 Plus for glucose monitoring. Her blood sugars have stabilized since discontinuing glipizide , which previously caused hypoglycemia. She has lost weight, decreasing from 281 to 266 pounds since May She feels that her weight has started to level off.  Noting current premenstrual bloating. She is on Mounjaro  7.5 mg, metformin  500 mg twice daily, and has stopped glipizide .  She takes omeprazole  daily for reflux, which worsens with tomato-based foods. Hydrochlorothiazide  and Aldactone  are part of her regimen, with Aldactone  primarily for acne management. Discontinuation of Aldactone  leads to acne flare-ups. She is on Synthroid , with TSH levels checked in May, and her cholesterol was last checked in January 2024, showing an LDL of 107. She takes iron  supplements, 65 mg daily, with a normal blood count over a year ago.  Headaches occur around her menstrual cycle but are not migraines. Trazodone  is used for sleep, which is generally effective despite work-related stress. She plans to travel to [place]  and is concerned about her glucose sensor detaching, planning to bring an extra sensor. Needs updated model of Freestyle Libre.     Health Maintenance Due  Topic Date Due   Pneumococcal Vaccine (2 of 2 - PCV) 09/22/2014   Influenza Vaccine  12/16/2023   COVID-19 Vaccine (3 - 2025-26 season) 01/16/2024    Past Medical History:  Diagnosis Date   Allergy    Diabetes mellitus without  complication (HCC)    Hepatic adenoma    benign; related to birth control   History of chicken pox    Hyperlipidemia    Hypertension    Hypothyroid    Iron  deficiency anemia 06/20/2019    Past Surgical History:  Procedure Laterality Date   WISDOM TOOTH EXTRACTION  2005    Family History  Problem Relation Age of Onset   Hyperlipidemia Mother    Depression Mother    Hyperlipidemia Father    Hypertension Father    Hypothyroidism Maternal Grandmother    Heart disease Maternal Grandfather        MI, stent, CAD   Hypertension Maternal Grandfather    Hyperlipidemia Maternal Grandfather    COPD Paternal Grandmother    Hypertension Paternal Grandmother    Hyperlipidemia Paternal Grandmother    Heart disease Paternal Grandmother    AAA (abdominal aortic aneurysm) Paternal Grandfather    Dementia Paternal Grandfather    Heart disease Paternal Grandfather     Social History   Socioeconomic History   Marital status: Single    Spouse name: Not on file   Number of children: Not on file   Years of education: Not on file   Highest education level: Bachelor's degree (e.g., BA, AB, BS)  Occupational History   Not on file  Tobacco Use   Smoking status: Never   Smokeless tobacco: Never  Vaping Use   Vaping status: Never Used  Substance and Sexual Activity   Alcohol use: Yes    Alcohol/week: 0.0 standard drinks of  alcohol    Comment: occasional   Drug use: No   Sexual activity: Yes    Partners: Male    Birth control/protection: None    Comment: INTERCOURSE AGE 26, SEXUAL PARTNERS 5  Other Topics Concern   Not on file  Social History Narrative   Enjoys family friends, sleeping, reading, relaxing   2 cats Bruce and Bella   No children   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 [place]   Social Drivers of Corporate investment banker Strain: Low Risk  (01/18/2024)   Overall Financial Resource Strain (CARDIA)    Difficulty of Paying Living Expenses: Not hard at all  Food  Insecurity: No Food Insecurity (01/18/2024)   Hunger Vital Sign    Worried About Running Out of Food in the Last Year: Never true    Ran Out of Food in the Last Year: Never true  Transportation Needs: No Transportation Needs (01/18/2024)   PRAPARE - Administrator, Civil Service (Medical): No    Lack of Transportation (Non-Medical): No  Physical Activity: Insufficiently Active (01/18/2024)   Exercise Vital Sign    Days of Exercise per Week: 4 days    Minutes of Exercise per Session: 20 min  Stress: No Stress Concern Present (01/18/2024)   [employer] of Occupational Health - Occupational Stress Questionnaire    Feeling of Stress: Only a little  Social Connections: Moderately Integrated (01/18/2024)   Social Connection and Isolation Panel    Frequency of Communication with Friends and Family: More than three times a week    Frequency of Social Gatherings with Friends and Family: More than three times a week    Attends Religious Services: 1 to 4 times per year    Active Member of [employer] or Organizations: No    Attends Engineer, structural: Not on file    Marital Status: Living with partner  Intimate Partner Violence: Not on file    Outpatient Medications Prior to Visit  Medication Sig Dispense Refill   atorvastatin  (LIPITOR) 80 MG tablet Take 1 tablet (80 mg total) by mouth daily. 90 tablet 1   Ferrous Sulfate (IRON ) 325 (65 Fe) MG TABS Take 1 tablet (325 mg total) by mouth daily. 1 tablet by mouth twice daily 30 tablet 0   glucose blood (FREESTYLE TEST STRIPS) test strip Use as instructed (Patient taking differently: Use as instructed to check blood sugar 1 to 2 times daily.) 100 each 12   hydrochlorothiazide  (HYDRODIURIL ) 25 MG tablet Take 1 tablet (25 mg total) by mouth daily. 90 tablet 0   levothyroxine  (SYNTHROID ) 50 MCG tablet Take 1 tablet (50 mcg total) by mouth daily before breakfast. 90 tablet 1   metFORMIN  (GLUCOPHAGE ) 500 MG tablet Take 1 tablet (500 mg  total) by mouth 2 (two) times daily with a meal. 180 tablet 1   metoprolol  succinate (TOPROL -XL) 25 MG 24 hr tablet Take 1 tablet (25 mg total) by mouth daily. 90 tablet 1   Omega-3 Fatty Acids (FISH OIL ) 1000 MG CAPS Take 3 capsules (3,000 mg total) by mouth in the morning and at bedtime.  0   omeprazole  (PRILOSEC) 20 MG capsule Take 1 capsule (20 mg total) by mouth daily. 90 capsule 1   spironolactone  (ALDACTONE ) 50 MG tablet Take 1 tablet (50 mg total) by mouth daily. 90 tablet 1   traZODone  (DESYREL ) 50 MG tablet Take 1 tablet (50 mg total) by mouth at bedtime. 90 tablet  1   Continuous Glucose Sensor (FREESTYLE LIBRE 3 SENSOR) MISC Change every 14 days 6 each 5   tirzepatide  (MOUNJARO ) 7.5 MG/0.5ML Pen Inject 7.5 mg into the skin once a week. 2 mL 0   metFORMIN  (GLUCOPHAGE ) 1000 MG tablet Take 500 mg by mouth. (Patient not taking: Reported on 01/20/2024)     No facility-administered medications prior to visit.    Allergies  Allergen Reactions   Amlodipine      flushing   Codeine     Allscripts Description: Codeine DerivativesAllscripts Description: Codeine Sulfate TABSAllscripts Description: Codeine Phosphate Powder Allscripts Description: Codeine Phosphate Powder   Other     CINNAMON GUM--blisters.    ROS See HPI    Objective:    Physical Exam Constitutional:      General: She is not in acute distress.    Appearance: Normal appearance. She is well-developed.  HENT:     Head: Normocephalic and atraumatic.     Right Ear: External ear normal.     Left Ear: External ear normal.  Eyes:     General: No scleral icterus. Neck:     Thyroid : No thyromegaly.  Cardiovascular:     Rate and Rhythm: Normal rate and regular rhythm.     Heart sounds: Normal heart sounds. No murmur heard. Pulmonary:     Effort: Pulmonary effort is normal. No respiratory distress.     Breath sounds: Normal breath sounds. No wheezing.  Musculoskeletal:     Cervical back: Neck supple.  Skin:     General: Skin is warm and dry.  Neurological:     Mental Status: She is alert and oriented to person, place, and time.  Psychiatric:        Mood and Affect: Mood normal.        Behavior: Behavior normal.        Thought Content: Thought content normal.        Judgment: Judgment normal.    Diabetic Foot Exam - Simple   Simple Foot Form Diabetic Foot exam was performed with the following findings: Yes 01/20/2024  4:05 PM  Visual Inspection No deformities, no ulcerations, no other skin breakdown bilaterally: Yes Sensation Testing Intact to touch and monofilament testing bilaterally: Yes Pulse Check Posterior Tibialis and Dorsalis pulse intact bilaterally: Yes Comments       BP 122/73   Pulse 86   Temp 98.6 F (37 C) (Oral)   Resp 16   Ht 5' 7 (1.702 m)   Wt 266 lb 8 oz (120.9 kg)   LMP 12/26/2023   SpO2 98%   BMI 41.74 kg/m  Wt Readings from Last 3 Encounters:  01/20/24 266 lb 8 oz (120.9 kg)  12/08/23 264 lb (119.7 kg)  09/16/23 281 lb (127.5 kg)       Assessment & Plan:   Problem List Items Addressed This Visit       Unprioritized   Morbid obesity (HCC)   Wt Readings from Last 3 Encounters:  01/20/24 266 lb 8 oz (120.9 kg)  12/08/23 264 lb (119.7 kg)  09/16/23 281 lb (127.5 kg)   Continues to work on weight loss.  Increase mounjaro  from 7.5mg  to 10mg .       Relevant Medications   tirzepatide  (MOUNJARO ) 10 MG/0.5ML Pen   Migraine headache   No recent migraines.       Iron  deficiency anemia   Lab Results  Component Value Date   WBC 8.6 06/15/2022   HGB 13.5 06/15/2022   HCT 39.9  06/15/2022   MCV 86.4 06/15/2022   PLT 294.0 06/15/2022   Continues daily oral iron  supplement.       Insomnia   Overall stable on trazodone .       Hypothyroidism   Clinically stable on synthroid . Continue same.  Lab Results  Component Value Date   TSH 1.41 09/16/2023         Hyperlipidemia   Lab Results  Component Value Date   CHOL 196 06/15/2022   HDL  51.70 06/15/2022   LDLCALC 107 (H) 06/15/2022   LDLDIRECT 105.0 02/03/2021   TRIG 186.0 (H) 06/15/2022   CHOLHDL 4 06/15/2022         Relevant Orders   Lipid panel   HTN (hypertension)   BP Readings from Last 3 Encounters:  01/20/24 122/73  12/08/23 128/80  09/16/23 129/79   Stable on hydrochlorothiazide .  Continue same.       GERD (gastroesophageal reflux disease)   Stable on daily omeprazole .  Continue same.       Diabetes type 2, controlled (HCC) - Primary   Lab Results  Component Value Date   HGBA1C 7.5 (H) 09/16/2023   HGBA1C 7.0 (H) 01/24/2023   HGBA1C 6.3 06/15/2022   Lab Results  Component Value Date   MICROALBUR <0.2 09/16/2023   LDLCALC 107 (H) 06/15/2022   CREATININE 0.63 09/16/2023   No further hypoglycemia off of glipizide  and reduced dose of metformin . Would like to increase mounjaro  dose for weight loss purposes since weight has stabilized. Increase to 10mg  weekly.       Relevant Medications   tirzepatide  (MOUNJARO ) 10 MG/0.5ML Pen   Continuous Glucose Sensor (FREESTYLE LIBRE 3 PLUS SENSOR) MISC   Other Relevant Orders   Basic Metabolic Panel (BMET)   HgB A1c    I have discontinued Kahdijah B. Ahrendt's FreeStyle Libre 3 Sensor and Mounjaro . I am also having her start on tirzepatide  and FreeStyle Libre 3 Plus Sensor. Additionally, I am having her maintain her glucose blood, Fish Oil , Iron , spironolactone , traZODone , omeprazole , metoprolol  succinate, levothyroxine , atorvastatin , metFORMIN , and hydrochlorothiazide .  Meds ordered this encounter  Medications   tirzepatide  (MOUNJARO ) 10 MG/0.5ML Pen    Sig: Inject 10 mg into the skin once a week.    Dispense:  6 mL    Refill:  0    Supervising Provider:   DOMENICA BLACKBIRD A [[id number]]   Continuous Glucose Sensor (FREESTYLE LIBRE 3 PLUS SENSOR) MISC    Sig: Change sensor every 15 days.    Dispense:  6 each    Refill:  5    Supervising Provider:   DOMENICA BLACKBIRD A [[id number]]

## 2024-01-20 NOTE — Assessment & Plan Note (Signed)
 BP Readings from Last 3 Encounters:  01/20/24 122/73  12/08/23 128/80  09/16/23 129/79   Stable on hydrochlorothiazide .  Continue same.

## 2024-01-20 NOTE — Assessment & Plan Note (Signed)
 Lab Results  Component Value Date   HGBA1C 7.5 (H) 09/16/2023   HGBA1C 7.0 (H) 01/24/2023   HGBA1C 6.3 06/15/2022   Lab Results  Component Value Date   MICROALBUR <0.2 09/16/2023   LDLCALC 107 (H) 06/15/2022   CREATININE 0.63 09/16/2023   No further hypoglycemia off of glipizide  and reduced dose of metformin . Would like to increase mounjaro  dose for weight loss purposes since weight has stabilized. Increase to 10mg  weekly.

## 2024-01-20 NOTE — Assessment & Plan Note (Signed)
 Clinically stable on synthroid . Continue same.  Lab Results  Component Value Date   TSH 1.41 09/16/2023

## 2024-01-20 NOTE — Assessment & Plan Note (Addendum)
 Lab Results  Component Value Date   WBC 8.6 06/15/2022   HGB 13.5 06/15/2022   HCT 39.9 06/15/2022   MCV 86.4 06/15/2022   PLT 294.0 06/15/2022   Continues daily oral iron  supplement.

## 2024-01-20 NOTE — Assessment & Plan Note (Signed)
 Lab Results  Component Value Date   CHOL 196 06/15/2022   HDL 51.70 06/15/2022   LDLCALC 107 (H) 06/15/2022   LDLDIRECT 105.0 02/03/2021   TRIG 186.0 (H) 06/15/2022   CHOLHDL 4 06/15/2022

## 2024-01-20 NOTE — Assessment & Plan Note (Signed)
 Stable on daily omeprazole .  Continue same.

## 2024-01-20 NOTE — Assessment & Plan Note (Addendum)
 Wt Readings from Last 3 Encounters:  01/20/24 266 lb 8 oz (120.9 kg)  12/08/23 264 lb (119.7 kg)  09/16/23 281 lb (127.5 kg)   Continues to work on weight loss.  Increase mounjaro  from 7.5mg  to 10mg .

## 2024-01-20 NOTE — Patient Instructions (Signed)
 VISIT SUMMARY:  Today, we discussed your diabetes management, weight loss, and medication regimen. We also reviewed your other health conditions, including hypertension, hypothyroidism, reflux, acne, and insomnia.  YOUR PLAN:  TYPE 2 DIABETES MELLITUS: Your blood glucose levels have stabilized after stopping glipizide . You are currently on metformin  and Mounjaro . -Increase Mounjaro  to 10 mg after you finish your current supply of 7.5 mg. -We will order A1c and BMP tests to monitor your kidney function and diabetes control. -We will perform a foot exam. -We will order a lipid panel to check your cholesterol levels.  OBESITY: You have lost weight recently and we are considering increasing Mounjaro  to help with further weight loss. -Increase Mounjaro  to 10 mg after you finish your current supply of 7.5 mg.  ESSENTIAL HYPERTENSION: Your blood pressure is well-controlled with your current medications. -Continue your current medication regimen.  HYPOTHYROIDISM: Your hypothyroidism is managed with Synthroid  and your TSH levels were normal in May. -Continue your current Synthroid  regimen.  GASTROESOPHAGEAL REFLUX DISEASE: Your reflux is managed with daily omeprazole , but symptoms return if you miss a dose. -Continue taking omeprazole  daily.  ACNE: Your acne is managed with Aldactone , and stopping it causes flare-ups. -Continue taking Aldactone  for acne management.  INSOMNIA: Your insomnia is managed with trazodone , which is effective despite work-related stress. -Continue taking trazodone  for insomnia.  MIGRAINE: You have headaches around your menstrual cycle, but they are not full-blown migraines. -Monitor your headaches and let us  know if they worsen.  GENERAL HEALTH MAINTENANCE: We discussed your vaccinations and screenings. You have completed the Hepatitis B and Gardasil HPV vaccine series. -You are a candidate for the Prevnar 20 vaccination due to your diabetes diagnosis. We can offer  this to you. -Get your flu shot at work.

## 2024-01-20 NOTE — Assessment & Plan Note (Signed)
 Overall stable on trazodone .

## 2024-01-21 LAB — BASIC METABOLIC PANEL WITH GFR
BUN: 10 mg/dL (ref 7–25)
CO2: 24 mmol/L (ref 20–32)
Calcium: 10 mg/dL (ref 8.6–10.2)
Chloride: 102 mmol/L (ref 98–110)
Creat: 0.6 mg/dL (ref 0.50–0.97)
Glucose, Bld: 164 mg/dL — ABNORMAL HIGH (ref 65–99)
Potassium: 3.3 mmol/L — ABNORMAL LOW (ref 3.5–5.3)
Sodium: 136 mmol/L (ref 135–146)
eGFR: 118 mL/min/1.73m2 (ref >=60)

## 2024-01-21 LAB — LIPID PANEL
Cholesterol: 160 mg/dL (ref <200)
HDL: 45 mg/dL — ABNORMAL LOW (ref >=50)
LDL Cholesterol (Calc): 86 mg/dL
Non-HDL Cholesterol (Calc): 115 mg/dL (ref <130)
Total CHOL/HDL Ratio: 3.6 (calc) (ref <5.0)
Triglycerides: 193 mg/dL — ABNORMAL HIGH (ref <150)

## 2024-01-21 LAB — HEMOGLOBIN A1C
Hgb A1c MFr Bld: 6 % — ABNORMAL HIGH (ref <5.7)
Mean Plasma Glucose: 126 mg/dL
eAG (mmol/L): 7 mmol/L

## 2024-01-23 ENCOUNTER — Other Ambulatory Visit (HOSPITAL_BASED_OUTPATIENT_CLINIC_OR_DEPARTMENT_OTHER): Payer: Self-pay

## 2024-01-23 ENCOUNTER — Other Ambulatory Visit: Payer: Self-pay | Admitting: Family

## 2024-01-23 DIAGNOSIS — E876 Hypokalemia: Secondary | ICD-10-CM

## 2024-01-23 MED ORDER — POTASSIUM CHLORIDE CRYS ER 10 MEQ PO TBCR
10.0000 meq | EXTENDED_RELEASE_TABLET | Freq: Every day | ORAL | 1 refills | Status: AC
  Filled 2024-01-23: qty 90, 90d supply, fill #0
  Filled 2024-04-15: qty 90, 90d supply, fill #1

## 2024-01-23 NOTE — Telephone Encounter (Signed)
 Potassium looks low, likely due to hydrochlorothiazide . I would like her to add kdur once daily and repeat bmet in 1 week.   A1C is much improved at 6.0- keep up the good work.  Triglycerides still a bit elevated, continue to work on diet/exercise and weight loss.

## 2024-01-23 NOTE — Telephone Encounter (Signed)
 Called patient but no answer, left voice mail for patient to call back.

## 2024-01-23 NOTE — Telephone Encounter (Signed)
 Pt called back and labs reviewed, lab appt made

## 2024-02-02 ENCOUNTER — Other Ambulatory Visit (INDEPENDENT_AMBULATORY_CARE_PROVIDER_SITE_OTHER)

## 2024-02-02 DIAGNOSIS — E876 Hypokalemia: Secondary | ICD-10-CM | POA: Diagnosis not present

## 2024-02-03 ENCOUNTER — Ambulatory Visit: Payer: Self-pay | Admitting: Family

## 2024-02-03 LAB — BASIC METABOLIC PANEL WITH GFR
BUN: 13 mg/dL (ref 6–23)
CO2: 24 meq/L (ref 19–32)
Calcium: 10.1 mg/dL (ref 8.4–10.5)
Chloride: 100 meq/L (ref 96–112)
Creatinine, Ser: 0.63 mg/dL (ref 0.40–1.20)
GFR: 112.76 mL/min (ref >60.00)
Glucose, Bld: 102 mg/dL — ABNORMAL HIGH (ref 70–99)
Potassium: 3.7 meq/L (ref 3.5–5.1)
Sodium: 137 meq/L (ref 135–145)

## 2024-02-12 ENCOUNTER — Other Ambulatory Visit (HOSPITAL_BASED_OUTPATIENT_CLINIC_OR_DEPARTMENT_OTHER): Payer: Self-pay

## 2024-02-13 ENCOUNTER — Other Ambulatory Visit (HOSPITAL_BASED_OUTPATIENT_CLINIC_OR_DEPARTMENT_OTHER): Payer: Self-pay

## 2024-02-14 ENCOUNTER — Encounter: Payer: Self-pay | Admitting: Family

## 2024-02-14 DIAGNOSIS — D134 Benign neoplasm of liver: Secondary | ICD-10-CM

## 2024-02-14 DIAGNOSIS — K769 Liver disease, unspecified: Secondary | ICD-10-CM

## 2024-02-15 NOTE — Addendum Note (Signed)
 Addended by: DARYL SETTER on: 02/15/2024 12:27 PM   Modules accepted: Orders

## 2024-03-11 ENCOUNTER — Ambulatory Visit (HOSPITAL_BASED_OUTPATIENT_CLINIC_OR_DEPARTMENT_OTHER)

## 2024-03-11 ENCOUNTER — Ambulatory Visit (HOSPITAL_BASED_OUTPATIENT_CLINIC_OR_DEPARTMENT_OTHER)
Admission: RE | Admit: 2024-03-11 | Discharge: 2024-03-11 | Disposition: A | Discharge status: Home / Self Care | Source: Ambulatory Visit | Attending: Family | Admitting: Family

## 2024-03-11 DIAGNOSIS — D134 Benign neoplasm of liver: Secondary | ICD-10-CM

## 2024-03-22 DIAGNOSIS — H52223 Regular astigmatism, bilateral: Secondary | ICD-10-CM | POA: Diagnosis not present

## 2024-03-22 DIAGNOSIS — Z135 Encounter for screening for eye and ear disorders: Secondary | ICD-10-CM | POA: Diagnosis not present

## 2024-03-22 DIAGNOSIS — E119 Type 2 diabetes mellitus without complications: Secondary | ICD-10-CM | POA: Diagnosis not present

## 2024-03-22 LAB — HM DIABETES EYE EXAM

## 2024-03-23 ENCOUNTER — Encounter: Payer: Self-pay | Admitting: Family

## 2024-03-25 ENCOUNTER — Ambulatory Visit (HOSPITAL_BASED_OUTPATIENT_CLINIC_OR_DEPARTMENT_OTHER)
Admission: RE | Admit: 2024-03-25 | Discharge: 2024-03-25 | Disposition: A | Discharge status: Home / Self Care | Source: Ambulatory Visit | Attending: Family | Admitting: Family

## 2024-03-25 DIAGNOSIS — D134 Benign neoplasm of liver: Secondary | ICD-10-CM | POA: Insufficient documentation

## 2024-03-25 DIAGNOSIS — K769 Liver disease, unspecified: Secondary | ICD-10-CM | POA: Diagnosis not present

## 2024-03-25 DIAGNOSIS — R160 Hepatomegaly, not elsewhere classified: Secondary | ICD-10-CM | POA: Diagnosis not present

## 2024-03-25 MED ORDER — GADOBUTROL 1 MMOL/ML IV SOLN
10.0000 mL | Freq: Once | INTRAVENOUS | Status: DC | PRN
  Administered 2024-03-25: 10 mL via INTRAVENOUS

## 2024-03-26 ENCOUNTER — Ambulatory Visit: Payer: Self-pay | Admitting: Family

## 2024-03-27 NOTE — Telephone Encounter (Signed)
 Please fax MRI results to Stephane Quest NP at [facility].

## 2024-03-27 NOTE — Telephone Encounter (Signed)
 MRI faxed

## 2024-03-29 DIAGNOSIS — K760 Fatty (change of) liver, not elsewhere classified: Secondary | ICD-10-CM | POA: Diagnosis not present

## 2024-03-29 DIAGNOSIS — D134 Benign neoplasm of liver: Secondary | ICD-10-CM | POA: Diagnosis not present

## 2024-03-29 MED ORDER — GADOXETATE DISODIUM 0.25 MMOL/ML IV SOLN
10.0000 mL | Freq: Once | INTRAVENOUS | Status: AC | PRN
  Administered 2024-03-25: 10 mL via INTRAVENOUS

## 2024-03-29 NOTE — Progress Notes (Signed)
 [facility] [place]  Patient: Kristy Chung Age: 38 y.o.        DOB: 1985/07/05  MRN: [id number] Sex: female      DATE: 03/29/24    Referring Provider:  Norleen Kiang, MD  Primary Care Provider:  Eleanor Ponto, NP [address]   [address] [place], [place] [address]   [phone] (Work)   [phone] (Fax)    GYN: Prentiss Annabella LABOR, NP   [facility]   [address]   [address]   [place], [place] [address]   [phone] (Work)   [phone] (Fax)  Subjective   Kristy Chung is a 38 y.o. White or Caucasian female who is alone and serves as her own historian.  Kristy Chung was last seen by me at [facility] on 03/24/2023 and is here today for follow up.   Patient has a history of hepatic adenomas and fatty infiltration of the liver.  She denies any hospital or emergency room visits and denies any symptoms attributable to liver disease.      She underwent repeat MRI in November 2025 that showed stable hepatic adenomas and diffuse fatty infiltration of the liver.  There is no evidence of cirrhosis or lesions concerning for malignancy.    Labs continue to show mild elevation of aminotransferases.      She is currently on mounjaro  for diabetes.      She denies any hospital or emergency room visits.      Patient Active Problem List  Diagnosis  . Diabetes type 2, controlled  . Hepatic adenoma  . HTN (hypertension)  . Hyperlipidemia  . Hypothyroidism  . PCOS (polycystic ovarian syndrome)  . Metabolic dysfunction-associated steatotic liver disease (MASLD)    No past surgical history on file.  Family History  Problem Relation Name Age of Onset  . Diabetes Paternal Grandmother    . Emphysema Paternal Grandmother    . Heart disease Paternal Grandmother    . Hyperlipidemia Paternal Grandmother    . Hyperlipidemia Father    . Hypertension Father    . Hypertension  Paternal Grandmother    . Osteoporosis Maternal Grandmother    . Thrombophlebitis Paternal Grandmother    . Thyroid  disease Maternal Grandmother      Allergies  Allergen Reactions  . Amlodipine      flushing flushing   . Codeine     Allscripts Description: Codeine DerivativesAllscripts Description: Codeine Sulfate TABSAllscripts Description: Codeine Phosphate Powder Allscripts Description: Codeine Phosphate Powder     Current Outpatient Medications  Medication Instructions  . atorvastatin  (LIPITOR) 80 mg, At bedtime  . ezetimibe -simvastatin (Vytorin 10-40) 10-40 mg tab 1 tablet, Daily  . glipiZIDE  (GLUCOTROL  XL) 2.5 mg, oral, Daily  . hydroCHLOROthiazide  (HYDRODIURIL ) 25 mg, Daily  . levothyroxine  (SYNTHROID ) 50 mcg, Every morning  . metFORMIN  (GLUCOPHAGE ) 1,000 mg, oral, 2 times daily with meals  . metFORMIN  (GLUCOPHAGE ) 500 mg, 2 times daily with meals  . metoprolol  succinate (TOPROL  XL) 25 mg, Daily  . Mounjaro  7.5 mg, Every 7 days  . omega-3 fatty acids-fish oil  (FISH OIL ) 340-1,000 mg cap capsule 3,000 mg, Daily  . omeprazole  (PRILOSEC) 20 mg, Daily at 0600  . potassium chloride  (KLOR-CON ) 10 mEq ER tablet 10 mEq, Daily  . spironolactone  (ALDACTONE ) 50 mg, Daily  . traZODone  (DESYREL ) 50 mg, At bedtime    Objective:    Vitals:   03/29/24 1110  BP: 114/84  Pulse: 79  Temp: 97.7 F (36.5 C)  SpO2: 99%   Height: 1.702 m (5' 7) (03/29/2024 11:10 AM)  Weight: 118 kg (259 lb 11.2 oz) (03/29/2024 11:10 AM)  Body mass index is 40.67 kg/m.  Physical Exam Constitutional:      Appearance: She is obese.  Eyes:     General: No scleral icterus.    Extraocular Movements: Extraocular movements intact.     Pupils: Pupils are equal, round, and reactive to light.  Cardiovascular:     Rate and Rhythm: Normal rate and regular rhythm.     Heart sounds: No murmur heard. Pulmonary:     Effort: Pulmonary effort is normal.     Breath sounds: Normal breath sounds.  Abdominal:      General: There is no distension.     Palpations: Abdomen is soft. There is no shifting dullness, fluid wave, hepatomegaly, splenomegaly or mass.     Tenderness: There is no abdominal tenderness.     Hernia: No hernia is present.  Musculoskeletal:        General: Normal range of motion.  Lymphadenopathy:     Cervical: No cervical adenopathy.  Skin:    General: Skin is warm and dry.     Coloration: Skin is not jaundiced.  Neurological:     General: No focal deficit present.     Mental Status: She is alert and oriented to person, place, and time.     Motor: No tremor.     Gait: Gait is intact.  Psychiatric:        Mood and Affect: Mood normal.        Behavior: Behavior normal.        Thought Content: Thought content normal.      FIBROSIS STAGING: 05/2022 Fibrosis-4 (FIB-4) Index for Liver Fibrosis  RESULT SUMMARY: 0.52 points Advanced fibrosis excluded Approximate fibrosis stage: Ishak 0-1 (Sterling et al 2006) INPUTS: Age --> 37 years AST --> 32 U/L ALT --> 59 U/L Platelet count --> 294  10/L   LABS:      IMAGING: 03/25/2024 MRI ABDOMEN WITHOUT AND WITH CONTRAST   TECHNIQUE:  Multiplanar multisequence MR imaging of the abdomen was performed  both before and after the administration of intravenous contrast.   CONTRAST:  10mL GADAVIST GADOBUTROL 1 MMOL/ML IV SOLN   COMPARISON:  MRI abdomen from 03/08/2023.   FINDINGS:  The technologist noted patient had trouble holding breath.   Lower chest: Unremarkable MR appearance to the lung bases. No  pleural effusion. No pericardial effusion. Normal heart size.   Hepatobiliary: The liver is mildly enlarged in size measuring up to  16.1 cm in length. Noncirrhotic configuration. Please note this exam  is moderate to markedly degraded due to patient's motion during data  acquisition. The arterial hyperenhancing lesions seen on the prior  exam are not seen on the arterial phase images today; however, there  are 2  relatively hypoenhancing lesions (series 10, image 16 and 21)  in comparison to the background parenchyma, seen on the  delayed/equilibrium phase images, which appears essentially similar  to the prior study. These lesions were better characterized on the  prior exam. No other previously seen lesions are seen within the  limitations of today's exam. No new liver lesion seen.   No intrahepatic or extrahepatic bile duct dilatation. No  choledocholithiasis. Unremarkable gallbladder.   Pancreas: No mass, inflammatory changes or other parenchymal  abnormality identified. No main pancreatic duct dilation.   Spleen:  Within normal limits in size and appearance. No focal mass.   Adrenals/Urinary Tract: Unremarkable adrenal glands. No  hydroureteronephrosis. No suspicious renal mass.   Stomach/Bowel: Visualized portions within the abdomen are  unremarkable. No disproportionate dilation of bowel loops.   Vascular/Lymphatic: No pathologically enlarged lymph nodes  identified. No abdominal aortic aneurysm demonstrated. No ascites.   Other:  None.   Musculoskeletal: No suspicious bone lesions identified.   IMPRESSION:  1. The exam is moderately degraded by patient's motion. However,  there are 2 relatively hypoenhancing lesions seen on the  delayed/equilibrium phase images, which are essentially similar to  the prior study. No new liver lesion seen.  2. Other previously seen arterial hyperenhancing lesions are not  seen on the current exam.  3. Multiple other nonacute observations, as described above.      Assessment / Plan:   Problem List Items Addressed This Visit     Hepatic adenoma - Primary   Overview  Patient has stable hepatic adenomas less than 3 cm in size.  Will plan for repeat MRI in one year to monitor stability.        Patient is aware to continue to not take hormonal contraceptives and that if she becomes pregnant she will need to notify our office so that we can  monitor her adenoma throughout pregnancy.        Metabolic dysfunction-associated steatotic liver disease (MASLD)   Overview  Patient has metabolic dysfunction associated steatotic liver disease (MASLD)    Steatotic liver disease risk factors include:  obesity (BMI >25), type 2 diabetes, hypertension (blood pressure >130/85 or on antihypertensive drugs), hyperlipidemia (HDL <40 mg/dL for female or <49 mg/dL for female OR on lipid-lowering agent),  Patient with evidence of hepatic steatosis on imaging.  Steatotic liver disease is a diagnosis of exclusion; serologic evaluation for other etiologies of liver disease was unremarkable  Most recent fibrosis staging with F0-F1 by FIB-4 with a score of 0.59  Recently updated guidance from the AGA recommends that patients with steatosis on imaging and/or elevated aminotransferases undergo noninvasive testing for fibrosis.  Patient's whose fib-4 is <1.3 or likely low risk for rapid progression of fibrosis and repeat noninvasive testing should be done at 2-3 years unless clinical circumstances change.  I reviewed with the patient the natural history and progression of steatotic liver disease (SLD) including risk for progressive liver fibrosis that can potentially lead to cirrhosis.  We discussed that the treatment is primarily through lifestyle interventions. These interventions include weight loss in overweight or obese patients, encouraging a Mediterranean diet, and increasing physical activity to at least 150-300 minutes of moderate-intensity exercise per week..  Patient should avoid all alcohol, follow a low-fat/cholesterol/carbohydrate diet, restrict calories, increase exercise, heavy goal of at least a 10% reduction in body weight, maintain good control of blood pressure and blood sugar.    It is important to manage cardiovascular and metabolic risk factors, such as hypertension, dyslipidemia, and type 2 diabetes, in patients with SLD.   Statins  are considered safe in patients with MASLD and MASH, including those with mild to moderate elevations in aminotransferases, provided there is no evidence of decompensated liver disease. Routine monitoring of liver enzymes is recommended at baseline and as clinically indicated. Statins should not be withheld solely due to the presence of MASLD or MASH, as cardiovascular events remain the leading cause of death in this population.   Structured weight-loss programs, obesity pharmacotherapy, and bariatric surgery can be effective in managing MASLD. Diabetes  medications such as some GLP-1 RAs may improve liver histology and cardiometabolic health and should be considered in appropriate patients.    Patient is currently being treated with Mounjaro  (tirzepatide ), ; would recommend that patient's dose be escalated to 15 mg weekly if tolerated as this was the dose used in clinical trials.  Patient is not a candidate for treatment with an approved regimen as less than F2-F3.    Summary of GLP-1s trials:   Semagluitde (phase 3): 2.4 mg vs placebo weekly at 72 weeks -- 63%  of patients in semaglutide had resolution of MASH compared to 34% on placebo; 37% had improvement by at least one stage of fibrosis compared to 22% on placebo* Tirzepatide  (phase 2): Four arms -5 mg, 10 mg, 15 mg, vs placeboat 52 weeks -- 44% - 62% of patients in tirzepatide  had resolution of MASH compared to 10% on placebo; 51%-55% had improvement by at least one stage of fibrosis compared to 30% on placebo* Liraglutide (phase 2): 1.8 mg daily vs placebo for 48 weeks, 39% of patients achieved resolution of steatohepatitis compared to 9% with placebo (P = 0.019), and progression of fibrosis was less frequent (9% vs 36%, P = 0.04).  Dulaglutide: has not been studied in RCTs with liver histological outcomes in MASH, so its efficacy for this indication is unknown.   Publications regarding GLP-1 in MASLD/MASH: Semaglutide: ESSENCE is a phase 3  trial evaluating the effect of once-weekly subcutaneous semaglutide 2.4 mg in adults with MASH and F2-F3 fibrosis. ESSENCE is a two-part trial with participants randomized 2:1 to receive semaglutide 2.4 mg or placebo, on top of standard of care for 240 weeks. In part 1, the objective was to demonstrate that treatment with semaglutide improves liver histology at 72 weeks based on biopsy sampling; in part 2, the objective is to demonstrate that treatment with semaglutide lowers the risk of liver-related clinical events compared to placebo at 240 weeks. Results from part 1 of the ongoing ESSENCE trial achieved its primary endpoints by demonstrating a statistically significant and superior improvement in liver fibrosis with no worsening of steatohepatitis, as well as resolution of steatohepatitis with no worsening of liver fibrosis with semaglutide 2.4 mg compared to placebo. At week 72, 37.0% of people treated with semaglutide 2.4 mg achieved improvement in liver fibrosis with no worsening of steatohepatitis compared to 22.5% on placebo2. 62.9% of people treated with semaglutide 2.4 mg achieved resolution of steatohepatitis with no worsening of liver fibrosis compared to 34.1% on placebo. Sanyal, A. J., Newsome, P. N., Kliers, I., stergaard, FREDRIK DEL., Long, M. T., Kjr, M. S., Cali, RONAL EMERSON MATSU., Bugianesi, E., Rinella, M. E., Roden, M., Ratziu, V., & ESSENCE Study Group (2025). Phase 3 Trial of Semaglutide in Metabolic Dysfunction-Associated Steatohepatitis. The [place] journal of medicine, [phone]), 2089-2099. [email or web address]  Tirzepatide : Data was published in July 2024 of a Phase 2 multicenter, dose-finding, double-blind, placebo-controlled, randomized controlled trial (RCT) evaluating if tirzepatide  will decrease fibrosis in and resolve metabolic-dysfunction associated steatohepatitis (MASH).   Primary endpoint was resolution of MASH without worsening of fibrosis at week 52. Outcomes were  assessed by central, independent review by 2 pathologists.  Participants randomized 1:1:1:1 to 1 of 4 study arms: 1) tirzepatide  5 mg once weekly; 2) tirzepatide  10 mg once weekly; 3) tirzepatide  15 mg once weekly; or 4) placebo once weekly, all administered subcutaneously for 52 weeks. Tirzepatide  starting dose was 2.5 mg once weekly and increased by 2.5 mg every 4 weeks until target dose attained (  based on assigned study arm).  At week 52, MASH resolution without worsening fibrosis was achieved in 44% of participants in the tirzepatide  5 mg arm, 56% in the tirzepatide  10 mg arm and 62% in the tirzepatide  15 mg arm compared to 10% in the placebo arm. The percentage of participants who had an improvement of at least one fibrosis stage without worsening of MASH was 30% in the placebo group, 55% in the 5-mg tirzepatide  group (difference vs. placebo, 25 percentage points; 95% CI, 5 to 46), 51% in the 10-mg tirzepatide  group (difference, 22 percentage points; 95% CI, 1 to 42), and 51% in the 15-mg tirzepatide  group (difference, 21 percentage points; 95% CI, 1 to 42). The most common adverse events in the tirzepatide  groups were gastrointestinal events, and most were mild or moderate in severity. Keary JONELLE Pike ML, Lawitz EJ. et al. Tirzepatide  for metabolic-dysfunction associated steatohepatitis with liver fibrosis. NEJM 2024 Jun 8. Online ahead of print.      Relevant Orders   Liver Function Panel   Enhanced Liver Fibrosis (ELF)T Test   Platelet Count     PLAN: Labs Follow up in 1 year(s)  Stephane Quest, MSN, NP, A-GPCNP-BC, AF_AASLD Nurse Practitioner  [facility] [place] Office: [phone]  Fax: [phone]   [facility]  Mailing: [address], [address], [place], [place] [address]

## 2024-04-01 ENCOUNTER — Other Ambulatory Visit: Payer: Self-pay | Admitting: Family

## 2024-04-01 DIAGNOSIS — E039 Hypothyroidism, unspecified: Secondary | ICD-10-CM

## 2024-04-01 DIAGNOSIS — L680 Hirsutism: Secondary | ICD-10-CM

## 2024-04-01 MED ORDER — LEVOTHYROXINE SODIUM 50 MCG PO TABS
50.0000 ug | ORAL_TABLET | Freq: Every day | ORAL | 1 refills | Status: AC
  Filled 2024-04-01: qty 90, 90d supply, fill #0

## 2024-04-01 MED ORDER — SPIRONOLACTONE 50 MG PO TABS
50.0000 mg | ORAL_TABLET | Freq: Every day | ORAL | 1 refills | Status: AC
  Filled 2024-04-01: qty 90, 90d supply, fill #0

## 2024-04-01 MED ORDER — HYDROCHLOROTHIAZIDE 25 MG PO TABS
25.0000 mg | ORAL_TABLET | Freq: Every day | ORAL | 0 refills | Status: AC
  Filled 2024-04-01: qty 90, 90d supply, fill #0

## 2024-04-02 ENCOUNTER — Other Ambulatory Visit (HOSPITAL_BASED_OUTPATIENT_CLINIC_OR_DEPARTMENT_OTHER): Payer: Self-pay

## 2024-04-02 ENCOUNTER — Other Ambulatory Visit: Payer: Self-pay

## 2024-04-03 ENCOUNTER — Telehealth: Payer: Self-pay | Admitting: Family

## 2024-04-03 NOTE — Telephone Encounter (Signed)
 Spoke with pt re: MRI results and possibility of repeating MRI. She is going to double check with her Hepatologist, but sounds like Hepatologist was satisfied with what was available on the initial scan so she does not want to repeat it. She will let me know it this plan changes.

## 2024-04-15 ENCOUNTER — Other Ambulatory Visit: Payer: Self-pay | Admitting: Family

## 2024-04-15 DIAGNOSIS — G4700 Insomnia, unspecified: Secondary | ICD-10-CM

## 2024-04-15 DIAGNOSIS — K219 Gastro-esophageal reflux disease without esophagitis: Secondary | ICD-10-CM

## 2024-04-16 ENCOUNTER — Other Ambulatory Visit: Payer: Self-pay

## 2024-04-16 ENCOUNTER — Other Ambulatory Visit (HOSPITAL_BASED_OUTPATIENT_CLINIC_OR_DEPARTMENT_OTHER): Payer: Self-pay

## 2024-04-16 MED ORDER — OMEPRAZOLE 20 MG PO CPDR
20.0000 mg | DELAYED_RELEASE_CAPSULE | Freq: Every day | ORAL | 1 refills | Status: AC
  Filled 2024-04-16: qty 90, 90d supply, fill #0

## 2024-04-16 MED ORDER — TRAZODONE HCL 50 MG PO TABS
50.0000 mg | ORAL_TABLET | Freq: Every day | ORAL | 1 refills | Status: AC
  Filled 2024-04-16: qty 90, 90d supply, fill #0

## 2024-04-17 ENCOUNTER — Other Ambulatory Visit (HOSPITAL_BASED_OUTPATIENT_CLINIC_OR_DEPARTMENT_OTHER): Payer: Self-pay

## 2024-04-17 ENCOUNTER — Other Ambulatory Visit: Payer: Self-pay | Admitting: Family

## 2024-04-17 ENCOUNTER — Other Ambulatory Visit: Payer: Self-pay

## 2024-04-17 MED ORDER — METFORMIN HCL 500 MG PO TABS
500.0000 mg | ORAL_TABLET | Freq: Two times a day (BID) | ORAL | 0 refills | Status: AC
  Filled 2024-04-17: qty 180, 90d supply, fill #0

## 2024-04-18 ENCOUNTER — Other Ambulatory Visit (HOSPITAL_BASED_OUTPATIENT_CLINIC_OR_DEPARTMENT_OTHER): Payer: Self-pay

## 2024-04-19 ENCOUNTER — Other Ambulatory Visit (HOSPITAL_BASED_OUTPATIENT_CLINIC_OR_DEPARTMENT_OTHER): Payer: Self-pay

## 2024-04-24 ENCOUNTER — Other Ambulatory Visit (HOSPITAL_BASED_OUTPATIENT_CLINIC_OR_DEPARTMENT_OTHER): Payer: Self-pay

## 2024-04-25 ENCOUNTER — Telehealth: Payer: Self-pay | Admitting: Neurology

## 2024-04-25 NOTE — Telephone Encounter (Signed)
 Have not receive form from pharmacy

## 2024-04-25 NOTE — Telephone Encounter (Signed)
°  Copied from CRM [id number]. Topic: Clinical - Prescription Issue >> Apr 24, 2024  3:49 PM Macario HERO wrote: Reason for CRM: Niels from [facility] called said they are not able to change medication to a different generic  medication for levothyroxine  (SYNTHROID ) 50 MCG tablet [[id number]] - they sent over a fax and are requesting we sign it and send it back.

## 2024-05-06 ENCOUNTER — Other Ambulatory Visit: Payer: Self-pay | Admitting: Family

## 2024-05-06 DIAGNOSIS — E119 Type 2 diabetes mellitus without complications: Secondary | ICD-10-CM

## 2024-05-07 ENCOUNTER — Other Ambulatory Visit (HOSPITAL_BASED_OUTPATIENT_CLINIC_OR_DEPARTMENT_OTHER): Payer: Self-pay

## 2024-05-07 MED ORDER — MOUNJARO 10 MG/0.5ML ~~LOC~~ SOAJ
10.0000 mg | SUBCUTANEOUS | 0 refills | Status: AC
  Filled 2024-05-07: qty 2, 28d supply, fill #0
  Filled 2024-06-03: qty 2, 28d supply, fill #1

## 2024-06-03 ENCOUNTER — Other Ambulatory Visit: Payer: Self-pay | Admitting: Family

## 2024-06-03 ENCOUNTER — Other Ambulatory Visit (HOSPITAL_COMMUNITY): Payer: Self-pay

## 2024-06-03 DIAGNOSIS — I10 Essential (primary) hypertension: Secondary | ICD-10-CM

## 2024-06-03 MED ORDER — METOPROLOL SUCCINATE ER 25 MG PO TB24
25.0000 mg | ORAL_TABLET | Freq: Every day | ORAL | 0 refills | Status: AC
  Filled 2024-06-03: qty 90, 90d supply, fill #0

## 2024-06-03 NOTE — Telephone Encounter (Signed)
 Please contact pt to schedule a follow up visit.

## 2024-06-04 ENCOUNTER — Other Ambulatory Visit (HOSPITAL_COMMUNITY): Payer: Self-pay

## 2024-06-04 ENCOUNTER — Other Ambulatory Visit: Payer: Self-pay

## 2024-06-04 NOTE — Telephone Encounter (Signed)
 Pt states she will schedule on mychart

## 2024-06-05 ENCOUNTER — Other Ambulatory Visit: Payer: Self-pay

## 2024-06-10 ENCOUNTER — Other Ambulatory Visit: Payer: Self-pay | Admitting: Family

## 2024-06-10 DIAGNOSIS — E785 Hyperlipidemia, unspecified: Secondary | ICD-10-CM

## 2024-06-11 ENCOUNTER — Other Ambulatory Visit (HOSPITAL_COMMUNITY): Payer: Self-pay

## 2024-06-11 ENCOUNTER — Other Ambulatory Visit: Payer: Self-pay

## 2024-06-11 MED ORDER — ATORVASTATIN CALCIUM 80 MG PO TABS
80.0000 mg | ORAL_TABLET | Freq: Every day | ORAL | 0 refills | Status: AC
  Filled 2024-06-11: qty 90, 90d supply, fill #0

## 2024-06-11 NOTE — Telephone Encounter (Signed)
 Please contact pt to schedule a follow up appointment.

## 2024-06-12 NOTE — Telephone Encounter (Signed)
 Lvm to sched

## 2024-07-31 ENCOUNTER — Ambulatory Visit: Admitting: Family
